# Patient Record
Sex: Female | Born: 1935 | Race: White | Hispanic: No | Smoking: Former smoker
Health system: Southern US, Community
[De-identification: ages and names within clinical notes are randomized; demographics above are authoritative.]

## PROBLEM LIST (undated history)

## (undated) DIAGNOSIS — N183 Chronic kidney disease, stage 3 unspecified: Secondary | ICD-10-CM

## (undated) DIAGNOSIS — M1990 Unspecified osteoarthritis, unspecified site: Secondary | ICD-10-CM

## (undated) DIAGNOSIS — R7303 Prediabetes: Secondary | ICD-10-CM

## (undated) DIAGNOSIS — E785 Hyperlipidemia, unspecified: Secondary | ICD-10-CM

## (undated) DIAGNOSIS — I10 Essential (primary) hypertension: Secondary | ICD-10-CM

## (undated) DIAGNOSIS — I639 Cerebral infarction, unspecified: Secondary | ICD-10-CM

## (undated) DIAGNOSIS — K219 Gastro-esophageal reflux disease without esophagitis: Secondary | ICD-10-CM

## (undated) DIAGNOSIS — I4891 Unspecified atrial fibrillation: Secondary | ICD-10-CM

## (undated) DIAGNOSIS — I5032 Chronic diastolic (congestive) heart failure: Secondary | ICD-10-CM

## (undated) HISTORY — PX: RHINOPLASTY: SUR1284

## (undated) HISTORY — DX: Cerebral infarction, unspecified: I63.9

## (undated) HISTORY — DX: Unspecified atrial fibrillation: I48.91

## (undated) HISTORY — DX: Prediabetes: R73.03

## (undated) HISTORY — DX: Chronic kidney disease, stage 3 (moderate): N18.3

## (undated) HISTORY — DX: Hyperlipidemia, unspecified: E78.5

## (undated) HISTORY — DX: Unspecified osteoarthritis, unspecified site: M19.90

## (undated) HISTORY — DX: Gastro-esophageal reflux disease without esophagitis: K21.9

## (undated) HISTORY — DX: Essential (primary) hypertension: I10

## (undated) HISTORY — PX: CHOLECYSTECTOMY: SHX55

## (undated) HISTORY — DX: Chronic kidney disease, stage 3 unspecified: N18.30

## (undated) HISTORY — PX: OTHER SURGICAL HISTORY: SHX169

---

## 2018-01-25 NOTE — Progress Notes (Signed)
Referring-Self referral Reason for referral-Atrial fibrillation  HPI: 84 yo female for evaluation of atrial fibrillation (self referral).  Echocardiogram January 2019 showed normal LV function, mild mitral regurgitation, moderate left atrial enlargement and mild tricuspid regurgitation.  Patient was off of Eliquis for hip injection in January 2019 and suffered an embolic CVA.  Patient denies dyspnea, chest pain, palpitations, syncope or bleeding.  She has some gait instability from previous CVA.  Current Outpatient Medications  Medication Sig Dispense Refill  . Acetaminophen (TYLENOL 8 HOUR PO) Take 650 mg by mouth 2 (two) times daily.    Marland Kitchen. apixaban (ELIQUIS) 5 MG TABS tablet Take 5 mg by mouth 2 (two) times daily.    Marland Kitchen. escitalopram (LEXAPRO) 20 MG tablet Take 20 mg by mouth daily.    Marland Kitchen. gabapentin (NEURONTIN) 100 MG capsule Take 200 mg by mouth 2 (two) times daily.    Marland Kitchen. losartan (COZAAR) 50 MG tablet Take 50 mg by mouth daily.    . metoprolol tartrate (LOPRESSOR) 50 MG tablet Take 50 mg by mouth 2 (two) times daily.    . Multiple Vitamin (MULTIVITAMIN) tablet Take 1 tablet by mouth daily.    . Probiotic Product (PROBIOTIC DAILY PO) Take 10 mg by mouth daily.    . ranitidine (ZANTAC) 150 MG tablet Take 150 mg by mouth daily.    . rosuvastatin (CRESTOR) 10 MG tablet Take 5 mg by mouth 2 (two) times a week.    . traMADol (ULTRAM) 50 MG tablet Take 50 mg by mouth daily.     No current facility-administered medications for this visit.     Allergies  Allergen Reactions  . Penicillins Anaphylaxis and Swelling    Local swelling, throat swelling Local swelling, throat swelling   . Sulfa Antibiotics Anaphylaxis and Hives    Hives Hives      Past Medical History:  Diagnosis Date  . Atrial fibrillation (HCC)   . CVA (cerebral vascular accident) (HCC)   . Hyperlipidemia   . Hypertension     Past Surgical History:  Procedure Laterality Date  . CHOLECYSTECTOMY      Social  History   Socioeconomic History  . Marital status: Widowed    Spouse name: Not on file  . Number of children: 3  . Years of education: Not on file  . Highest education level: Not on file  Occupational History  . Not on file  Social Needs  . Financial resource strain: Not on file  . Food insecurity:    Worry: Not on file    Inability: Not on file  . Transportation needs:    Medical: Not on file    Non-medical: Not on file  Tobacco Use  . Smoking status: Former Games developermoker  . Smokeless tobacco: Never Used  Substance and Sexual Activity  . Alcohol use: Not Currently  . Drug use: Not on file  . Sexual activity: Not on file  Lifestyle  . Physical activity:    Days per week: Not on file    Minutes per session: Not on file  . Stress: Not on file  Relationships  . Social connections:    Talks on phone: Not on file    Gets together: Not on file    Attends religious service: Not on file    Active member of club or organization: Not on file    Attends meetings of clubs or organizations: Not on file    Relationship status: Not on file  . Intimate partner violence:  Fear of current or ex partner: Not on file    Emotionally abused: Not on file    Physically abused: Not on file    Forced sexual activity: Not on file  Other Topics Concern  . Not on file  Social History Narrative  . Not on file    Family History  Problem Relation Age of Onset  . Stomach cancer Mother   . CAD Father     ROS: Hip pain but no fevers or chills, productive cough, hemoptysis, dysphasia, odynophagia, melena, hematochezia, dysuria, hematuria, rash, seizure activity, orthopnea, PND, pedal edema, claudication. Remaining systems are negative.  Physical Exam:   Blood pressure (!) 158/78, pulse (!) 58, height 4\' 11"  (1.499 m), weight 144 lb (65.3 kg).  General:  Well developed/well nourished in NAD Skin warm/dry Patient not depressed No peripheral clubbing Back-normal HEENT-normal/normal  eyelids Neck supple/normal carotid upstroke bilaterally; no bruits; no JVD; no thyromegaly chest - CTA/ normal expansion CV - irregular/normal S1 and S2; no murmurs, rubs or gallops;  PMI nondisplaced Abdomen -NT/ND, no HSM, no mass, + bowel sounds, no bruit 2+ femoral pulses, no bruits Ext-no edema, chords, 2+ DP Neuro-grossly nonfocal  ECG -atrial fibrillation at a rate of 58.  RV conduction delay.  Nonspecific ST changes.  Personally reviewed  A/P  1 permanent atrial fibrillation-plan to continue metoprolol for rate control.  Her heart rate is 58 and we may need to reduce the dose in the future.  Continue apixaban.  She had recent laboratories performed in November 2019.  Creatinine 1.17.  Hemoglobin 13.8.  Patient had an embolic CVA when off of Eliquis.  She also has hip pain.  She may require injections or hip replacement in the future.  We would need to consider Lovenox bridge if she needs these procedures.  2 hypertension-patient's blood pressure is elevated.  I have asked her to follow this and we will advance medications as needed.  3 hyperlipidemia-continue statin.  4 hip pain-we will leave to internal medicine.  As outlined above she requires procedures in the future she will probably need Lovenox bridge while off of apixaban.  Olga MillersBrian Crenshaw, MD

## 2018-02-04 ENCOUNTER — Ambulatory Visit: Payer: Medicare HMO | Admitting: Cardiology

## 2018-02-04 ENCOUNTER — Encounter: Payer: Self-pay | Admitting: Cardiology

## 2018-02-04 VITALS — BP 158/78 | HR 58 | Ht 59.0 in | Wt 144.0 lb

## 2018-02-04 DIAGNOSIS — I10 Essential (primary) hypertension: Secondary | ICD-10-CM

## 2018-02-04 DIAGNOSIS — I4821 Permanent atrial fibrillation: Secondary | ICD-10-CM | POA: Diagnosis not present

## 2018-02-04 DIAGNOSIS — E7800 Pure hypercholesterolemia, unspecified: Secondary | ICD-10-CM

## 2018-02-04 NOTE — Patient Instructions (Signed)
Medication Instructions:  NO CHANGE If you need a refill on your cardiac medications before your next appointment, please call your pharmacy.   Lab work: If you have labs (blood work) drawn today and your tests are completely normal, you will receive your results only by: Marland Kitchen. MyChart Message (if you have MyChart) OR . A paper copy in the mail If you have any lab test that is abnormal or we need to change your treatment, we will call you to review the results.  Follow-Up: At [facility], you and your health needs are our priority.  As part of our continuing mission to provide you with exceptional heart care, we have created designated Provider Care Teams.  These Care Teams include your primary Cardiologist (physician) and Advanced Practice Providers (APPs -  Physician Assistants and Nurse Practitioners) who all work together to provide you with the care you need, when you need it. You will need a follow up appointment in 6 months.  Please call our office 2 months in advance to schedule this appointment.  You WILL see Olga MillersBRIAN CRENSHAW MD  IN [place]

## 2018-02-06 NOTE — Addendum Note (Signed)
Addended by: Raelyn Number on: 02/06/2018 04:49 PM   Modules accepted: Orders

## 2018-03-19 ENCOUNTER — Ambulatory Visit (INDEPENDENT_AMBULATORY_CARE_PROVIDER_SITE_OTHER): Payer: Medicare HMO

## 2018-03-19 ENCOUNTER — Ambulatory Visit (INDEPENDENT_AMBULATORY_CARE_PROVIDER_SITE_OTHER): Payer: Medicare HMO | Admitting: Orthopaedic Surgery

## 2018-03-19 DIAGNOSIS — M25552 Pain in left hip: Secondary | ICD-10-CM | POA: Diagnosis not present

## 2018-03-19 DIAGNOSIS — M1611 Unilateral primary osteoarthritis, right hip: Secondary | ICD-10-CM | POA: Diagnosis not present

## 2018-03-19 DIAGNOSIS — M1990 Unspecified osteoarthritis, unspecified site: Secondary | ICD-10-CM | POA: Insufficient documentation

## 2018-03-19 DIAGNOSIS — M25551 Pain in right hip: Secondary | ICD-10-CM | POA: Diagnosis not present

## 2018-03-19 DIAGNOSIS — M169 Osteoarthritis of hip, unspecified: Secondary | ICD-10-CM | POA: Insufficient documentation

## 2018-03-19 MED ORDER — TRAMADOL HCL 50 MG PO TABS: 50.0000 mg | ORAL_TABLET | Freq: Four times a day (QID) | ORAL | 0 refills | Status: DC | PRN

## 2018-03-19 NOTE — Progress Notes (Signed)
Office Visit Note   Patient: Lorraine Page           Date of Birth: Oct 01, 1932           MRN: [id number] Visit Date: 03/19/2018              Requested by: Brooke Bonito, MD No address on file PCP: Brooke Bonito, MD   Assessment & Plan: Visit Diagnoses:  1. Pain in left hip   2. Pain in right hip   3. Unilateral primary osteoarthritis, right hip     Plan: She understands fully that she does have severe end-stage arthritis of her right hip and there is not really any good treatment options at this standpoint.  I would not even recommend an intra-articular injection given the significance of her arthritis and not seen intra-articular injections in this setting make a hip significantly worse with femoral head collapse.  Based on how severe her disease is on her right hip I do not feel that this is prudent.  I still feel that she is a candidate from my standpoint for hip replacement surgery.  I absolutely understand the hesitance for her and her family do want her to proceed with this.  They will think about this.  From my standpoint I would have her stop Eliquis 4 days before surgery and have her be bridged with Lovenox.  That way there is the potential for spinal anesthesia as well and anything we can do to minimize the impact of surgery on her.  I do feel this is a quality of life issue for her.  I would certainly also perform her surgery over at [facility] since she has a good relationship with her cardiologist and they would be able to check in on her if needed.  All question concerns were answered and addressed.  I would like to see her back in about a week so I can answer any more questions for her and we can have a better understanding for whether or not they would like [place] to proceed with surgery.  Follow-Up Instructions: Return in about 1 week (around 03/26/2018).   Orders:  Orders Placed This Encounter  Procedures  . XR HIPS BILAT W OR W/O PELVIS 2V   Meds ordered this  encounter  Medications  . traMADol (ULTRAM) 50 MG tablet    Sig: Take 1-2 tablets (50-100 mg total) by mouth every 6 (six) hours as needed.    Dispense:  40 tablet    Refill:  0      Procedures: No procedures performed   Clinical Data: No additional findings.   Subjective: Chief Complaint  Patient presents with  . Right Hip - Pain  Patient is a very pleasant 85 year old female that I am seeing for the first time.  She is been having right hip pain for some time now.  Her hip pain is actually getting significantly worse especially in the groin.  She does ambulate with a cane.  At this point her right hip pain is detrimentally affected her mobility, her quality of life and her activities daily living.  She does take tramadol for her pain.  Things have been slowly getting worse for her.  She does use a quad cane to ambulate but gets around minimally due to this pain.  Apparently she was going to get some type of injection in her hip and for some reason they stopped Eliquis.  She was off Eliquis for what she said was too long  and had a stroke.  Now it is 1 year later and she was to see if there is anything that she can do for her right hip.  Her family is with her today.  She also has a good relationship with her cardiologist and there is a family member who is a Advice worker for Dr. Excell Seltzer 1 of the cardiologist.  She also sees Dr. Olga Millers.  She said she is been told that she is not a surgical candidate due to her issues.  She is still on Eliquis daily.  HPI  Review of Systems She currently denies any headache, chest pain, shortness of breath, fever, chills, nausea, vomiting.  Objective: Vital Signs: There were no vitals taken for this visit.  Physical Exam She is alert and orient x3 and in no acute distress Ortho Exam Examination of her left hip just shows minimal pain but full rotation with no significant issues of the left hip.  The right hip has significant limitations  in rotation.  She has severe pain when put her through internal and external rotation of her right hip. Specialty Comments:  No specialty comments available.  Imaging: Xr Hips Bilat W Or W/o Pelvis 2v  Result Date: 03/19/2018 An AP pelvis and lateral of the right hip show severe end-stage arthritis of the right hip.  There is complete loss of the joint space.  There is significant flattening of the femoral head.  There is cystic changes in the femoral head and acetabulum as well as para-articular osteophytes.    PMFS History: Patient Active Problem List   Diagnosis Date Noted  . Unilateral primary osteoarthritis, right hip 03/19/2018   Past Medical History:  Diagnosis Date  . Atrial fibrillation (HCC)   . Chronic kidney disease, stage 3 (HCC)   . CVA (cerebral vascular accident) (HCC)   . GERD (gastroesophageal reflux disease)   . Hyperlipidemia   . Hypertension     Family History  Problem Relation Age of Onset  . Stomach cancer Mother   . CAD Father     Past Surgical History:  Procedure Laterality Date  . CHOLECYSTECTOMY     Social History   Occupational History  . Not on file  Tobacco Use  . Smoking status: Former Games developer  . Smokeless tobacco: Never Used  Substance and Sexual Activity  . Alcohol use: Not Currently  . Drug use: Not on file  . Sexual activity: Not on file

## 2018-03-25 ENCOUNTER — Telehealth: Payer: Self-pay | Admitting: Pharmacist Clinician (PhC)/ Clinical Pharmacy Specialist

## 2018-03-25 NOTE — Telephone Encounter (Signed)
FW: surgery  Received: Today  Message Contents  Freddi Starr, RN  P Cv Div Nl Anticoag      Previous Messages    ----- Message -----  From: Lewayne Bunting, MD  Sent: 03/25/2018  5:15 AM EST  To: Freddi Starr, RN, Beatrice Lecher, PA-C  Subject: RE: surgery                    Will arrange lovenox bridge. She didn't tell me about CP. Given spinal could probably proceed without nuc. Stanton Kidney can you arrange with pharmacy?  BC  ----- Message -----  From: Kennon Rounds  Sent: 03/23/2018  3:54 PM EST  To: Lewayne Bunting, MD  Subject: surgery                      Valente David  I know you are on vacation but I wasn't sure if you would be looking at your IB.  My mom saw Dr. Allie Bossier for her hip. She has pretty bad arthritis on that side and he did not have anything to offer her other than surgery.   He does an anterior approach for hip replacements and uses spinal anesthesia. He told us the procedure takes about an hour and patients are usually able to get up pretty quickly after b/c he doesn't cut through any muscle.   I talked to [facility] and he has had a lot of patients see Dr. Magnus Ivan that had a lot of comorbid illnesses and he said they did really well with this procedure. Dr. Magnus Ivan also said Jesusita Oka has had a lot of patients sent his way.    She is pretty miserable so I think it is worth it.  Dr. Magnus Ivan said anesthesia always asks for 4 days off anticoagulation for spinals.   # - Do you think we can set her up for Lovenox while she is off Eliquis through our [facility]? I could have her see Tresa Endo or Aundra Millet at [facility] since I am over there.   # - Also, do you think she needs a nuc? I would think that spinal anesthesia should lower her risk and it may not be necessary. She has had word finding issues since her stroke. She tells me sometimes she has chest pain but it sounds pretty atypical and I am not so sure she means that.      Thanks for your help.  Sorry about the long message.  Scott

## 2018-03-25 NOTE — Telephone Encounter (Signed)
Patient currently on Eliquis 5 mg bid (age 85, wt 65.3 kg, SCr 1.17 - dose appropriate)  CrCl 37.6  She can stop Eliquis 4 days prior to surgery and start enoxaparin 60 mg injections 12 hours later. Continue injections every 12 hours with last dose the morning prior to surgery (24 hrs prior to surgery).    Patient should be able to resume Eliquis day after surgery or at discretion of surgeon.  Please be sure patient is put back on full dose Eliquis and not orthopedic prophylactic dose

## 2018-03-26 NOTE — Telephone Encounter (Signed)
Spoke with Nicki Reaper, procedure date not set yet (she has not met with Dr Ninfa Linden yet). He will let us know when procedure date is scheduled and we will plan to see pt 1 week prior to procedure to coordinate Lovenox bridge.

## 2018-04-03 ENCOUNTER — Encounter (INDEPENDENT_AMBULATORY_CARE_PROVIDER_SITE_OTHER): Payer: Self-pay | Admitting: Orthopaedic Surgery

## 2018-04-03 ENCOUNTER — Ambulatory Visit (INDEPENDENT_AMBULATORY_CARE_PROVIDER_SITE_OTHER): Payer: Medicare HMO | Admitting: Orthopaedic Surgery

## 2018-04-03 DIAGNOSIS — M1611 Unilateral primary osteoarthritis, right hip: Secondary | ICD-10-CM

## 2018-04-03 NOTE — Progress Notes (Signed)
The patient is being seen today again but this time with her son-in-law who is a Advice worker with cardiology.  She is someone with severe debilitating end-stage arthritis of her right hip that is well-documented her x-rays.  We went over her x-rays again today showing the complete loss of the joint space of the right hip.  The left hip appears normal.  The right hip has severe cystic changes in the femoral head and the acetabulum with loss of the joint space completely.  Her pain is daily and it is detrimentally affected her mobility, her quality of life and her activities daily living.  It is 10 out of 10.  Even motion of that hip causes severe pain.  She is someone who is on Eliquis.  Her son-in-law is with her today as well.  We have now a plan with clearance through cardiology for the surgery.  When she has been off Eliquis in the past for procedure she was not bridged with Lovenox and unfortunately had a stroke about 2 days after being off Eliquis.  For this surgery we would have her bridged with Lovenox.  At this point they do feel is appropriate we will proceed with this surgery as do I.  We would need to do this on a Tuesday afternoon at [facility] since that is easier access to the cardiologist if needed.  We would have her stop her Eliquis on Friday before the Tuesday surgery and be bridged with Lovenox.  Her son-in-law stated that they can set this up through the pharmacist at the hospital as well.  We went over in detail with the surgery involves again.  We talked about interoperative and postoperative course and the risk and benefits involved.  Please refer to our last clinic note as well as it relates to this patient and her significant hip disease.  All question concerns were answered and addressed.  We will work on getting the surgery scheduled hopefully in the near future.

## 2018-04-11 NOTE — Telephone Encounter (Signed)
Procedure scheduled for 05/20/18. Will call to schedule appt to go over lovenox.

## 2018-04-15 NOTE — Telephone Encounter (Signed)
Spoke with patient's daughter and scheduled appt

## 2018-04-27 ENCOUNTER — Other Ambulatory Visit (INDEPENDENT_AMBULATORY_CARE_PROVIDER_SITE_OTHER): Payer: Self-pay | Admitting: Orthopaedic Surgery

## 2018-04-28 NOTE — Telephone Encounter (Signed)
Please advise 

## 2018-05-12 ENCOUNTER — Ambulatory Visit: Payer: Medicare HMO

## 2018-05-26 ENCOUNTER — Other Ambulatory Visit (INDEPENDENT_AMBULATORY_CARE_PROVIDER_SITE_OTHER): Payer: Self-pay | Admitting: Orthopaedic Surgery

## 2018-05-26 NOTE — Telephone Encounter (Signed)
Please advise 

## 2018-06-03 ENCOUNTER — Encounter: Payer: Self-pay | Admitting: Cardiology

## 2018-06-03 NOTE — Telephone Encounter (Signed)
New message   Patient returning phone call about appointment please call her back

## 2018-06-04 NOTE — Telephone Encounter (Signed)
This encounter was created in error - please disregard.

## 2018-06-04 NOTE — Telephone Encounter (Signed)
Left message for pt to call.

## 2018-06-09 ENCOUNTER — Other Ambulatory Visit (INDEPENDENT_AMBULATORY_CARE_PROVIDER_SITE_OTHER): Payer: Self-pay | Admitting: Orthopaedic Surgery

## 2018-06-09 NOTE — Telephone Encounter (Signed)
Please advise 

## 2018-06-16 ENCOUNTER — Other Ambulatory Visit (INDEPENDENT_AMBULATORY_CARE_PROVIDER_SITE_OTHER): Payer: Self-pay | Admitting: Orthopaedic Surgery

## 2018-06-16 NOTE — Telephone Encounter (Signed)
Please advise 

## 2018-06-30 ENCOUNTER — Other Ambulatory Visit (INDEPENDENT_AMBULATORY_CARE_PROVIDER_SITE_OTHER): Payer: Self-pay | Admitting: Orthopaedic Surgery

## 2018-07-01 NOTE — Telephone Encounter (Signed)
Please advise 

## 2018-07-08 ENCOUNTER — Telehealth: Payer: Self-pay | Admitting: *Deleted

## 2018-07-08 NOTE — Telephone Encounter (Signed)
There is not a higher dose.  We can send in more.  Can not send it in until later this week.  Call back as a reminder.

## 2018-07-08 NOTE — Telephone Encounter (Signed)
Please advise 

## 2018-07-08 NOTE — Telephone Encounter (Signed)
Pt is taking tramadol 100mg  in the morning and 100mg  in the evening. She is running out of medication in 10 days. Pt daughter is wondering if we can either increase the amount of mg or call in more medication. Pharmacy is [facility].

## 2018-07-10 ENCOUNTER — Other Ambulatory Visit: Payer: Self-pay | Admitting: Orthopaedic Surgery

## 2018-07-10 MED ORDER — TRAMADOL HCL 50 MG PO TABS: 50.0000 mg | ORAL_TABLET | Freq: Four times a day (QID) | ORAL | 0 refills | Status: DC | PRN

## 2018-07-10 NOTE — Telephone Encounter (Signed)
Needs refill of Tramadol  The below post was supposed to say "it only last 10 days" Daughter states she is in a lot of pain and is very ready and anxious to get replacement done

## 2018-07-10 NOTE — Telephone Encounter (Signed)
I did send in some more tramadol.  See if you can find out from Consuello Bossier about getting this patient scheduled for hip replacement.  She can look at my last note from February.  I probably already filled a schedule sheet for her as well and its somehow in Sherry's system.

## 2018-07-11 NOTE — Telephone Encounter (Signed)
I called daughter and advised tramadol was sent to the pharmacy.   Cheryl-please see message from Dr. Magnus Ivan below.

## 2018-07-14 NOTE — Telephone Encounter (Signed)
Lorraine Page, you have surgery order for Lorraine Page.

## 2018-07-17 ENCOUNTER — Telehealth: Payer: Self-pay

## 2018-07-17 NOTE — Telephone Encounter (Signed)
Virtual Visit Pre-Appointment Phone Call  "(Name), I am calling you today to discuss your upcoming appointment. We are currently trying to limit exposure to the virus that causes COVID-19 by seeing patients at home rather than in the office."  1. "What is the BEST phone number to call the day of the visit?" - include this in appointment notes  2. "Do you have or have access to (through a family member/friend) a smartphone with video capability that we can use for your visit?" a. If yes - list this number in appt notes as "cell" (if different from BEST phone #) and list the appointment type as a VIDEO visit in appointment notes b. If no - list the appointment type as a PHONE visit in appointment notes  3. Confirm consent - "In the setting of the current Covid19 crisis, you are scheduled for a (phone or video) visit with your provider on (date) at (time).  Just as we do with many in-office visits, in order for you to participate in this visit, we must obtain consent.  If you'd like, I can send this to your mychart (if signed up) or email for you to review.  Otherwise, I can obtain your verbal consent now.  All virtual visits are billed to your insurance company just like a normal visit would be.  By agreeing to a virtual visit, we'd like you to understand that the technology does not allow for your provider to perform an examination, and thus may limit your provider's ability to fully assess your condition. If your provider identifies any concerns that need to be evaluated in person, we will make arrangements to do so.  Finally, though the technology is pretty good, we cannot assure that it will always work on either your or our end, and in the setting of a video visit, we may have to convert it to a phone-only visit.  In either situation, we cannot ensure that we have a secure connection.  Are you willing to proceed?" STAFF: Did the patient verbally acknowledge consent to telehealth visit? Document  YES/NO here: YES  4. Advise patient to be prepared - "Two hours prior to your appointment, go ahead and check your blood pressure, pulse, oxygen saturation, and your weight (if you have the equipment to check those) and write them all down. When your visit starts, your provider will ask you for this information. If you have an Apple Watch or Kardia device, please plan to have heart rate information ready on the day of your appointment. Please have a pen and paper handy nearby the day of the visit as well."  5. Give patient instructions for MyChart download to smartphone OR Doximity/Doxy.me as below if video visit (depending on what platform provider is using)  6. Inform patient they will receive a phone call 15 minutes prior to their appointment time (may be from unknown caller ID) so they should be prepared to answer    TELEPHONE CALL NOTE  Lorraine Page has been deemed a candidate for a follow-up tele-health visit to limit community exposure during the Covid-19 pandemic. I spoke with the patient via phone to ensure availability of phone/video source, confirm preferred email & phone number, and discuss instructions and expectations.  I reminded Lorraine Page to be prepared with any vital sign and/or heart rhythm information that could potentially be obtained via home monitoring, at the time of her visit. I reminded Lorraine Page to expect a phone call prior to her visit.  Lorraine Page, CMA 07/17/2018 10:54 AM   INSTRUCTIONS FOR DOWNLOADING THE MYCHART APP TO SMARTPHONE  - The patient must first make sure to have activated MyChart and know their login information - If Apple, go to [employer] Store and type in MyChart in the search bar and download the app. If Android, ask patient to go to [employer] Play Store and type in [place] in the search bar and download the app. The app is free but as with any other app downloads, their phone may require them to verify saved payment information or  Apple/Android password.  - The patient will need to then log into the app with their MyChart username and password, and select [place] as their healthcare provider to link the account. When it is time for your visit, go to the MyChart app, find appointments, and click Begin Video Visit. Be sure to Select Allow for your device to access the Microphone and Camera for your visit. You will then be connected, and your provider will be with you shortly.  **If they have any issues connecting, or need assistance please contact MyChart service desk [phone])**  **If using a computer, in order to ensure the best quality for their visit they will need to use either of the following Internet Browsers: [employer] Edge, or Google Chrome**  IF USING DOXIMITY or DOXY.ME - The patient will receive a link just prior to their visit by text.     FULL LENGTH CONSENT FOR TELE-HEALTH VISIT   I hereby voluntarily request, consent and authorize [facility] and its employed or contracted physicians, physician assistants, nurse practitioners or other licensed health care professionals (the Practitioner), to provide me with telemedicine health care services (the "Services") as deemed necessary by the treating Practitioner. I acknowledge and consent to receive the Services by the Practitioner via telemedicine. I understand that the telemedicine visit will involve communicating with the Practitioner through live audiovisual communication technology and the disclosure of certain medical information by electronic transmission. I acknowledge that I have been given the opportunity to request an in-person assessment or other available alternative prior to the telemedicine visit and am voluntarily participating in the telemedicine visit.  I understand that I have the right to withhold or withdraw my consent to the use of telemedicine in the course of my care at any time, without affecting my right to future care  or treatment, and that the Practitioner or I may terminate the telemedicine visit at any time. I understand that I have the right to inspect all information obtained and/or recorded in the course of the telemedicine visit and may receive copies of available information for a reasonable fee.  I understand that some of the potential risks of receiving the Services via telemedicine include:  Marland Kitchen. Delay or interruption in medical evaluation due to technological equipment failure or disruption; . Information transmitted may not be sufficient (e.g. poor resolution of images) to allow for appropriate medical decision making by the Practitioner; and/or  . In rare instances, security protocols could fail, causing a breach of personal health information.  Furthermore, I acknowledge that it is my responsibility to provide information about my medical history, conditions and care that is complete and accurate to the best of my ability. I acknowledge that Practitioner's advice, recommendations, and/or decision may be based on factors not within their control, such as incomplete or inaccurate data provided by me or distortions of diagnostic images or specimens that may result from electronic transmissions. I understand that the  practice of medicine is not an Chief Strategy Officer and that Practitioner makes no warranties or guarantees regarding treatment outcomes. I acknowledge that I will receive a copy of this consent concurrently upon execution via email to the email address I last provided but may also request a printed copy by calling the office of [place].    I understand that my insurance will be billed for this visit.   I have read or had this consent read to me. . I understand the contents of this consent, which adequately explains the benefits and risks of the Services being provided via telemedicine.  . I have been provided ample opportunity to ask questions regarding this consent and the Services and have had  my questions answered to my satisfaction. . I give my informed consent for the services to be provided through the use of telemedicine in my medical care  By participating in this telemedicine visit I agree to the above.

## 2018-07-22 NOTE — Progress Notes (Signed)
Virtual Visit via telephone Note   This visit type was conducted due to national recommendations for restrictions regarding the COVID-19 Pandemic (e.g. social distancing) in an effort to limit this patient's exposure and mitigate transmission in our community.  Due to her co-morbid illnesses, this patient is at least at moderate risk for complications without adequate follow up.  This format is felt to be most appropriate for this patient at this time.  All issues noted in this document were discussed and addressed.  A limited physical exam was performed with this format.  Please refer to the patient's chart for her consent to telehealth for [facility].   Date:  07/23/2018   ID:  Lorraine Page, DOB 01/09/1933, MRN [id number]  Patient Location: Home Provider Location: Home  PCP:  Reita Cliche, MD  Cardiologist:  Dr Stanford Breed  Evaluation Performed:  Follow-Up Visit  Chief Complaint:  FU atrial fibrillation  History of Present Illness:    FU atrial fibrillation.  Echocardiogram January 2019 showed normal LV function, mild mitral regurgitation, moderate left atrial enlargement and mild tricuspid regurgitation.  Patient was off of Eliquis for hip injection in January 2019 and suffered an embolic CVA. She has some gait instability from previous CVA. Since last seen, patient denies dyspnea, chest pain, syncope or bleeding.  She has significant mobility problems from hip pain.  The patient does not have symptoms concerning for COVID-19 infection (fever, chills, cough, or new shortness of breath).    Past Medical History:  Diagnosis Date  . Atrial fibrillation ([place])   . Chronic kidney disease, stage 3 ([place])   . CVA (cerebral vascular accident) ([employer])   . GERD (gastroesophageal reflux disease)   . Hyperlipidemia   . Hypertension    Past Surgical History:  Procedure Laterality Date  . CHOLECYSTECTOMY       Current Meds  Medication Sig  . Acetaminophen (TYLENOL 8 HOUR PO) Take  650 mg by mouth 2 (two) times daily.  Marland Kitchen apixaban (ELIQUIS) 5 MG TABS tablet Take 5 mg by mouth 2 (two) times daily.  . Cimetidine (TAGAMET HB PO) Take 200 mg by mouth daily.  Marland Kitchen escitalopram (LEXAPRO) 20 MG tablet Take 20 mg by mouth daily.  Marland Kitchen losartan (COZAAR) 50 MG tablet Take 50 mg by mouth daily.  . metoprolol tartrate (LOPRESSOR) 50 MG tablet Take 50 mg by mouth 2 (two) times daily.  . Multiple Vitamin (MULTIVITAMIN) tablet Take 1 tablet by mouth daily.  . Probiotic Product (PROBIOTIC DAILY PO) Take 10 mg by mouth daily.  . rosuvastatin (CRESTOR) 10 MG tablet Take 5 mg by mouth 2 (two) times a week.  . traMADol (ULTRAM) 50 MG tablet Take 1-2 tablets (50-100 mg total) by mouth every 6 (six) hours as needed. for pain     Allergies:   Penicillins and Sulfa antibiotics   Social History   Tobacco Use  . Smoking status: Former Research scientist (life sciences)  . Smokeless tobacco: Never Used  Substance Use Topics  . Alcohol use: Not Currently  . Drug use: Not on file     Family Hx: The patient's family history includes CAD in her father; Stomach cancer in her mother.  ROS:   Please see the history of present illness.    No fevers, chills or productive cough. All other systems reviewed and are negative.   Wt Readings from Last 3 Encounters:  07/23/18 142 lb (64.4 kg)  02/04/18 144 lb (65.3 kg)     Objective:    Vital Signs:  BP (!) 148/92   Pulse 65   Ht 4\' 11"  (1.499 m)   Wt 142 lb (64.4 kg)   BMI 28.68 kg/m    VITAL SIGNS:  reviewed  No acute distress Answers questions appropriately Normal affect Remainder physical examination not performed (telehealth visit; coronavirus pandemic)  ASSESSMENT & PLAN:    1. Permanent atrial fibrillation-we will continue with present dose of beta-blocker for rate control.  Continue apixaban.  Note she did have a CVA when her apixaban was discontinued previously.  As outlined in previous notes we will plan Lovenox bridge for her upcoming hip surgery and any  procedures in the future while off of Eliquis.  I will obtain most recent CBC and creatinine from primary care. 2. Hypertension-blood pressure is elevated; increase losartan to 100 mg daily.  Follow blood pressure and adjust regimen as needed.  Check potassium and renal function in 1 week. 3. Hyperlipidemia-continue statin. 4. Hip pain-patient is scheduled for hip replacement in the near future.  Will need Lovenox bridge while off of apixaban as outlined above.  COVID-19 Education: The importance of social distancing was discussed today.  Time:   Today, I have spent 17 minutes with the patient with telehealth technology discussing the above problems.     Medication Adjustments/Labs and Tests Ordered: Current medicines are reviewed at length with the patient today.  Concerns regarding medicines are outlined above.   Tests Ordered: No orders of the defined types were placed in this encounter.   Medication Changes: No orders of the defined types were placed in this encounter.   Follow Up:  Virtual Visit or In Person in 6 month(s)  Signed, Olga MillersBrian Crenshaw, MD  07/23/2018 12:13 PM    [facility]

## 2018-07-23 ENCOUNTER — Telehealth: Payer: Self-pay | Admitting: Cardiology

## 2018-07-23 ENCOUNTER — Encounter: Payer: Self-pay | Admitting: Cardiology

## 2018-07-23 ENCOUNTER — Telehealth (INDEPENDENT_AMBULATORY_CARE_PROVIDER_SITE_OTHER): Payer: Medicare HMO | Admitting: Cardiology

## 2018-07-23 ENCOUNTER — Other Ambulatory Visit: Payer: Self-pay

## 2018-07-23 VITALS — BP 148/92 | HR 65 | Ht 59.0 in | Wt 142.0 lb

## 2018-07-23 DIAGNOSIS — E7800 Pure hypercholesterolemia, unspecified: Secondary | ICD-10-CM

## 2018-07-23 DIAGNOSIS — I4821 Permanent atrial fibrillation: Secondary | ICD-10-CM

## 2018-07-23 DIAGNOSIS — I10 Essential (primary) hypertension: Secondary | ICD-10-CM | POA: Diagnosis not present

## 2018-07-23 MED ORDER — LOSARTAN POTASSIUM 100 MG PO TABS: 100.0000 mg | ORAL_TABLET | Freq: Every day | ORAL | 3 refills | Status: DC

## 2018-07-23 NOTE — Patient Instructions (Signed)
Medication Instructions:   INCREASE LOSARTAN 100 MG ONCE DAILY= 2 OF THE 50 MG TABLETS ONCE DAILY  If you need a refill on your cardiac medications before your next appointment, please call your pharmacy.   Lab work: Your physician recommends that you return for lab work in: [place]   If you have labs (blood work) drawn today and your tests are completely normal, you will receive your results only by: Marland Kitchen MyChart Message (if you have MyChart) OR . A paper copy in the mail If you have any lab test that is abnormal or we need to change your treatment, we will call you to review the results.  Follow-Up: At [facility], you and your health needs are our priority.  As part of our continuing mission to provide you with exceptional heart care, we have created designated Provider Care Teams.  These Care Teams include your primary Cardiologist (physician) and Advanced Practice Providers (APPs -  Physician Assistants and Nurse Practitioners) who all work together to provide you with the care you need, when you need it. You will need a follow up appointment in 6 months.  Please call our office 2 months in advance to schedule this appointment.  You may see Kirk Ruths MD or one of the following Advanced Practice Providers on your designated Care Team:   Kerin Ransom, PA-C Roby Lofts, [place] . Sande Rives, PA-C

## 2018-07-23 NOTE — Telephone Encounter (Signed)
° °  [facility] Pre-operative Risk Assessment    Request for surgical clearance:  1. What type of surgery is being performed? Total hip replacement of R hip  2. When is this surgery scheduled? 08/12/18   3. What type of clearance is required (medical clearance vs. Pharmacy clearance to hold med vs. Both)?  pharmacy  4. Are there any medications that need to be held prior to surgery and how long? Patient will need a lovenox bridge  5. Practice name and name of physician performing surgery? Zollie Beckers, [place]  6. What is your office phone number: [phone]   7.   What is your office fax number: [phone]  8.   Anesthesia type (None, local, MAC, general) ?: spinal   Lorraine Page 07/23/2018, 4:26 PM  _________________________________________________________________   (provider comments below)

## 2018-07-23 NOTE — Telephone Encounter (Signed)
Spoke with daughter and rescheduled surgery for 08/12/18.  Called Dr. Jacalyn Lefevre office and left message with surgery information so that Lovenox bridge can be arranged.

## 2018-07-24 ENCOUNTER — Other Ambulatory Visit: Payer: Self-pay

## 2018-07-24 ENCOUNTER — Other Ambulatory Visit: Payer: Self-pay | Admitting: Physician Assistant

## 2018-07-24 NOTE — Telephone Encounter (Signed)
Patient with diagnosis of afib on Eliquis for anticoagulation.    Procedure: Total hip replacement of R hip Date of procedure: 08/12/2018  CHADS2-VASc score of 6  (CHF, HTN, AGE, DM2, stroke/tia x 2, CAD, AGE, female)  Per office protocol, patient can hold Elqiuis for 3 days prior to procedure.   Due to CVA off Eliquis in the past, patient will need lovenox bridge.  Patient will need a BMP and CBC prior to coordinating this.

## 2018-07-24 NOTE — Telephone Encounter (Signed)
   Primary Cardiologist: No primary care provider on file.  Chart reviewed as part of pre-operative protocol coverage. Given past medical history and time since last visit, based on ACC/AHA guidelines, Lorraine Page would be at acceptable risk for the planned procedure without further cardiovascular testing. Pt was seen by Dr. Stanford Breed 07/23/2018 for follow up and surgery was dicussed. It was noted per chart review that the patient did experience a CVA when her apixaban was discontinued previously.  As outlined in previous notes we will plan Lovenox bridge for her upcoming hip surgery and any procedures in the future while off of Eliquis.   Per pharmacy: Patient with diagnosis of afib on Eliquis for anticoagulation.    Procedure: Total hip replacement of R hip Date of procedure: 08/12/2018  CHADS2-VASc score of 6  (CHF, HTN, AGE, DM2, stroke/tia x 2, CAD, AGE, female)  Per office protocol, patient can hold Elqiuis for 3 days prior to procedure. Due to CVA off Eliquis in the past, patient will need lovenox bridge. Patient will need a BMP and CBC prior to coordinating this.  I will route this recommendation to the requesting party via Epic fax function and remove from pre-op pool.  Please call with questions.  Kathyrn Drown, NP 07/24/2018, 4:35 PM

## 2018-07-30 ENCOUNTER — Telehealth: Payer: Self-pay | Admitting: Pharmacist

## 2018-07-30 NOTE — Telephone Encounter (Addendum)
Referred by Richardson Dopp to coordinate Lovenox bridging for his mother prior to upcoming [place] on 08/12/18. She takes Eliquis but previously developed a stroke when she interrupted anticoagulation therapy.  Labs checked at PCP on 12/13/17: SCr 1.17, Hgb 13.8, plt 205  Actual body weight 64.5kg Ideal body weight 45kg Adjusted body weight 53kg - use this weight for CrCl. CrCl is 30.22mL/min - pt is borderline between needing 1mg /kg BID dosing or 1mg /kg once daily dosing for CrCl < 30.  Repeat BMET/CBC drawn on 6/30 for preop labs in order to calculate appropriate Lovenox frequency: CBC stable, SCr 1.04, CrCl using adjusted body weight = 35mL/min. Pt appropriate for 1mg k/kg BID dosing - this Lovenox dosing uses actual body weight - will dose at 60mg /kg BID.  7/3 - Last dose of Eliquis in PM 7/4 - Inject Lovenox 60mg  subcutaneously into abdominal tissue at 8AM and 8PM 7/5 - Inject Lovenox 60mg  at 8AM and 8PM 7/6 - Inject Lovenox 60mg  at 8AM. Do not administer PM dose. 7/7 - Procedure day - no anticoagulation 7/8 - Resume Eliquis at discretion of doctor  Called patient's daughter, Tye [place] [facility]) and provided above Lovenox  Instructions. Rx sent to pharmacy and advised her to call with any questions.

## 2018-07-31 ENCOUNTER — Telehealth: Payer: Self-pay | Admitting: Orthopaedic Surgery

## 2018-07-31 NOTE — Telephone Encounter (Signed)
Melissa from [facility] called wanted to advise needs labs to coordinate bridge perhaps these may be done in pre-op but wants a call [phone] BNP & CBC

## 2018-08-01 NOTE — Telephone Encounter (Signed)
The labs should be done at her pre-op appointment.  Please coordinate with Sherrie as well.

## 2018-08-01 NOTE — Telephone Encounter (Signed)
I called [phone]  and left a voice mail for a return call.  Put in orders for CBC and BMET.

## 2018-08-01 NOTE — Telephone Encounter (Signed)
Please advise 

## 2018-08-01 NOTE — Telephone Encounter (Signed)
See below

## 2018-08-04 ENCOUNTER — Other Ambulatory Visit (INDEPENDENT_AMBULATORY_CARE_PROVIDER_SITE_OTHER): Payer: Self-pay | Admitting: Orthopaedic Surgery

## 2018-08-04 NOTE — Pre-Procedure Instructions (Signed)
[facility] - [place], [place] - [id number] [place] [id number] Phone: [phone] Fax: [phone]      Your procedure is scheduled on 08-12-18 Tuesday  Report to [facility] Main Entrance "A" at 1215 P.M., and check in at the Admitting office.  Call this number if you have problems the morning of surgery:  [phone]  Call [phone] if you have any questions prior to your surgery date Monday-Friday 8am-4pm    Remember:  Do not eat after midnight.  You may drink clear liquids until 1115AM.   Clear liquids allowed are: Water, Non-Citrus Juices (without pulp), Carbonated Beverages, Clear Tea, Black Coffee Only, and Gatorade   Please complete your PRE-SURGERY ENSURE that was provided to you 3 hours prior to you surgery start time.  Please, if able, drink it in one setting. DO NOT SIP.  Take these medicines the morning of surgery with A SIP OF WATER : acetaminophen (TYLENOL 8 HOUR) cimetidine (TAGAMET HB) escitalopram (LEXAPRO) metoprolol tartrate (LOPRESSOR) rosuvastatin (CRESTOR)if scheduled traMADol (ULTRAM)as needed 7 days prior to surgery STOP taking any Aspirin (unless otherwise instructed by your surgeon), Aleve, Naproxen, Ibuprofen, Motrin, Advil, Goody's, BC's, all herbal medications, fish oil, and all vitamins.  Follow your surgeon's instructions on when to stop ELIQUIS.  If no instructions were given by your surgeon then you will need to call the office to get those instructions.     The Morning of Surgery  Do not wear jewelry, make-up or nail polish.  Do not wear lotions, powders, or perfumes/colognes, or deodorant  Do not shave 48 hours prior to surgery.  Men may shave face and neck.  Do not bring valuables to the hospital.  [facility] is not responsible for any belongings or valuables.  If you are a smoker, DO NOT Smoke 24 hours prior to surgery IF you wear a CPAP at night please bring your mask, tubing, and machine the  morning of surgery   Remember that you must have someone to transport you home after your surgery, and remain with you for 24 hours if you are discharged the same day.   Contacts, glasses, hearing aids, dentures or bridgework may not be worn into surgery.    Leave your suitcase in the car.  After surgery it may be brought to your room.  For patients admitted to the hospital, discharge time will be determined by your treatment team.  Patients discharged the day of surgery will not be allowed to drive home.    Special instructions:   [place]- Preparing For Surgery  Before surgery, you can play an important role. Because skin is not sterile, your skin needs to be as free of germs as possible. You can reduce the number of germs on your skin by washing with CHG (chlorahexidine gluconate) Soap before surgery.  CHG is an antiseptic cleaner which kills germs and bonds with the skin to continue killing germs even after washing.    Oral Hygiene is also important to reduce your risk of infection.  Remember - BRUSH YOUR TEETH THE MORNING OF SURGERY WITH YOUR REGULAR TOOTHPASTE  Please do not use if you have an allergy to CHG or antibacterial soaps. If your skin becomes reddened/irritated stop using the CHG.  Do not shave (including legs and underarms) for at least 48 hours prior to first CHG shower. It is OK to shave your face.  Please follow these instructions carefully.   1. Shower the NIGHT BEFORE  SURGERY and the MORNING OF SURGERY with CHG Soap.   2. If you chose to wash your hair, wash your hair first as usual with your normal shampoo.  3. After you shampoo, rinse your hair and body thoroughly to remove the shampoo.  4. Use CHG as you would any other liquid soap. You can apply CHG directly to the skin and wash gently with a scrungie or a clean washcloth.   5. Apply the CHG Soap to your body ONLY FROM THE NECK DOWN.  Do not use on open wounds or open sores. Avoid contact with your  eyes, ears, mouth and genitals (private parts). Wash Face and genitals (private parts)  with your normal soap.   6. Wash thoroughly, paying special attention to the area where your surgery will be performed.  7. Thoroughly rinse your body with warm water from the neck down.  8. DO NOT shower/wash with your normal soap after using and rinsing off the CHG Soap.  9. Pat yourself dry with a CLEAN TOWEL.  10. Wear CLEAN PAJAMAS to bed the night before surgery, wear comfortable clothes the morning of surgery  11. Place CLEAN SHEETS on your bed the night of your first shower and DO NOT SLEEP WITH PETS.    Day of Surgery:  Do not apply any deodorants/lotions.  Please wear clean clothes to the hospital/surgery center.   Remember to brush your teeth WITH YOUR REGULAR TOOTHPASTE.   Please read over the following fact sheets that you were given.

## 2018-08-04 NOTE — Telephone Encounter (Signed)
Please advise 

## 2018-08-05 ENCOUNTER — Encounter (HOSPITAL_COMMUNITY): Payer: Self-pay

## 2018-08-05 ENCOUNTER — Encounter (HOSPITAL_COMMUNITY)
Admission: RE | Admit: 2018-08-05 | Discharge: 2018-08-05 | Disposition: A | Discharge status: Home / Self Care | Payer: Medicare HMO | Source: Ambulatory Visit | Attending: Orthopaedic Surgery | Admitting: Orthopaedic Surgery

## 2018-08-05 ENCOUNTER — Other Ambulatory Visit: Payer: Self-pay

## 2018-08-05 DIAGNOSIS — Z01812 Encounter for preprocedural laboratory examination: Secondary | ICD-10-CM | POA: Insufficient documentation

## 2018-08-05 DIAGNOSIS — Z1159 Encounter for screening for other viral diseases: Secondary | ICD-10-CM | POA: Diagnosis not present

## 2018-08-05 LAB — SURGICAL PCR SCREEN
MRSA, PCR: NEGATIVE
Staphylococcus aureus: NEGATIVE

## 2018-08-05 LAB — BASIC METABOLIC PANEL
Anion gap: 10 (ref 5–15)
BUN: 15 mg/dL (ref 8–23)
CO2: 24 mmol/L (ref 22–32)
Calcium: 9.1 mg/dL (ref 8.9–10.3)
Chloride: 106 mmol/L (ref 98–111)
Creatinine, Ser: 1.04 mg/dL — ABNORMAL HIGH (ref 0.44–1.00)
GFR calc Af Amer: 58 mL/min — ABNORMAL LOW (ref >60)
GFR calc non Af Amer: 50 mL/min — ABNORMAL LOW (ref >60)
Glucose, Bld: 90 mg/dL (ref 70–99)
Potassium: 4.2 mmol/L (ref 3.5–5.1)
Sodium: 140 mmol/L (ref 135–145)

## 2018-08-05 LAB — CBC
HCT: 42.2 % (ref 36.0–46.0)
Hemoglobin: 13.4 g/dL (ref 12.0–15.0)
MCH: 30 pg (ref 26.0–34.0)
MCHC: 31.8 g/dL (ref 30.0–36.0)
MCV: 94.6 fL (ref 80.0–100.0)
Platelets: 205 10*3/uL (ref 150–400)
RBC: 4.46 MIL/uL (ref 3.87–5.11)
RDW: 13.2 % (ref 11.5–15.5)
WBC: 6.6 10*3/uL (ref 4.0–10.5)
nRBC: 0 % (ref 0.0–0.2)

## 2018-08-05 MED ORDER — ENOXAPARIN SODIUM 60 MG/0.6ML ~~LOC~~ SOLN: 60.0000 mg | Freq: Two times a day (BID) | SUBCUTANEOUS | 0 refills | Status: DC

## 2018-08-05 NOTE — Progress Notes (Signed)
PCP - Reita Cliche, MD Cardiologist - Kirk Ruths, MD  Chest x-ray - pt denies EKG - 01/2018 -requested from Dr. Stanford Breed  Stress Test - pt denies ECHO - 02/2017 in EPIC  Cardiac Cath - pt denies  Sleep Study - n/a CPAP - n/a  Fasting Blood Sugar - n/a Checks Blood Sugar _____ times a day-n/a  Blood Thinner Instructions: Eliquis-last dose 08/08/2018 and then start lovenox bridge, Dr. Waiting on pre-op labs to determine bridge dosage per notes Aspirin Instructions: n/a  Anesthesia review: Yes, heart hx  Patient denies shortness of breath, fever, cough and chest pain at PAT appointment  Patient verbalized understanding of instructions that were given to them at the PAT appointment. Patient was also instructed that they will need to review over the PAT instructions again at home before surgery.

## 2018-08-06 NOTE — Progress Notes (Addendum)
Dr. Jacalyn Lefevre office called, fax requested yesterday by PAT nurse for EKG 01/2018, most recent EKG 02/2017. EKG needed DOS.  Update: EKG scan dated 01/2018, no EKG needed DOS. Epic incorrectly dated

## 2018-08-06 NOTE — Anesthesia Preprocedure Evaluation (Addendum)
Anesthesia Evaluation  Patient identified by MRN, date of birth, ID band Patient awake    Reviewed: Allergy & Precautions, H&P , NPO status , Patient's Chart, lab work & pertinent test results, reviewed documented beta blocker date and time   Airway Mallampati: II  TM Distance: >3 FB Neck ROM: Full    Dental no notable dental hx. (+) Teeth Intact, Dental Advisory Given   Pulmonary neg pulmonary ROS, former smoker,    Pulmonary exam normal breath sounds clear to auscultation       Cardiovascular hypertension, Pt. on medications, On Home Beta Blockers and Pt. on home beta blockers negative cardio ROS  + dysrhythmias Atrial Fibrillation  Rhythm:Regular Rate:Normal     Neuro/Psych CVA, Residual Symptoms negative neurological ROS  negative psych ROS   GI/Hepatic Neg liver ROS, GERD  ,  Endo/Other  negative endocrine ROS  Renal/GU Renal InsufficiencyRenal diseasenegative Renal ROS  negative genitourinary   Musculoskeletal  (+) Arthritis , Osteoarthritis,    Abdominal   Peds  Hematology negative hematology ROS (+)   Anesthesia Other Findings   Reproductive/Obstetrics negative OB ROS                           Anesthesia Physical Anesthesia Plan  ASA: III  Anesthesia Plan: Spinal   Post-op Pain Management:    Induction: Intravenous  PONV Risk Score and Plan: 3 and Ondansetron and Propofol infusion  Airway Management Planned: Simple Face Mask  Additional Equipment:   Intra-op Plan:   Post-operative Plan:   Informed Consent: I have reviewed the patients History and Physical, chart, labs and discussed the procedure including the risks, benefits and alternatives for the proposed anesthesia with the patient or authorized representative who has indicated his/her understanding and acceptance.     Dental advisory given  Plan Discussed with: CRNA  Anesthesia Plan Comments: (Follows with  Dr. Stanford Breed for permanent Afib. She had a CVA in 2019 when off apixaban for a hip injection and continues to have some expressive aphasia/word finding difficulty, daughter will accompany her on DOS. Last seen by Dr. Stanford Breed 07/23/18. Surgery was discussed. "Note she did have a CVA when her apixaban was discontinued previously.  As outlined in previous notes we will plan Lovenox bridge for her upcoming hip surgery and any procedures in the future while off of Eliquis." She is on a Lovenox bridge per pharmacy.   Note, EKG in Epic is labeled as being from 02/2017, tracing is actually from 01/2018 and shows rate controlled Afib.   TTE 02/06/17 Summary Normal left ventricular size and systolic function with no appreciable segmental abnormality. Ejection fraction is visually estimated at 55-60% Mild mitral annular calcification with Mild mitral regurgitation. Moderate dilated left atrium Mild tricuspid regurgitation . RVSP 32 mm Hg.)       Anesthesia Quick Evaluation

## 2018-08-08 ENCOUNTER — Other Ambulatory Visit (HOSPITAL_COMMUNITY)
Admission: RE | Admit: 2018-08-08 | Discharge: 2018-08-08 | Disposition: A | Discharge status: Home / Self Care | Payer: Medicare HMO | Source: Ambulatory Visit | Attending: Orthopaedic Surgery | Admitting: Orthopaedic Surgery

## 2018-08-08 DIAGNOSIS — Z01812 Encounter for preprocedural laboratory examination: Secondary | ICD-10-CM | POA: Diagnosis not present

## 2018-08-08 LAB — SARS CORONAVIRUS 2 (TAT 6-24 HRS): SARS Coronavirus 2: NEGATIVE

## 2018-08-12 ENCOUNTER — Ambulatory Visit (HOSPITAL_COMMUNITY): Payer: Medicare HMO | Admitting: Physician Assistant

## 2018-08-12 ENCOUNTER — Ambulatory Visit (HOSPITAL_COMMUNITY): Payer: Medicare HMO

## 2018-08-12 ENCOUNTER — Ambulatory Visit (HOSPITAL_COMMUNITY): Payer: Medicare HMO | Admitting: Anesthesiology

## 2018-08-12 ENCOUNTER — Encounter (HOSPITAL_COMMUNITY): Payer: Self-pay | Admitting: Certified Registered Nurse Anesthetist

## 2018-08-12 ENCOUNTER — Inpatient Hospital Stay (HOSPITAL_COMMUNITY): Payer: Medicare HMO

## 2018-08-12 ENCOUNTER — Inpatient Hospital Stay (HOSPITAL_COMMUNITY)
Admission: AD | Admit: 2018-08-12 | Discharge: 2018-08-15 | DRG: 470 | Disposition: A | Discharge status: Home / Self Care | Payer: Medicare HMO | Attending: Orthopaedic Surgery | Admitting: Orthopaedic Surgery

## 2018-08-12 ENCOUNTER — Encounter (HOSPITAL_COMMUNITY)
Admission: AD | Disposition: A | Discharge status: Home / Self Care | Payer: Self-pay | Source: Home / Self Care | Attending: Orthopaedic Surgery

## 2018-08-12 ENCOUNTER — Other Ambulatory Visit: Payer: Self-pay

## 2018-08-12 DIAGNOSIS — I69320 Aphasia following cerebral infarction: Secondary | ICD-10-CM | POA: Diagnosis not present

## 2018-08-12 DIAGNOSIS — Z800 Family history of malignant neoplasm of digestive organs: Secondary | ICD-10-CM

## 2018-08-12 DIAGNOSIS — Z1159 Encounter for screening for other viral diseases: Secondary | ICD-10-CM | POA: Diagnosis not present

## 2018-08-12 DIAGNOSIS — M1611 Unilateral primary osteoarthritis, right hip: Secondary | ICD-10-CM | POA: Diagnosis present

## 2018-08-12 DIAGNOSIS — Z9049 Acquired absence of other specified parts of digestive tract: Secondary | ICD-10-CM | POA: Diagnosis not present

## 2018-08-12 DIAGNOSIS — N183 Chronic kidney disease, stage 3 (moderate): Secondary | ICD-10-CM | POA: Diagnosis present

## 2018-08-12 DIAGNOSIS — Z87891 Personal history of nicotine dependence: Secondary | ICD-10-CM

## 2018-08-12 DIAGNOSIS — Z96641 Presence of right artificial hip joint: Secondary | ICD-10-CM

## 2018-08-12 DIAGNOSIS — Z8249 Family history of ischemic heart disease and other diseases of the circulatory system: Secondary | ICD-10-CM | POA: Diagnosis not present

## 2018-08-12 DIAGNOSIS — K219 Gastro-esophageal reflux disease without esophagitis: Secondary | ICD-10-CM | POA: Diagnosis present

## 2018-08-12 DIAGNOSIS — I129 Hypertensive chronic kidney disease with stage 1 through stage 4 chronic kidney disease, or unspecified chronic kidney disease: Secondary | ICD-10-CM | POA: Diagnosis present

## 2018-08-12 DIAGNOSIS — E785 Hyperlipidemia, unspecified: Secondary | ICD-10-CM | POA: Diagnosis present

## 2018-08-12 DIAGNOSIS — M169 Osteoarthritis of hip, unspecified: Secondary | ICD-10-CM | POA: Diagnosis present

## 2018-08-12 DIAGNOSIS — Z7901 Long term (current) use of anticoagulants: Secondary | ICD-10-CM | POA: Diagnosis not present

## 2018-08-12 DIAGNOSIS — I4891 Unspecified atrial fibrillation: Secondary | ICD-10-CM | POA: Diagnosis present

## 2018-08-12 DIAGNOSIS — Z79899 Other long term (current) drug therapy: Secondary | ICD-10-CM

## 2018-08-12 DIAGNOSIS — M1990 Unspecified osteoarthritis, unspecified site: Secondary | ICD-10-CM | POA: Diagnosis present

## 2018-08-12 DIAGNOSIS — Z419 Encounter for procedure for purposes other than remedying health state, unspecified: Secondary | ICD-10-CM

## 2018-08-12 HISTORY — PX: TOTAL HIP ARTHROPLASTY: SHX124

## 2018-08-12 HISTORY — DX: Presence of right artificial hip joint: Z96.641

## 2018-08-12 LAB — PROTIME-INR
INR: 1.2 (ref 0.8–1.2)
INR: 1.1 (ref 0.8–1.2)
Prothrombin Time: 14.7 seconds (ref 11.4–15.2)
Prothrombin Time: 14.4 seconds (ref 11.4–15.2)

## 2018-08-12 SURGERY — ARTHROPLASTY, HIP, TOTAL, ANTERIOR APPROACH
Anesthesia: Spinal | Site: Hip | Laterality: Right

## 2018-08-12 MED ORDER — ACETAMINOPHEN 325 MG PO TABS
325.0000 mg | ORAL_TABLET | Freq: Four times a day (QID) | ORAL | Status: DC | PRN
  Administered 2018-08-14: 14:00:00 650 mg via ORAL
  Filled 2018-08-12: qty 2

## 2018-08-12 MED ORDER — PROPOFOL 500 MG/50ML IV EMUL
INTRAVENOUS | Status: DC | PRN
  Administered 2018-08-12: 75 ug/kg/min via INTRAVENOUS

## 2018-08-12 MED ORDER — ACETAMINOPHEN 500 MG PO TABS: 1000.0000 mg | ORAL_TABLET | Freq: Once | ORAL | Status: DC

## 2018-08-12 MED ORDER — SODIUM CHLORIDE 0.9 % IV SOLN
INTRAVENOUS | Status: DC | PRN
  Administered 2018-08-12: 16:00:00 via INTRAVENOUS

## 2018-08-12 MED ORDER — HYDROCODONE-ACETAMINOPHEN 7.5-325 MG PO TABS
1.0000 | ORAL_TABLET | ORAL | Status: DC | PRN
  Administered 2018-08-12 – 2018-08-13 (×3): 1 via ORAL
  Filled 2018-08-12 (×3): qty 1

## 2018-08-12 MED ORDER — 0.9 % SODIUM CHLORIDE (POUR BTL) OPTIME
TOPICAL | Status: DC | PRN
  Administered 2018-08-12: 16:00:00 1000 mL

## 2018-08-12 MED ORDER — ENOXAPARIN SODIUM 60 MG/0.6ML ~~LOC~~ SOLN: 60.0000 mg | SUBCUTANEOUS | Status: DC

## 2018-08-12 MED ORDER — PHENYLEPHRINE HCL (PRESSORS) 10 MG/ML IV SOLN
INTRAVENOUS | Status: DC | PRN
  Administered 2018-08-12: 80 ug via INTRAVENOUS

## 2018-08-12 MED ORDER — CHLORHEXIDINE GLUCONATE 4 % EX LIQD: 60.0000 mL | Freq: Once | CUTANEOUS | Status: DC

## 2018-08-12 MED ORDER — HYDROCODONE-ACETAMINOPHEN 5-325 MG PO TABS: 1.0000 | ORAL_TABLET | ORAL | Status: DC | PRN

## 2018-08-12 MED ORDER — LACTATED RINGERS IV SOLN
INTRAVENOUS | Status: DC
  Administered 2018-08-12: 13:00:00 via INTRAVENOUS

## 2018-08-12 MED ORDER — ROSUVASTATIN CALCIUM 5 MG PO TABS
5.0000 mg | ORAL_TABLET | ORAL | Status: DC
  Administered 2018-08-15: 5 mg via ORAL
  Filled 2018-08-12 (×3): qty 1

## 2018-08-12 MED ORDER — METOCLOPRAMIDE HCL 5 MG PO TABS: 5.0000 mg | ORAL_TABLET | Freq: Three times a day (TID) | ORAL | Status: DC | PRN

## 2018-08-12 MED ORDER — PHENOL 1.4 % MT LIQD: 1.0000 | OROMUCOSAL | Status: DC | PRN

## 2018-08-12 MED ORDER — ACETAMINOPHEN ER 650 MG PO TBCR: 1300.0000 mg | EXTENDED_RELEASE_TABLET | Freq: Two times a day (BID) | ORAL | Status: DC

## 2018-08-12 MED ORDER — ONDANSETRON HCL 4 MG/2ML IJ SOLN: 4.0000 mg | Freq: Four times a day (QID) | INTRAMUSCULAR | Status: DC | PRN

## 2018-08-12 MED ORDER — ONDANSETRON HCL 4 MG PO TABS: 4.0000 mg | ORAL_TABLET | Freq: Four times a day (QID) | ORAL | Status: DC | PRN

## 2018-08-12 MED ORDER — TRANEXAMIC ACID-NACL 1000-0.7 MG/100ML-% IV SOLN: 1000.0000 mg | INTRAVENOUS | Status: DC

## 2018-08-12 MED ORDER — ADULT MULTIVITAMIN W/MINERALS CH
1.0000 | ORAL_TABLET | Freq: Every day | ORAL | Status: DC
  Administered 2018-08-12 – 2018-08-15 (×4): 1 via ORAL
  Filled 2018-08-12 (×4): qty 1

## 2018-08-12 MED ORDER — APIXABAN 5 MG PO TABS
5.0000 mg | ORAL_TABLET | Freq: Two times a day (BID) | ORAL | Status: DC
  Administered 2018-08-13 – 2018-08-15 (×5): 5 mg via ORAL
  Filled 2018-08-12 (×5): qty 1

## 2018-08-12 MED ORDER — FENTANYL CITRATE (PF) 250 MCG/5ML IJ SOLN
INTRAMUSCULAR | Status: AC
  Filled 2018-08-12: qty 5

## 2018-08-12 MED ORDER — PROPOFOL 10 MG/ML IV BOLUS
INTRAVENOUS | Status: DC | PRN
  Administered 2018-08-12: 20 mg via INTRAVENOUS

## 2018-08-12 MED ORDER — METOCLOPRAMIDE HCL 5 MG/ML IJ SOLN: 5.0000 mg | Freq: Three times a day (TID) | INTRAMUSCULAR | Status: DC | PRN

## 2018-08-12 MED ORDER — METOPROLOL TARTRATE 50 MG PO TABS
50.0000 mg | ORAL_TABLET | Freq: Two times a day (BID) | ORAL | Status: DC
  Administered 2018-08-12 – 2018-08-15 (×6): 50 mg via ORAL
  Filled 2018-08-12 (×6): qty 1

## 2018-08-12 MED ORDER — FENTANYL CITRATE (PF) 100 MCG/2ML IJ SOLN: 25.0000 ug | INTRAMUSCULAR | Status: DC | PRN

## 2018-08-12 MED ORDER — TRAMADOL HCL 50 MG PO TABS
100.0000 mg | ORAL_TABLET | Freq: Four times a day (QID) | ORAL | Status: DC | PRN
  Administered 2018-08-13 – 2018-08-15 (×4): 100 mg via ORAL
  Filled 2018-08-12 (×5): qty 2

## 2018-08-12 MED ORDER — DIPHENHYDRAMINE HCL 12.5 MG/5ML PO ELIX: 12.5000 mg | ORAL_SOLUTION | ORAL | Status: DC | PRN

## 2018-08-12 MED ORDER — CLINDAMYCIN PHOSPHATE 900 MG/50ML IV SOLN
900.0000 mg | INTRAVENOUS | Status: AC
  Administered 2018-08-12: 900 mg via INTRAVENOUS
  Filled 2018-08-12: qty 50

## 2018-08-12 MED ORDER — MORPHINE SULFATE (PF) 2 MG/ML IV SOLN: 0.5000 mg | INTRAVENOUS | Status: DC | PRN

## 2018-08-12 MED ORDER — ONDANSETRON HCL 4 MG/2ML IJ SOLN
INTRAMUSCULAR | Status: DC | PRN
  Administered 2018-08-12: 4 mg via INTRAVENOUS

## 2018-08-12 MED ORDER — LOSARTAN POTASSIUM 50 MG PO TABS
100.0000 mg | ORAL_TABLET | Freq: Every day | ORAL | Status: DC
  Administered 2018-08-12 – 2018-08-15 (×4): 100 mg via ORAL
  Filled 2018-08-12 (×4): qty 2

## 2018-08-12 MED ORDER — POVIDONE-IODINE 10 % EX SWAB: 2.0000 | Freq: Once | CUTANEOUS | Status: DC

## 2018-08-12 MED ORDER — METHOCARBAMOL 500 MG PO TABS: 500.0000 mg | ORAL_TABLET | Freq: Four times a day (QID) | ORAL | Status: DC | PRN

## 2018-08-12 MED ORDER — CLINDAMYCIN PHOSPHATE 600 MG/50ML IV SOLN
600.0000 mg | Freq: Four times a day (QID) | INTRAVENOUS | Status: AC
  Administered 2018-08-12: 22:00:00 600 mg via INTRAVENOUS
  Filled 2018-08-12: qty 50

## 2018-08-12 MED ORDER — METHOCARBAMOL 1000 MG/10ML IJ SOLN
500.0000 mg | Freq: Four times a day (QID) | INTRAVENOUS | Status: DC | PRN
  Filled 2018-08-12: qty 5

## 2018-08-12 MED ORDER — SODIUM CHLORIDE 0.9 % IV SOLN
INTRAVENOUS | Status: DC
  Administered 2018-08-12: 19:00:00 via INTRAVENOUS

## 2018-08-12 MED ORDER — BUPIVACAINE IN DEXTROSE 0.75-8.25 % IT SOLN
INTRATHECAL | Status: DC | PRN
  Administered 2018-08-12: 1.6 mL via INTRATHECAL

## 2018-08-12 MED ORDER — MENTHOL 3 MG MT LOZG: 1.0000 | LOZENGE | OROMUCOSAL | Status: DC | PRN

## 2018-08-12 MED ORDER — ALUM & MAG HYDROXIDE-SIMETH 200-200-20 MG/5ML PO SUSP: 30.0000 mL | ORAL | Status: DC | PRN

## 2018-08-12 MED ORDER — DEXAMETHASONE SODIUM PHOSPHATE 10 MG/ML IJ SOLN
INTRAMUSCULAR | Status: DC | PRN
  Administered 2018-08-12: 5 mg via INTRAVENOUS

## 2018-08-12 MED ORDER — SODIUM CHLORIDE 0.9 % IV SOLN
INTRAVENOUS | Status: DC | PRN
  Administered 2018-08-12: 40 ug/min via INTRAVENOUS

## 2018-08-12 MED ORDER — PROPOFOL 10 MG/ML IV BOLUS
INTRAVENOUS | Status: AC
  Filled 2018-08-12: qty 20

## 2018-08-12 MED ORDER — ESCITALOPRAM OXALATE 10 MG PO TABS
20.0000 mg | ORAL_TABLET | Freq: Every day | ORAL | Status: DC
  Administered 2018-08-12 – 2018-08-15 (×4): 20 mg via ORAL
  Filled 2018-08-12 (×5): qty 2

## 2018-08-12 MED ORDER — FAMOTIDINE 20 MG PO TABS
20.0000 mg | ORAL_TABLET | Freq: Every day | ORAL | Status: DC
  Administered 2018-08-12 – 2018-08-15 (×4): 20 mg via ORAL
  Filled 2018-08-12 (×4): qty 1

## 2018-08-12 MED ORDER — DOCUSATE SODIUM 100 MG PO CAPS
100.0000 mg | ORAL_CAPSULE | Freq: Two times a day (BID) | ORAL | Status: DC
  Administered 2018-08-12 – 2018-08-15 (×6): 100 mg via ORAL
  Filled 2018-08-12 (×6): qty 1

## 2018-08-12 MED ORDER — SODIUM CHLORIDE 0.9 % IR SOLN
Status: DC | PRN
  Administered 2018-08-12: 3000 mL

## 2018-08-12 SURGICAL SUPPLY — 54 items
BENZOIN TINCTURE PRP APPL 2/3 (GAUZE/BANDAGES/DRESSINGS) ×2 IMPLANT
BLADE CLIPPER SURG (BLADE) IMPLANT
BLADE SAW SGTL 18X1.27X75 (BLADE) ×2 IMPLANT
COVER SURGICAL LIGHT HANDLE (MISCELLANEOUS) ×2 IMPLANT
COVER WAND RF STERILE (DRAPES) ×2 IMPLANT
CUP SECTOR GRIPTON 50MM (Cup) ×1 IMPLANT
DRAPE C-ARM 42X72 X-RAY (DRAPES) ×2 IMPLANT
DRAPE STERI IOBAN 125X83 (DRAPES) ×2 IMPLANT
DRAPE U-SHAPE 47X51 STRL (DRAPES) ×6 IMPLANT
DRSG AQUACEL AG ADV 3.5X10 (GAUZE/BANDAGES/DRESSINGS) ×2 IMPLANT
DRSG XEROFORM 1X8 (GAUZE/BANDAGES/DRESSINGS) ×1 IMPLANT
DURAPREP 26ML APPLICATOR (WOUND CARE) ×2 IMPLANT
ELECT BLADE 4.0 EZ CLEAN MEGAD (MISCELLANEOUS) ×2
ELECT BLADE 6.5 EXT (BLADE) IMPLANT
ELECT REM PT RETURN 9FT ADLT (ELECTROSURGICAL) ×2
ELECTRODE BLDE 4.0 EZ CLN MEGD (MISCELLANEOUS) ×1 IMPLANT
ELECTRODE REM PT RTRN 9FT ADLT (ELECTROSURGICAL) ×1 IMPLANT
FACESHIELD WRAPAROUND (MASK) ×4 IMPLANT
FACESHIELD WRAPAROUND OR TEAM (MASK) ×2 IMPLANT
GLOVE BIOGEL PI IND STRL 8 (GLOVE) ×2 IMPLANT
GLOVE BIOGEL PI INDICATOR 8 (GLOVE) ×2
GLOVE ECLIPSE 8.0 STRL XLNG CF (GLOVE) ×3 IMPLANT
GLOVE ORTHO TXT STRL SZ7.5 (GLOVE) ×4 IMPLANT
GOWN STRL REUS W/ TWL LRG LVL3 (GOWN DISPOSABLE) ×2 IMPLANT
GOWN STRL REUS W/ TWL XL LVL3 (GOWN DISPOSABLE) ×2 IMPLANT
GOWN STRL REUS W/TWL LRG LVL3 (GOWN DISPOSABLE) ×2
GOWN STRL REUS W/TWL XL LVL3 (GOWN DISPOSABLE) ×2
HANDPIECE INTERPULSE COAX TIP (DISPOSABLE) ×1
HEAD FEM STD 32X+1 STRL (Hips) ×1 IMPLANT
KIT BASIN OR (CUSTOM PROCEDURE TRAY) ×2 IMPLANT
KIT TURNOVER KIT B (KITS) ×2 IMPLANT
LINER ACETABULAR 32X50 (Liner) ×1 IMPLANT
MANIFOLD NEPTUNE II (INSTRUMENTS) ×2 IMPLANT
NS IRRIG 1000ML POUR BTL (IV SOLUTION) ×2 IMPLANT
PACK TOTAL JOINT (CUSTOM PROCEDURE TRAY) ×2 IMPLANT
PAD ARMBOARD 7.5X6 YLW CONV (MISCELLANEOUS) ×2 IMPLANT
SET HNDPC FAN SPRY TIP SCT (DISPOSABLE) ×1 IMPLANT
STAPLER VISISTAT 35W (STAPLE) ×1 IMPLANT
STEM FEM SZ3 STD ACTIS (Stem) ×1 IMPLANT
STRIP CLOSURE SKIN 1/2X4 (GAUZE/BANDAGES/DRESSINGS) ×4 IMPLANT
SUT ETHIBOND NAB CT1 #1 30IN (SUTURE) ×2 IMPLANT
SUT MNCRL AB 4-0 PS2 18 (SUTURE) IMPLANT
SUT VIC AB 0 CT1 27 (SUTURE) ×1
SUT VIC AB 0 CT1 27XBRD ANBCTR (SUTURE) ×1 IMPLANT
SUT VIC AB 1 CT1 27 (SUTURE) ×1
SUT VIC AB 1 CT1 27XBRD ANBCTR (SUTURE) ×1 IMPLANT
SUT VIC AB 2-0 CT1 27 (SUTURE) ×1
SUT VIC AB 2-0 CT1 TAPERPNT 27 (SUTURE) ×1 IMPLANT
TOWEL GREEN STERILE (TOWEL DISPOSABLE) ×2 IMPLANT
TOWEL GREEN STERILE FF (TOWEL DISPOSABLE) ×2 IMPLANT
TRAY CATH 16FR W/PLASTIC CATH (SET/KITS/TRAYS/PACK) IMPLANT
TRAY FOLEY W/BAG SLVR 16FR (SET/KITS/TRAYS/PACK)
TRAY FOLEY W/BAG SLVR 16FR ST (SET/KITS/TRAYS/PACK) IMPLANT
WATER STERILE IRR 1000ML POUR (IV SOLUTION) ×4 IMPLANT

## 2018-08-12 NOTE — Anesthesia Procedure Notes (Signed)
Spinal  Patient location during procedure: OR Start time: 08/12/2018 2:59 PM End time: 08/12/2018 3:03 PM Staffing Anesthesiologist: Roderic Palau, MD Performed: anesthesiologist  Preanesthetic Checklist Completed: patient identified, surgical consent, pre-op evaluation, timeout performed, IV checked, risks and benefits discussed and monitors and equipment checked Spinal Block Patient position: sitting Prep: DuraPrep Patient monitoring: cardiac monitor, continuous pulse ox and blood pressure Approach: midline Location: L3-4 Injection technique: single-shot Needle Needle type: Pencan  Needle gauge: 24 G Needle length: 9 cm Assessment Sensory level: T8 Additional Notes Functioning IV was confirmed and monitors were applied. Sterile prep and drape, including hand hygiene and sterile gloves were used. The patient was positioned and the spine was prepped. The skin was anesthetized with lidocaine.  Free flow of clear CSF was obtained prior to injecting local anesthetic into the CSF.  The spinal needle aspirated freely following injection.  The needle was carefully withdrawn.  The patient tolerated the procedure well.

## 2018-08-12 NOTE — H&P (Signed)
TOTAL HIP ADMISSION H&P  Patient is admitted for right total hip arthroplasty.  Subjective:  Chief Complaint: right hip pain  HPI: Lorraine Page, 85 y.o. female, has a history of pain and functional disability in the right hip(s) due to arthritis and patient has failed non-surgical conservative treatments for greater than 12 weeks to include NSAID's and/or analgesics, corticosteriod injections, flexibility and strengthening excercises, use of assistive devices and activity modification.  Onset of symptoms was gradual starting 5 years ago with gradually worsening course since that time.The patient noted no past surgery on the right hip(s).  Patient currently rates pain in the right hip at 10 out of 10 with activity. Patient has night pain, worsening of pain with activity and weight bearing, trendelenberg gait, pain that interfers with activities of daily living, pain with passive range of motion and crepitus. Patient has evidence of subchondral cysts, subchondral sclerosis, periarticular osteophytes and joint space narrowing by imaging studies. This condition presents safety issues increasing the risk of falls.  There is no current active infection.  Patient Active Problem List   Diagnosis Date Noted  . Unilateral primary osteoarthritis, right hip 03/19/2018   Past Medical History:  Diagnosis Date  . Atrial fibrillation (HCC)   . Chronic kidney disease, stage 3 (HCC)   . CVA (cerebral vascular accident) (HCC)   . GERD (gastroesophageal reflux disease)   . Hyperlipidemia   . Hypertension     Past Surgical History:  Procedure Laterality Date  . carpel tunnel Right   . CHOLECYSTECTOMY      Current Facility-Administered Medications  Medication Dose Route Frequency Provider Last Rate Last Dose  . acetaminophen (TYLENOL) tablet 1,000 mg  1,000 mg Oral Once Gaynelle AduFitzgerald, William, MD      . chlorhexidine (HIBICLENS) 4 % liquid 4 application  60 mL Topical Once Kirtland Bouchardlark, Gilbert W, PA-C      .  [START ON 08/13/2018] clindamycin (CLEOCIN) IVPB 900 mg  900 mg Intravenous On Call to OR Kirtland Bouchardlark, Gilbert W, PA-C      . lactated ringers infusion   Intravenous Continuous Kathryne HitchBlackman, Reya Aurich Y, MD 50 mL/hr at 08/12/18 1259    . povidone-iodine 10 % swab 2 application  2 application Topical Once Kirtland Bouchardlark, Gilbert W, PA-C      . tranexamic acid (CYKLOKAPRON) IVPB 1,000 mg  1,000 mg Intravenous To OR Kirtland Bouchardlark, Gilbert W, PA-C       Allergies  Allergen Reactions  . Penicillins Anaphylaxis and Swelling    Local swelling, throat swelling   . Sulfa Antibiotics Anaphylaxis and Hives         Social History   Tobacco Use  . Smoking status: Former Games developermoker  . Smokeless tobacco: Never Used  Substance Use Topics  . Alcohol use: Not Currently    Family History  Problem Relation Age of Onset  . Stomach cancer Mother   . CAD Father      Review of Systems  Musculoskeletal: Positive for joint pain.  All other systems reviewed and are negative.   Objective:  Physical Exam  Constitutional: She is oriented to person, place, and time. She appears well-developed and well-nourished.  HENT:  Head: Normocephalic and atraumatic.  Eyes: Pupils are equal, round, and reactive to light. EOM are normal.  Neck: Normal range of motion. Neck supple.  Cardiovascular: Normal rate and regular rhythm.  Respiratory: Effort normal and breath sounds normal.  GI: Soft. Bowel sounds are normal.  Neurological: She is alert and oriented to person, place, and  time.  Skin: Skin is warm and dry.  Psychiatric: She has a normal mood and affect.    Vital signs in last 24 hours: Temp:  [98.3 F (36.8 C)] 98.3 F (36.8 C) (07/07 1227) Pulse Rate:  [77] 77 (07/07 1227) Resp:  [18] 18 (07/07 1227) BP: (198)/(86) 198/86 (07/07 1227) SpO2:  [98 %] 98 % (07/07 1227)  Labs:   Estimated body mass index is 29.35 kg/m as calculated from the following:   Height as of 08/05/18: 4\' 11"  (1.499 m).   Weight as of 08/05/18: 65.9  kg.   Imaging Review Plain radiographs demonstrate severe degenerative joint disease of the right hip(s). The bone quality appears to be good for age and reported activity level.      Assessment/Plan:  End stage arthritis, right hip(s)  The patient history, physical examination, clinical judgement of the provider and imaging studies are consistent with end stage degenerative joint disease of the right hip(s) and total hip arthroplasty is deemed medically necessary. The treatment options including medical management, injection therapy, arthroscopy and arthroplasty were discussed at length. The risks and benefits of total hip arthroplasty were presented and reviewed. The risks due to aseptic loosening, infection, stiffness, dislocation/subluxation,  thromboembolic complications and other imponderables were discussed.  The patient acknowledged the explanation, agreed to proceed with the plan and consent was signed. Patient is being admitted for inpatient treatment for surgery, pain control, PT, OT, prophylactic antibiotics, VTE prophylaxis, progressive ambulation and ADL's and discharge planning.The patient is planning to be discharged to be determined pending her post-op progress   Anticipated LOS equal to or greater than 2 midnights due to - Age 57 and older with one or more of the following:  - Obesity  - Expected need for hospital services (PT, OT, Nursing) required for safe  discharge  - Anticipated need for postoperative skilled nursing care or inpatient rehab  - Active co-morbidities: None OR   - Unanticipated findings during/Post Surgery: None  - Patient is a high risk of re-admission due to: None

## 2018-08-12 NOTE — Anesthesia Postprocedure Evaluation (Signed)
Anesthesia Post Note  Patient: Lorraine Page  Procedure(s) Performed: RIGHT TOTAL HIP ARTHROPLASTY ANTERIOR APPROACH (Right Hip)     Patient location during evaluation: PACU Anesthesia Type: Spinal Level of consciousness: awake and alert Pain management: pain level controlled Vital Signs Assessment: post-procedure vital signs reviewed and stable Respiratory status: spontaneous breathing and respiratory function stable Cardiovascular status: blood pressure returned to baseline and stable Postop Assessment: spinal receding Anesthetic complications: no    Last Vitals:  Vitals:   08/12/18 1730 08/12/18 1740  BP:  139/75  Pulse: 68 68  Resp: 14 15  Temp:    SpO2: 99% 97%    Last Pain:  Vitals:   08/12/18 1730  TempSrc:   PainSc: 0-No pain    LLE Motor Response: Purposeful movement (08/12/18 1730)   RLE Motor Response: Purposeful movement (08/12/18 1730)   L Sensory Level: L3-Anterior knee, lower leg (08/12/18 1730) R Sensory Level: L4-Anterior knee, lower leg (08/12/18 1730)  Jahleel Stroschein DANIEL

## 2018-08-12 NOTE — Brief Op Note (Signed)
08/12/2018  4:16 PM  PATIENT:  Lorraine Page  85 y.o. female  PRE-OPERATIVE DIAGNOSIS:  osteoarthritis right hip  POST-OPERATIVE DIAGNOSIS:  osteoarthritis right hip  PROCEDURE:  Procedure(s): RIGHT TOTAL HIP ARTHROPLASTY ANTERIOR APPROACH (Right)  SURGEON:  Surgeon(s) and Role:    Mcarthur Rossetti, MD - Primary  PHYSICIAN ASSISTANT: Benita Stabile, PA-C  ANESTHESIA:   spinal  EBL:  150 mL   COUNTS:  YES  DICTATION: .Other Dictation: Dictation Number [id number]  PLAN OF CARE: Admit to inpatient   PATIENT DISPOSITION:  PACU - hemodynamically stable.   Delay start of Pharmacological VTE agent (>24hrs) due to surgical blood loss or risk of bleeding: no

## 2018-08-12 NOTE — Progress Notes (Signed)
Orthopedic Tech Progress Note Patient Details:  Lorraine Page 1932/11/12 [id number]  Patient ID: [name], female   DOB: 1932/06/29, 85 y.o.   MRN: [id number] I received a call from the RN with a concern that it wouldn't be safe to apply the OHF to the pts bed due to the pt being confused. I told her if she felt it wasn't safe for it to be put on we wouldn't.  Karolee Stamps 08/12/2018, 9:17 PM

## 2018-08-12 NOTE — Transfer of Care (Signed)
Immediate Anesthesia Transfer of Care Note  Patient: Lorraine Page  Procedure(s) Performed: RIGHT TOTAL HIP ARTHROPLASTY ANTERIOR APPROACH (Right Hip)  Patient Location: PACU  Anesthesia Type:MAC and Spinal  Level of Consciousness: drowsy  Airway & Oxygen Therapy: Patient Spontanous Breathing and Patient connected to face mask oxygen  Post-op Assessment: Report given to RN and Post -op Vital signs reviewed and stable  Post vital signs: Reviewed and stable  Last Vitals:  Vitals Value Taken Time  BP 121/53 08/12/18 1638  Temp    Pulse 65 08/12/18 1645  Resp 16 08/12/18 1645  SpO2 100 % 08/12/18 1645  Vitals shown include unvalidated device data.  Last Pain:  Vitals:   08/12/18 1640  TempSrc:   PainSc: (P) Asleep         Complications: No apparent anesthesia complications

## 2018-08-12 NOTE — Op Note (Signed)
NAME: Lorraine Page, Lorraine B. MEDICAL RECORD [id number] ACCOUNT [id number] DATE OF BIRTH:[birth year] FACILITY: [facility] LOCATION: [facility] PHYSICIAN:Zoriyah Scheidegger Aretha ParrotY. Lorelai Huyser, MD  OPERATIVE REPORT  DATE OF PROCEDURE:  08/12/2018  PREOPERATIVE DIAGNOSIS:  Severe primary osteoarthritis and degenerative joint disease, right hip.  POSTOPERATIVE DIAGNOSIS:  Severe primary osteoarthritis and degenerative joint disease, right hip.  PROCEDURE:  Right total hip arthroplasty, direct anterior approach.  IMPLANTS:  DePuy Sector Gription acetabular component size 50, size 32+0 neutral polyethylene liner, size 3 ACTIS femoral component with standard offset, size 32+1 metal hip ball.  SURGEON:  Vanita PandaChristopher Y. Magnus IvanBlackman, MD  ASSISTANT:  Richardean CanalGilbert Clark, PA-C  ANESTHESIA:  Spinal.  ANTIBIOTICS:  900 mg IV clindamycin.  ESTIMATED BLOOD LOSS:  100-150 mL.  COMPLICATIONS:  None.  INDICATIONS:  The patient is a very pleasant 85 year old female well known to me.  She has debilitating end-stage arthritis of her right hip, and she is at the point where it has completely detrimentally affected her activities of daily living, quality  of life, and her mobility.  Her pain is daily, and it is 10/10.  She said her quality of life is miserable.  She has complex medical issues and a cardiac history.  She is on Eliquis as well.  We had a long and thorough discussion with her and her family.   Her son is a Advice workerphysician assistant with cardiology as well.  The family did get together as well as the patient and she is of sound mind and felt that a total hip arthroplasty is warranted given her hip pain and how miserable she is.  Her x-rays also  show severe end-stage arthritis of the right hip, and this has certainly been a problem for her mobility wise.  I do feel comfortable proceeding with this surgery.  We had a long and thorough discussion about the risks of acute blood loss anemia, nerve  or vessel injury, fracture,  infection, dislocation, DVT and implant failure.  We talked about the goals of being decreased pain, improved mobility and overall improved quality of life.  DESCRIPTION OF PROCEDURE:  After informed consent was obtained and appropriate right hip was marked, she was brought to the operating room and sat up on a stretcher where spinal anesthesia was then obtained.  She was then laid in supine position on the  stretcher.  A Foley catheter was placed, and I was really unable to get a good assessment of her leg length.  Due to severe hip disease, she is definitely shorter on her right operative side than the left.  We then put traction boots on both her feet and  placed her supine on the Hana fracture table, the perineal post in place, and both legs in in-line skeletal traction device and no traction applied.  Her right operative hip was prepped and draped with DuraPrep and sterile drapes.  A time-out was  called, and she was identified as correct patient, correct right hip.  I then made an incision just inferior and posterior to the anterior superior iliac spine and carried this obliquely down the leg.  We dissected down tensor fascia lata muscle.  The  tensor fascia was then divided longitudinally to proceed with a direct anterior approach to the hip.  We identified and cauterized the circumflex vessels.  I then identified the hip capsule.  I entered the hip capsule in an L-type format, finding a large  joint effusion and significant hip disease all around her femoral head and neck.  We placed curved  retractors around the medial and lateral femoral neck and then made our femoral neck cut with an oscillating saw and completed this with an osteotome.   This was proximal to the lesser trochanter.  We placed a corkscrew guide in the femoral head and removed the femoral head in its entirety and found it to be flattened and completely devoid of cartilage.  There were actually loose bodies in the acetabular  side as  well in the hip socket.  We then placed a bent Hohmann over the medial acetabular rim and removed remnants of the acetabular labrum and other debris.  We then began reaming under direct visualization from a size 44 reamer in stepwise increments  up to a size 49 with all reamers under direct visualization.  The last reamer was also placed under direct fluoroscopy so we could obtain our depth of reaming, our inclination and anteversion.  I then placed the real DePuy Sector Gription acetabular  component size 50 and a 32+0 neutral polyethylene liner for that size acetabular component.  Attention was then turned to the femur.  With the leg externally rotated to 120 degrees, extended and adducted, we were able to place a Mueller retractor  medially and Hohmann retractor behind the greater trochanter, released the lateral joint capsule and used a box-cutting osteotome to enter the femoral canal and a rongeur to lateralize.  We then used the broaching system from ACTIS with DePuy, broaching  from a size zero broach up to a size 3.  With the size 3 in place, we trialed a standard offset femoral neck and a 32+1 trial hip ball.  We brought the leg back over and up, and with traction and external rotation, reduced the pelvis.  We were pleased  with stability and range of motion as well as leg length and offset assessed clinically and radiographically.  We then dislocated the hip and removed the trial components.  We placed the real ACTIS femoral component with standard offset size 3 and the  real 32+1 metal hip ball and again reduced this in the acetabulum, and we were pleased with stability.  We then irrigated the soft tissue with normal saline solution using pulsatile lavage.  We were able to repair the joint capsule with interrupted #1  Ethibond suture, followed by running #1 Vicryl to close the tensor fascia, 0 Vicryl was used to close deep tissue, 2-0 Vicryl was used to close the subcutaneous tissue, and  interrupted staples were placed on the skin.  Xeroform and an Aquacel dressing  were applied.  She was taken off the Hana table and taken to recovery room in stable condition.  All final counts were correct.  There were no complications noted.  Note Benita Stabile, PA-C, assisted during the entire case.  His assistance was crucial for  facilitating all aspects of this case.  LN/NUANCE  D:08/12/2018 T:08/12/2018 JOB:[id number]

## 2018-08-13 ENCOUNTER — Encounter (HOSPITAL_COMMUNITY): Payer: Self-pay | Admitting: Orthopaedic Surgery

## 2018-08-13 LAB — CBC
HCT: 36.9 % (ref 36.0–46.0)
Hemoglobin: 12.1 g/dL (ref 12.0–15.0)
MCH: 30.5 pg (ref 26.0–34.0)
MCHC: 32.8 g/dL (ref 30.0–36.0)
MCV: 92.9 fL (ref 80.0–100.0)
Platelets: 177 10*3/uL (ref 150–400)
RBC: 3.97 MIL/uL (ref 3.87–5.11)
RDW: 13.1 % (ref 11.5–15.5)
WBC: 7.4 10*3/uL (ref 4.0–10.5)
nRBC: 0 % (ref 0.0–0.2)

## 2018-08-13 LAB — BASIC METABOLIC PANEL
Anion gap: 11 (ref 5–15)
BUN: 15 mg/dL (ref 8–23)
CO2: 21 mmol/L — ABNORMAL LOW (ref 22–32)
Calcium: 8.8 mg/dL — ABNORMAL LOW (ref 8.9–10.3)
Chloride: 106 mmol/L (ref 98–111)
Creatinine, Ser: 1 mg/dL (ref 0.44–1.00)
GFR calc Af Amer: >60 mL/min (ref >60)
GFR calc non Af Amer: 52 mL/min — ABNORMAL LOW (ref >60)
Glucose, Bld: 150 mg/dL — ABNORMAL HIGH (ref 70–99)
Potassium: 4.3 mmol/L (ref 3.5–5.1)
Sodium: 138 mmol/L (ref 135–145)

## 2018-08-13 MED ORDER — ENSURE ENLIVE PO LIQD
237.0000 mL | Freq: Two times a day (BID) | ORAL | Status: DC
  Administered 2018-08-13 – 2018-08-15 (×3): 237 mL via ORAL

## 2018-08-13 NOTE — Plan of Care (Signed)

## 2018-08-13 NOTE — Progress Notes (Signed)
Subjective: 1 Day Post-Op Procedure(s) (LRB): RIGHT TOTAL HIP ARTHROPLASTY ANTERIOR APPROACH (Right) Patient reports pain as moderate.   Objective: Vital signs in last 24 hours: Temp:  [97.9 F (36.6 C)-98.4 F (36.9 C)] 98.4 F (36.9 C) (07/08 0335) Pulse Rate:  [59-77] 75 (07/08 0335) Resp:  [12-18] 14 (07/08 0335) BP: (121-198)/(53-93) 155/62 (07/08 0500) SpO2:  [96 %-100 %] 100 % (07/08 0335)  Intake/Output from previous day: 07/07 0701 - 07/08 0700 In: 1000 [I.V.:1000] Out: 2400 [Urine:2250; Blood:150] Intake/Output this shift: No intake/output data recorded.  Recent Labs    08/13/18 0305  HGB 12.1   Recent Labs    08/13/18 0305  WBC 7.4  RBC 3.97  HCT 36.9  PLT 177   Recent Labs    08/13/18 0305  NA 138  K 4.3  CL 106  CO2 21*  BUN 15  CREATININE 1.00  GLUCOSE 150*  CALCIUM 8.8*   Recent Labs    08/12/18 1323 08/12/18 1813  INR 1.2 1.1    Sensation intact distally Intact pulses distally Dorsiflexion/Plantar flexion intact Incision: scant drainage   Assessment/Plan: 1 Day Post-Op Procedure(s) (LRB): RIGHT TOTAL HIP ARTHROPLASTY ANTERIOR APPROACH (Right) Up with therapy Is a fall risk having been significantly deconditioned over the last several months awaiting surgery.  She has significant heart issues in the past and is a fall risk.  Needs the full extent of therapy     Mcarthur Rossetti 08/13/2018, 7:17 AM

## 2018-08-13 NOTE — Evaluation (Signed)
Physical Therapy Evaluation Patient Details Name: Lorraine Page MRN: [id number] DOB: 1932/11/14 Today's Date: 08/13/2018   History of Present Illness  Pt is an 85 y.o. female s/p elective R THA (direct anterior approach) on 08/12/18. 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    Clinical Impression  Pt presents with an overall decrease in functional mobility secondary to above. PTA, pt lives alone with daily assist from daughter who lives nearby, ambulates with rollator. Educ on precautions and importance of mobility. Today, pt able to initiate transfer and gait training with RW and minA; despite pain, pt moving very well. Spoke with daughter [name](Catherine) on phone who verifies that she will provide initial 24/7 support. Pt would benefit from continued acute PT services to maximize functional mobility and independence prior to d/c with HHPT services.     Follow Up Recommendations Follow surgeon's recommendation for DC plan and follow-up therapies;Home health PT;Supervision/Assistance - 24 hour    Equipment Recommendations  None recommended by PT    Recommendations for Other Services       Precautions / Restrictions Precautions Precautions: Fall Restrictions Weight Bearing Restrictions: Yes RLE Weight Bearing: Weight bearing as tolerated      Mobility  Bed Mobility Overal bed mobility: Needs Assistance Bed Mobility: Supine to Sit     Supine to sit: Min assist     General bed mobility comments: MinA for RLE management and trunk elevation  Transfers Overall transfer level: Needs assistance Equipment used: Rolling walker (2 wheeled) Transfers: Sit to/from Stand Sit to Stand: Min guard;Min assist         General transfer comment: Initial minA to steady standing from EOB; [facility] with min guard, cues for hand placement  Ambulation/Gait Ambulation/Gait assistance: Min guard Gait Distance (Feet): 40 Feet Assistive device: Rolling walker (2  wheeled) Gait Pattern/deviations: Step-through pattern;Decreased stride length Gait velocity: Decreased Gait velocity interpretation: <1.31 ft/sec, indicative of household ambulator General Gait Details: Slow, antalgic gait with RW and close min guard for balance; pt with good RW navigation and WBAT through RLE. Continues to realize "this is hard because my leg hurts" but responds well to encouragement and reorientation to sx yesterday  Stairs            Wheelchair Mobility    Modified Rankin (Stroke Patients Only)       Balance Overall balance assessment: Needs assistance   Sitting balance-Leahy Scale: Fair Sitting balance - Comments: Required assist to don socks and adjust ted hose sitting EOB     Standing balance-Leahy Scale: Poor Standing balance comment: Reliant on UE support                             Pertinent Vitals/Pain Pain Assessment: Faces Faces Pain Scale: Hurts even more Pain Location: RLE Pain Descriptors / Indicators: Sore;Grimacing;Guarding Pain Intervention(s): Monitored during session;Repositioned    Home Living Family/patient expects to be discharged to:: Private residence Living Arrangements: Alone Available Help at Discharge: Family;Available 24 hours/day Type of Home: House Home Access: Stairs to enter Entrance Stairs-Rails: Right Entrance Stairs-Number of Steps: 4 Home Layout: One level Home Equipment: Walker - 2 wheels;Walker - 4 wheels;Grab bars - toilet;Bedside commode;Shower seat Additional Comments: Daughter lives 5-min away and visits patient daily    Prior Function Level of Independence: Needs assistance   Gait / Transfers Assistance Needed: Ambulatory with rollator. Does not drive. Reports she never does steps into house without  someone present. Daughter checks on patient daily  ADL's / Homemaking Assistance Needed: Daughter assists with IADLs, household tasks/cooking. Daughter present when pt bathes to supervise,  assists with dressing and ADL tasks as needed        Hand Dominance        Extremity/Trunk Assessment   Upper Extremity Assessment Upper Extremity Assessment: Generalized weakness    Lower Extremity Assessment Lower Extremity Assessment: Generalized weakness;RLE deficits/detail RLE Deficits / Details: S/p R THA; good activation of hip flexors, <3/5 limited by pain; knee flex/ext at least 3/5 RLE: Unable to fully assess due to pain       Communication   Communication: Expressive difficulties(Daughter reports residual aphasia since CVA in 02/2017)  Cognition Arousal/Alertness: Awake/alert Behavior During Therapy: WFL for tasks assessed/performed Overall Cognitive Status: History of cognitive impairments - at baseline Area of Impairment: Orientation;Attention;Memory;Following commands;Safety/judgement;Awareness;Problem solving                 Orientation Level: Disoriented to;Place;Time;Situation Current Attention Level: Selective Memory: Decreased short-term memory Following Commands: Follows multi-step commands inconsistently Safety/Judgement: Decreased awareness of deficits Awareness: Intellectual Problem Solving: Requires verbal cues General Comments: Daughter reports baseline disorientation, short-term memory deficits and residual aphasia from stroke last year. Pt stating November 1983, then answering "29" for a few more questions; responds well to reorientation. Some confusion when realizing hospital room wasn't room from home      General Comments      Exercises     Assessment/Plan    PT Assessment Patient needs continued PT services  PT Problem List Decreased strength;Decreased range of motion;Decreased activity tolerance;Decreased balance;Decreased mobility;Decreased cognition;Decreased knowledge of use of DME;Decreased knowledge of precautions;Pain       PT Treatment Interventions DME instruction;Gait training;Stair training;Functional mobility  training;Therapeutic activities;Therapeutic exercise;Balance training;Patient/family education    PT Goals (Current goals can be found in the Care Plan section)  Acute Rehab PT Goals Patient Stated Goal: Home with initial 24/7 assist from daughter PT Goal Formulation: With patient/family Time For Goal Achievement: 08/27/18 Potential to Achieve Goals: Good    Frequency 7X/week   Barriers to discharge        Co-evaluation               AM-PAC PT "6 Clicks" Mobility  Outcome Measure Help needed turning from your back to your side while in a flat bed without using bedrails?: A Little Help needed moving from lying on your back to sitting on the side of a flat bed without using bedrails?: A Little Help needed moving to and from a bed to a chair (including a wheelchair)?: A Little Help needed standing up from a chair using your arms (e.g., wheelchair or bedside chair)?: A Little Help needed to walk in hospital room?: A Little Help needed climbing 3-5 steps with a railing? : A Little 6 Click Score: 18    End of Session Equipment Utilized During Treatment: Gait belt Activity Tolerance: Patient tolerated treatment well Patient left: in chair;with call bell/phone within reach;with chair alarm set Nurse Communication: Mobility status PT Visit Diagnosis: Other abnormalities of gait and mobility (R26.89);Muscle weakness (generalized) (M62.81);Pain Pain - Right/Left: Right Pain - part of body: Leg;Hip    Time: 0823-0852 PT Time Calculation (min) (ACUTE ONLY): 29 min   Charges:   PT Evaluation $PT Eval Moderate Complexity: 1 Mod PT Treatments $Gait Training: 8-22 mins   Mabeline Caras, PT, DPT Acute Rehabilitation Services  Pager [phone] Office [place] 08/13/2018, 9:59 AM

## 2018-08-13 NOTE — Progress Notes (Signed)
Patient ID: Lorraine Page, female   DOB: 10-01-1932, 85 y.o.   MRN: [id number] We will continue her IV hydration today and recommend nutrition consult to assess her diet and oral intake in order to maximize her strength and endurance for recovering status post a hip replacement.

## 2018-08-13 NOTE — Progress Notes (Signed)
Physical Therapy Treatment Patient Details Name: Lorraine Page MRN: [id number] DOB: 03-14-1932 Today's Date: 08/13/2018    History of Present Illness Pt is an 85 y.o. female s/p elective R THA (direct anterior approach) on 08/12/18. 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   PT Comments    Pt progressing well with mobility, although remains limited by significant RLE pain limiting functional strength. Daughter present for caregiver training with session. Pt requiring up to minA for transfers and ambulation with RW. Will continue to follow acutely.   Follow Up Recommendations  Follow surgeon's recommendation for DC plan and follow-up therapies;Home health PT;Supervision/Assistance - 24 hour     Equipment Recommendations  None recommended by PT    Recommendations for Other Services       Precautions / Restrictions Precautions Precautions: Fall Restrictions Weight Bearing Restrictions: Yes RLE Weight Bearing: Weight bearing as tolerated    Mobility  Bed Mobility Overal bed mobility: Modified Independent Bed Mobility: Sit to Supine       Sit to supine: Modified independent (Device/Increase time)   General bed mobility comments: Increased time and effort, but no physical assist required  Transfers Overall transfer level: Needs assistance Equipment used: Rolling walker (2 wheeled) Transfers: Sit to/from Stand Sit to Stand: Min guard         General transfer comment: Able to stand from bed and BSC (over toilet) with RW and min guard; cues for hand placement, no physical assist required  Ambulation/Gait Ambulation/Gait assistance: Min guard Gait Distance (Feet): 60 Feet Assistive device: Rolling walker (2 wheeled) Gait Pattern/deviations: Step-through pattern;Decreased stride length Gait velocity: Decreased Gait velocity interpretation: <1.8 ft/sec, indicate of risk for recurrent falls General Gait Details: Slow, antalgic gait with RW and intermittetn min  guard for balance; pt with good RW navigation and WBAT through RLE.   Stairs             Wheelchair Mobility    Modified Rankin (Stroke Patients Only)       Balance Overall balance assessment: Needs assistance   Sitting balance-Leahy Scale: Fair       Standing balance-Leahy Scale: Poor Standing balance comment: Reliant on UE support                            Cognition Arousal/Alertness: Awake/alert Behavior During Therapy: WFL for tasks assessed/performed;Flat affect Overall Cognitive Status: History of cognitive impairments - at baseline Area of Impairment: Orientation;Attention;Memory;Following commands;Safety/judgement;Awareness;Problem solving                 Orientation Level: Disoriented to;Place;Time;Situation Current Attention Level: Selective Memory: Decreased short-term memory Following Commands: Follows multi-step commands inconsistently Safety/Judgement: Decreased awareness of deficits Awareness: Intellectual Problem Solving: Requires verbal cues General Comments: Daughter reports baseline disorientation, short-term memory deficits and residual aphasia from stroke last year.      Exercises      General Comments General comments (skin integrity, edema, etc.): Daughter present during session      Pertinent Vitals/Pain Pain Assessment: Faces Faces Pain Scale: Hurts whole lot Pain Location: RLE Pain Descriptors / Indicators: Sore;Grimacing;Guarding Pain Intervention(s): RN gave pain meds during session;Limited activity within patient's tolerance    Home Living                      Prior Function            PT Goals (current goals can now be found in the care plan section)  Acute Rehab PT Goals Patient Stated Goal: Home with initial 24/7 assist from daughter PT Goal Formulation: With patient/family Time For Goal Achievement: 08/27/18 Potential to Achieve Goals: Good Progress towards PT goals: Progressing toward  goals    Frequency    7X/week      PT Plan Current plan remains appropriate    Co-evaluation              AM-PAC PT "6 Clicks" Mobility   Outcome Measure  Help needed turning from your back to your side while in a flat bed without using bedrails?: None Help needed moving from lying on your back to sitting on the side of a flat bed without using bedrails?: None Help needed moving to and from a bed to a chair (including a wheelchair)?: A Little Help needed standing up from a chair using your arms (e.g., wheelchair or bedside chair)?: A Little Help needed to walk in hospital room?: A Little Help needed climbing 3-5 steps with a railing? : A Little 6 Click Score: 20    End of Session   Activity Tolerance: Patient tolerated treatment well Patient left: in bed;with call bell/phone within reach;with bed alarm set;with family/visitor present Nurse Communication: Mobility status PT Visit Diagnosis: Other abnormalities of gait and mobility (R26.89);Muscle weakness (generalized) (M62.81);Pain Pain - Right/Left: Right Pain - part of body: Leg;Hip     Time: 1217-1238 PT Time Calculation (min) (ACUTE ONLY): 21 min  Charges:  $Gait Training: 8-22 mins                    Lorraine Page, PT, DPT Acute Rehabilitation Services  Pager [phone] Office [phone]  Lorraine Page 08/13/2018, 5:15 PM

## 2018-08-13 NOTE — Progress Notes (Signed)
Initial Nutrition Assessment  DOCUMENTATION CODES:   Not applicable  INTERVENTION:    Ensure Enlive po BID, each supplement provides 350 kcal and 20 grams of protein  MVI daily  Obtain admission weight  NUTRITION DIAGNOSIS:   Increased nutrient needs related to post-op healing as evidenced by estimated needs.  GOAL:   Patient will meet greater than or equal to 90% of their needs  MONITOR:   PO intake, Supplement acceptance, Weight trends, Labs, Skin  REASON FOR ASSESSMENT:   Consult Diet education  ASSESSMENT:   Patient with PMH significant for CVA, GERD, HLD, and HTN. Presents this admission with end stage R hip arthritis.   7/7- R hip arthroplasty   Spoke with pt at bedside. Reports having a decreased appetite over the last two weeks due to unknown reasons. Reports during this time she consumed eggs and coffee for breakfast, skipped lunch, and had meat with vegetable for dinner. She did not use supplementation at home. Discussed the importance of protein intake to promote post-op healing and mobilization. Willing to try Ensure this stay.   Pt unsure of her UBW. Records indicate pt weighed 145 lb on 6/30. She does not have a recent admission weight. Would recommend obtaining new weight to assess for weight loss.   I/O: -1,400 ml since admit UOP: 2,250 ml x 24 hrs   Medications: colace, MVI with minerals Labs: CBG 90-150  NUTRITION - FOCUSED PHYSICAL EXAM:    Most Recent Value  Orbital Region  No depletion  Upper Arm Region  No depletion  Thoracic and Lumbar Region  Unable to assess  Buccal Region  No depletion  Temple Region  No depletion  Clavicle Bone Region  No depletion  Clavicle and Acromion Bone Region  No depletion  Scapular Bone Region  Unable to assess  Dorsal Hand  No depletion  Patellar Region  No depletion  Anterior Thigh Region  No depletion  Posterior Calf Region  No depletion  Edema (RD Assessment)  None  Hair  Reviewed  Eyes  Reviewed   Mouth  Reviewed  Skin  Reviewed  Nails  Reviewed     Diet Order:   Diet Order            Diet regular Room service appropriate? Yes; Fluid consistency: Thin  Diet effective now              EDUCATION NEEDS:   Education needs have been addressed  Skin:  Skin Assessment: Skin Integrity Issues: Skin Integrity Issues:: Incisions Incisions: R hip  Last BM:  7/7  Height:   Ht Readings from Last 1 Encounters:  08/05/18 4\' 11"  (1.499 m)    Weight:   Wt Readings from Last 1 Encounters:  08/05/18 65.9 kg    Ideal Body Weight:  44.1 kg  BMI:  There is no height or weight on file to calculate BMI.  Estimated Nutritional Needs:   Kcal:  1650-1850 kcal  Protein:  80-95 grams  Fluid:  >/= 1.6 L/day   Mariana Single RD, LDN Clinical Nutrition Pager # - [phone]

## 2018-08-13 NOTE — Evaluation (Addendum)
Occupational Therapy Evaluation Patient Details Name: Lorraine Page MRN: [id number] DOB: [birth year] Today's Date: 08/13/2018    History of Present Illness Pt is an 85 y.o. female s/p elective R THA (direct anterior approach) on 08/12/18. 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   Clinical Impression   Pt admitted with the above diagnoses and presents with below problem list. Pt will benefit from continued acute OT to address the below listed deficits and maximize independence with basic ADLs prior to d/c home with daughter providing initial 24/7 assist. PTA pt was mod I to supervision with basic ADLs, daughter visits pt daily to assist with IADLs (home management, meal prep, etc.) Pt limited by pain this session. Currently min A with LB ADLs and functional transfers. Daughter present during session and included in education. Daughter able to provide initial 24/7 assist at d/c.     Follow Up Recommendations  Home health OT;Supervision/Assistance - 24 hour    Equipment Recommendations  None recommended by OT    Recommendations for Other Services       Precautions / Restrictions Precautions Precautions: Fall Restrictions Weight Bearing Restrictions: Yes RLE Weight Bearing: Weight bearing as tolerated      Mobility Bed Mobility Overal bed mobility: Needs Assistance Bed Mobility: Supine to Sit     Supine to sit: Min assist     General bed mobility comments: up in recliner  Transfers Overall transfer level: Needs assistance Equipment used: Rolling walker (2 wheeled) Transfers: Sit to/from Stand Sit to Stand: Min guard;Min assist         General transfer comment: min steadying assist then min guard. cues for hand placement and technique.     Balance Overall balance assessment: Needs assistance   Sitting balance-Leahy Scale: Fair Sitting balance - Comments: Required assist to don socks and adjust ted hose sitting EOB     Standing balance-Leahy Scale:  Poor Standing balance comment: Reliant on UE support                           ADL either performed or assessed with clinical judgement   ADL Overall ADL's : Needs assistance/impaired Eating/Feeding: Set up;Sitting   Grooming: Set up;Sitting   Upper Body Bathing: Set up;Sitting   Lower Body Bathing: Minimal assistance;Sit to/from stand   Upper Body Dressing : Set up;Sitting   Lower Body Dressing: Minimal assistance;Sit to/from stand   Toilet Transfer: Minimal assistance;RW;BSC   Toileting- Clothing Manipulation and Hygiene: Minimal assistance;Sit to/from stand       Functional mobility during ADLs: Minimal assistance;Rolling walker(pivotal steps 2/2 pain) General ADL Comments: Pt completed pivotal steps to complete functional transfer. Daughter present and included in ADL education.     Vision         Perception     Praxis      Pertinent Vitals/Pain Pain Assessment: Faces Faces Pain Scale: Hurts whole lot Pain Location: RLE Pain Descriptors / Indicators: Sore;Grimacing;Guarding Pain Intervention(s): Limited activity within patient's tolerance;Monitored during session;Repositioned;Premedicated before session     Hand Dominance     Extremity/Trunk Assessment Upper Extremity Assessment Upper Extremity Assessment: Generalized weakness   Lower Extremity Assessment Lower Extremity Assessment: Defer to PT evaluation RLE Deficits / Details: S/p R THA; good activation of hip flexors, <3/5 limited by pain; knee flex/ext at least 3/5 RLE: Unable to fully assess due to pain       Communication Communication Communication: Expressive difficulties(residual aphashia since CVA 02/2017)   Cognition  Arousal/Alertness: Awake/alert Behavior During Therapy: WFL for tasks assessed/performed;Flat affect Overall Cognitive Status: History of cognitive impairments - at baseline Area of Impairment: Orientation;Attention;Memory;Following  commands;Safety/judgement;Awareness;Problem solving                  Daughter reports baseline disorientation, short-term memory deficits and residual aphasia from stroke last year.   General Comments       Exercises     Shoulder Instructions      Home Living Family/patient expects to be discharged to:: Private residence Living Arrangements: Alone Available Help at Discharge: Family;Available 24 hours/day Type of Home: House Home Access: Stairs to enter Entergy CorporationEntrance Stairs-Number of Steps: 4 Entrance Stairs-Rails: Right Home Layout: One level     Bathroom Shower/Tub: Chief Strategy OfficerTub/shower unit   Bathroom Toilet: Handicapped height(BSC over toilet)     Home Equipment: Environmental consultantWalker - 2 wheels;Walker - 4 wheels;Grab bars - toilet;Bedside commode;Shower seat   Additional Comments: Daughter lives 5-min away and visits patient daily      Prior Functioning/Environment Level of Independence: Needs assistance  Gait / Transfers Assistance Needed: Ambulatory with rollator. Does not drive. Reports she never does steps into house without someone present. Daughter checks on patient daily ADL's / Homemaking Assistance Needed: Daughter assists with IADLs, household tasks/cooking. Daughter present when pt bathes to supervise, assists with dressing and ADL tasks as needed            OT Problem List: Decreased strength;Decreased activity tolerance;Impaired balance (sitting and/or standing);Decreased knowledge of use of DME or AE;Decreased knowledge of precautions;Pain      OT Treatment/Interventions: Self-care/ADL training;DME and/or AE instruction;Therapeutic activities;Patient/family education;Balance training    OT Goals(Current goals can be found in the care plan section) Acute Rehab OT Goals Patient Stated Goal: Home with initial 24/7 assist from daughter OT Goal Formulation: With patient/family Time For Goal Achievement: 08/20/18 Potential to Achieve Goals: Good ADL Goals Pt Will Perform  Lower Body Bathing: sit to/from stand;with adaptive equipment;with min guard assist Pt Will Perform Lower Body Dressing: with min guard assist;with adaptive equipment;sit to/from stand Pt Will Transfer to Toilet: with min guard assist;ambulating Pt Will Perform Toileting - Clothing Manipulation and hygiene: with min guard assist;sit to/from stand Additional ADL Goal #1: Pt will complete bed mobility at supervision level to prepare for OOB ADLs.  OT Frequency: Min 2X/week   Barriers to D/C:            Co-evaluation              AM-PAC OT "6 Clicks" Daily Activity     Outcome Measure Help from another person eating meals?: None Help from another person taking care of personal grooming?: None Help from another person toileting, which includes using toliet, bedpan, or urinal?: A Little Help from another person bathing (including washing, rinsing, drying)?: A Little Help from another person to put on and taking off regular upper body clothing?: None Help from another person to put on and taking off regular lower body clothing?: A Little 6 Click Score: 21   End of Session Equipment Utilized During Treatment: Rolling walker Nurse Communication: Other (comment)(RN spoke with daughter re: pain meds)  Activity Tolerance: Patient limited by pain Patient left: with call bell/phone within reach;with chair alarm set;with family/visitor present;in chair  OT Visit Diagnosis: Unsteadiness on feet (R26.81);Pain                Time: [id number] OT Time Calculation (min): 22 min Charges:  OT General Charges $OT Visit: 1 Visit OT  Evaluation $OT Eval Low Complexity: 1 Low  Raynald KempKathryn Alexa Golebiewski, OT Acute Rehabilitation Services Pager: [phone] Office: [phone]   Pilar GrammesMathews, Ishanvi Mcquitty H 08/13/2018, 11:16 AM

## 2018-08-14 MED ORDER — TRAMADOL HCL 50 MG PO TABS: ORAL_TABLET | ORAL | 0 refills | Status: DC

## 2018-08-14 NOTE — Progress Notes (Signed)
Physical Therapy Treatment Patient Details Name: Lorraine Page MRN: [id number] DOB: [birth year] Today's Date: 08/14/2018    History of Present Illness Pt is an 85 y.o. female s/p elective R THA (direct anterior approach) on 08/12/18. 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   PT Comments    Pt progressing well with mobility. Able to ascend/descend steps with rail support and min guard for safety; daughter present for caregiver training. Pt continues to mobilize well; continues to c/o RLE pain. Will continue to follow acutely.   Follow Up Recommendations  Follow surgeon's recommendation for DC plan and follow-up therapies;Home health PT;Supervision/Assistance - 24 hour     Equipment Recommendations  (youth-sized rolling walker)    Recommendations for Other Services       Precautions / Restrictions Precautions Precautions: Fall Restrictions Weight Bearing Restrictions: Yes RLE Weight Bearing: Weight bearing as tolerated    Mobility  Bed Mobility Overal bed mobility: Needs Assistance Bed Mobility: Sit to Supine       Sit to supine: Min assist   General bed mobility comments: MinA to assist RLE with return to supine with bed flat; pt able to reposition in bed independently  Transfers Overall transfer level: Needs assistance Equipment used: Rolling walker (2 wheeled) Transfers: Sit to/from Stand Sit to Stand: Min guard;Supervision         General transfer comment: Pt inconsistent with correct hand placement, at times doing so without cues, other times requiring cues; no physical assist required  Ambulation/Gait Ambulation/Gait assistance: Supervision Gait Distance (Feet): 150 Feet Assistive device: Rolling walker (2 wheeled) Gait Pattern/deviations: Step-through pattern;Decreased stride length;Antalgic Gait velocity: Decreased   General Gait Details: Slow, antalgic gait with RW and supervision due to fall risk. Pt with good RW navigation and WBAT through  RLE. C/o R knee pain   Stairs Stairs: Yes Stairs assistance: Min guard Stair Management: One rail Right;Step to pattern;Sideways Number of Stairs: 4 General stair comments: Ascend/descended steps with BUE support on R-side rail; educ on technique. Daughter present to observe and educ on guarding for safety; daughter declined practicing guarding pt   Wheelchair Mobility    Modified Rankin (Stroke Patients Only)       Balance Overall balance assessment: Needs assistance   Sitting balance-Leahy Scale: Fair       Standing balance-Leahy Scale: Poor Standing balance comment: Able to static stand without UE support and min guard; stability improved with UE support                            Cognition Arousal/Alertness: Awake/alert Behavior During Therapy: WFL for tasks assessed/performed;Flat affect Overall Cognitive Status: History of cognitive impairments - at baseline Area of Impairment: Orientation;Attention;Memory;Following commands;Safety/judgement;Awareness;Problem solving                 Orientation Level: Disoriented to;Place;Time;Situation Current Attention Level: Selective Memory: Decreased short-term memory Following Commands: Follows multi-step commands inconsistently Safety/Judgement: Decreased awareness of deficits Awareness: Intellectual Problem Solving: Requires verbal cues General Comments: Daughter reports baseline disorientation, short-term memory deficits and residual aphasia from stroke last year.      Exercises      General Comments        Pertinent Vitals/Pain Pain Assessment: Faces Faces Pain Scale: Hurts a little bit Pain Location: R hip and knee Pain Descriptors / Indicators: Sore;Guarding Pain Intervention(s): Monitored during session    Home Living  Prior Function            PT Goals (current goals can now be found in the care plan section) Acute Rehab PT Goals Patient Stated Goal:  Home with initial 24/7 assist from daughter PT Goal Formulation: With patient/family Time For Goal Achievement: 08/27/18 Potential to Achieve Goals: Good Progress towards PT goals: Progressing toward goals    Frequency    7X/week      PT Plan Current plan remains appropriate    Co-evaluation              AM-PAC PT "6 Clicks" Mobility   Outcome Measure  Help needed turning from your back to your side while in a flat bed without using bedrails?: None Help needed moving from lying on your back to sitting on the side of a flat bed without using bedrails?: None Help needed moving to and from a bed to a chair (including a wheelchair)?: A Little Help needed standing up from a chair using your arms (e.g., wheelchair or bedside chair)?: A Little Help needed to walk in hospital room?: A Little Help needed climbing 3-5 steps with a railing? : A Little 6 Click Score: 20    End of Session Equipment Utilized During Treatment: Gait belt Activity Tolerance: Patient tolerated treatment well Patient left: in bed;with call bell/phone within reach;with bed alarm set;with family/visitor present Nurse Communication: Mobility status PT Visit Diagnosis: Other abnormalities of gait and mobility (R26.89);Muscle weakness (generalized) (M62.81);Pain Pain - Right/Left: Right Pain - part of body: Leg;Hip     Time: 5681-2751 PT Time Calculation (min) (ACUTE ONLY): 28 min  Charges:  $Gait Training: 23-37 mins                     Mabeline Caras, PT, DPT Acute Rehabilitation Services  Pager [phone] Office [place] 08/14/2018, 4:16 PM

## 2018-08-14 NOTE — Progress Notes (Signed)
1200 Pt's daughter is present in the room, aware of the plan of care.

## 2018-08-14 NOTE — Discharge Instructions (Signed)
Information on my medicine - ELIQUIS (apixaban)  Why was Eliquis prescribed for you? Eliquis was prescribed for you to reduce the risk of forming blood clots that can cause a stroke if you have a medical condition called atrial fibrillation (a type of irregular heartbeat) OR to reduce the risk of a blood clots forming after orthopedic surgery.  What do You need to know about Eliquis ? Take your Eliquis TWICE DAILY - one tablet in the morning and one tablet in the evening with or without food.  It would be best to take the doses about the same time each day.  If you have difficulty swallowing the tablet whole please discuss with your pharmacist how to take the medication safely.  Take Eliquis exactly as prescribed by your doctor and DO NOT stop taking Eliquis without talking to the doctor who prescribed the medication.  Stopping may increase your risk of developing a new clot or stroke.  Refill your prescription before you run out.  After discharge, you should have regular check-up appointments with your healthcare provider that is prescribing your Eliquis.  In the future your dose may need to be changed if your kidney function or weight changes by a significant amount or as you get older.  What do you do if you miss a dose? If you miss a dose, take it as soon as you remember on the same day and resume taking twice daily.  Do not take more than one dose of ELIQUIS at the same time.  Important Safety Information A possible side effect of Eliquis is bleeding. You should call your healthcare provider right away if you experience any of the following: ? Bleeding from an injury or your nose that does not stop. ? Unusual colored urine (red or dark brown) or unusual colored stools (red or black). ? Unusual bruising for unknown reasons. ? A serious fall or if you hit your head (even if there is no bleeding).  Some medicines may interact with Eliquis and might increase your risk of bleeding  or clotting while on Eliquis. To help avoid this, consult your healthcare provider or pharmacist prior to using any new prescription or non-prescription medications, including herbals, vitamins, non-steroidal anti-inflammatory drugs (NSAIDs) and supplements.  This website has more information on Eliquis (apixaban): [email or web address].   INSTRUCTIONS AFTER JOINT REPLACEMENT   o Remove items at home which could result in a fall. This includes throw rugs or furniture in walking pathways o ICE to the affected joint every three hours while awake for 30 minutes at a time, for at least the first 3-5 days, and then as needed for pain and swelling.  Continue to use ice for pain and swelling. You may notice swelling that will progress down to the foot and ankle.  This is normal after surgery.  Elevate your leg when you are not up walking on it.   o Continue to use the breathing machine you got in the hospital (incentive spirometer) which will help keep your temperature down.  It is common for your temperature to cycle up and down following surgery, especially at night when you are not up moving around and exerting yourself.  The breathing machine keeps your lungs expanded and your temperature down.   DIET:  As you were doing prior to hospitalization, we recommend a well-balanced diet.  DRESSING / WOUND CARE / SHOWERING  Keep the surgical dressing until follow up.  The dressing is water proof, so you can shower without any extra  covering.  IF THE DRESSING FALLS OFF or the wound gets wet inside, change the dressing with sterile gauze.  Please use good hand washing techniques before changing the dressing.  Do not use any lotions or creams on the incision until instructed by your surgeon.    ACTIVITY  o Increase activity slowly as tolerated, but follow the weight bearing instructions below.   o No driving for 6 weeks or until further direction given by your physician.  You cannot drive while taking narcotics.    o No lifting or carrying greater than 10 lbs. until further directed by your surgeon. o Avoid periods of inactivity such as sitting longer than an hour when not asleep. This helps prevent blood clots.  o You may return to work once you are authorized by your doctor.     WEIGHT BEARING   Weight bearing as tolerated with assist device (walker, cane, etc) as directed, use it as long as suggested by your surgeon or therapist, typically at least 4-6 weeks.   EXERCISES  Results after joint replacement surgery are often greatly improved when you follow the exercise, range of motion and muscle strengthening exercises prescribed by your doctor. Safety measures are also important to protect the joint from further injury. Any time any of these exercises cause you to have increased pain or swelling, decrease what you are doing until you are comfortable again and then slowly increase them. If you have problems or questions, call your caregiver or physical therapist for advice.   Rehabilitation is important following a joint replacement. After just a few days of immobilization, the muscles of the leg can become weakened and shrink (atrophy).  These exercises are designed to build up the tone and strength of the thigh and leg muscles and to improve motion. Often times heat used for twenty to thirty minutes before working out will loosen up your tissues and help with improving the range of motion but do not use heat for the first two weeks following surgery (sometimes heat can increase post-operative swelling).   These exercises can be done on a training (exercise) mat, on the floor, on a table or on a bed. Use whatever works the best and is most comfortable for you.    Use music or television while you are exercising so that the exercises are a pleasant break in your day. This will make your life better with the exercises acting as a break in your routine that you can look forward to.   Perform all exercises  about fifteen times, three times per day or as directed.  You should exercise both the operative leg and the other leg as well.  Exercises include:    Quad Sets - Tighten up the muscle on the front of the thigh (Quad) and hold for 5-10 seconds.    Straight Leg Raises - With your knee straight (if you were given a brace, keep it on), lift the leg to 60 degrees, hold for 3 seconds, and slowly lower the leg.  Perform this exercise against resistance later as your leg gets stronger.   Leg Slides: Lying on your back, slowly slide your foot toward your buttocks, bending your knee up off the floor (only go as far as is comfortable). Then slowly slide your foot back down until your leg is flat on the floor again.   Angel Wings: Lying on your back spread your legs to the side as far apart as you can without causing discomfort.  Hamstring Strength:  Lying on your back, push your heel against the floor with your leg straight by tightening up the muscles of your buttocks.  Repeat, but this time bend your knee to a comfortable angle, and push your heel against the floor.  You may put a pillow under the heel to make it more comfortable if necessary.   A rehabilitation program following joint replacement surgery can speed recovery and prevent re-injury in the future due to weakened muscles. Contact your doctor or a physical therapist for more information on knee rehabilitation.    CONSTIPATION  Constipation is defined medically as fewer than three stools per week and severe constipation as less than one stool per week.  Even if you have a regular bowel pattern at home, your normal regimen is likely to be disrupted due to multiple reasons following surgery.  Combination of anesthesia, postoperative narcotics, change in appetite and fluid intake all can affect your bowels.   YOU MUST use at least one of the following options; they are listed in order of increasing strength to get the job done.  They are all  available over the counter, and you may need to use some, POSSIBLY even all of these options:    Drink plenty of fluids (prune juice may be helpful) and high fiber foods Colace 100 mg by mouth twice a day  Senokot for constipation as directed and as needed Dulcolax (bisacodyl), take with full glass of water  Miralax (polyethylene glycol) once or twice a day as needed.  If you have tried all these things and are unable to have a bowel movement in the first 3-4 days after surgery call either your surgeon or your primary doctor.    If you experience loose stools or diarrhea, hold the medications until you stool forms back up.  If your symptoms do not get better within 1 week or if they get worse, check with your doctor.  If you experience "the worst abdominal pain ever" or develop nausea or vomiting, please contact the office immediately for further recommendations for treatment.   ITCHING:  If you experience itching with your medications, try taking only a single pain pill, or even half a pain pill at a time.  You can also use Benadryl over the counter for itching or also to help with sleep.   TED HOSE STOCKINGS:  Use stockings on both legs until for at least 2 weeks or as directed by physician office. They may be removed at night for sleeping.  MEDICATIONS:  See your medication summary on the After Visit Summary that nursing will review with you.  You may have some home medications which will be placed on hold until you complete the course of blood thinner medication.  It is important for you to complete the blood thinner medication as prescribed.  PRECAUTIONS:  If you experience chest pain or shortness of breath - call 911 immediately for transfer to the hospital emergency department.   If you develop a fever greater that 101 F, purulent drainage from wound, increased redness or drainage from wound, foul odor from the wound/dressing, or calf pain - CONTACT YOUR SURGEON.                                                    FOLLOW-UP APPOINTMENTS:  If you do not already  have a post-op appointment, please call the office for an appointment to be seen by your surgeon.  Guidelines for how soon to be seen are listed in your After Visit Summary, but are typically between 1-4 weeks after surgery.  OTHER INSTRUCTIONS:   Knee Replacement:  Do not place pillow under knee, focus on keeping the knee straight while resting. CPM instructions: 0-90 degrees, 2 hours in the morning, 2 hours in the afternoon, and 2 hours in the evening. Place foam block, curve side up under heel at all times except when in CPM or when walking.  DO NOT modify, tear, cut, or change the foam block in any way.  MAKE SURE YOU:   Understand these instructions.   Get help right away if you are not doing well or get worse.    Thank you for letting us be a part of your medical care team.  It is a privilege we respect greatly.  We hope these instructions will help you stay on track for a fast and full recovery!

## 2018-08-14 NOTE — Progress Notes (Signed)
Subjective: 2 Days Post-Op Procedure(s) (LRB): RIGHT TOTAL HIP ARTHROPLASTY ANTERIOR APPROACH (Right) Patient reports pain as moderate.  Made some progress with therapy yesterday.  Appreciate nutritionist consult.  Objective: Vital signs in last 24 hours: Temp:  [98.1 F (36.7 C)-99.6 F (37.6 C)] 98.1 F (36.7 C) (07/09 0412) Pulse Rate:  [71-84] 81 (07/09 0412) Resp:  [14-18] 14 (07/09 0412) BP: (138-169)/(69-92) 150/90 (07/09 0412) SpO2:  [95 %-100 %] 96 % (07/09 0412)  Intake/Output from previous day: 07/08 0701 - 07/09 0700 In: 1062.6 [I.V.:1062.6] Out: 200 [Urine:200] Intake/Output this shift: Total I/O In: 1062.6 [I.V.:1062.6] Out: 200 [Urine:200]  Recent Labs    08/13/18 0305  HGB 12.1   Recent Labs    08/13/18 0305  WBC 7.4  RBC 3.97  HCT 36.9  PLT 177   Recent Labs    08/13/18 0305  NA 138  K 4.3  CL 106  CO2 21*  BUN 15  CREATININE 1.00  GLUCOSE 150*  CALCIUM 8.8*   Recent Labs    08/12/18 1323 08/12/18 1813  INR 1.2 1.1    Sensation intact distally Intact pulses distally Dorsiflexion/Plantar flexion intact Incision: scant drainage   Assessment/Plan: 2 Days Post-Op Procedure(s) (LRB): RIGHT TOTAL HIP ARTHROPLASTY ANTERIOR APPROACH (Right) Up with therapy Plan for discharge tomorrow Discharge home with home health      Mcarthur Rossetti 08/14/2018, 7:00 AM

## 2018-08-14 NOTE — Plan of Care (Signed)
  Problem: Safety: Goal: Ability to remain free from injury will improve Outcome: Not Progressing Due to memory impairment. Has to have constant safety reminders.

## 2018-08-14 NOTE — Progress Notes (Signed)
Occupational Therapy Treatment Patient Details Name: Lorraine Page MRN: [id number] DOB: [birth year] Today's Date: 08/14/2018    History of present illness Pt is an 85 y.o. female s/p elective R THA (direct anterior approach) on 08/12/18. 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   OT comments  Pt progressing with acute OT goals. Pt tolerating functional mobility better this session compared to yesterday. Stood to complete grooming task with external support of sink. Daughter present. Reviewed ADL education. D/c plan remains appropriate.    Follow Up Recommendations  Home health OT;Supervision/Assistance - 24 hour    Equipment Recommendations  None recommended by OT    Recommendations for Other Services      Precautions / Restrictions Precautions Precautions: Fall Restrictions Weight Bearing Restrictions: Yes RLE Weight Bearing: Weight bearing as tolerated       Mobility Bed Mobility               General bed mobility comments: Received sitting in recliner  Transfers Overall transfer level: Needs assistance Equipment used: Rolling walker (2 wheeled) Transfers: Sit to/from Stand Sit to Stand: Min guard         General transfer comment: cues for hand placement. to/from recliner and 3n1 (over toilet)    Balance Overall balance assessment: Needs assistance   Sitting balance-Leahy Scale: Fair Sitting balance - Comments: Required assist to don socks and adjust ted hose sitting EOB     Standing balance-Leahy Scale: Poor Standing balance comment: Reliant on external support                           ADL either performed or assessed with clinical judgement   ADL Overall ADL's : Needs assistance/impaired     Grooming: Wash/dry hands;Min guard;Standing Grooming Details (indicate cue type and reason): stood at sink to wash hands. Cues for proximity to sink for external support                 Toilet Transfer: Min  guard;Ambulation;RW(3n1 over toilet) Toilet Transfer Details (indicate cue type and reason): cues for hand placement           General ADL Comments: Pt walked to/from bathroom, toilet transfer, stood at sink for grooming task. Daughter present, ADL education reviewed.     Vision       Perception     Praxis      Cognition Arousal/Alertness: Awake/alert Behavior During Therapy: WFL for tasks assessed/performed;Flat affect Overall Cognitive Status: History of cognitive impairments - at baseline Area of Impairment: Orientation;Attention;Memory;Following commands;Safety/judgement;Awareness;Problem solving                 Orientation Level: Disoriented to;Place;Time;Situation Current Attention Level: Selective Memory: Decreased short-term memory Following Commands: Follows multi-step commands inconsistently Safety/Judgement: Decreased awareness of deficits Awareness: Intellectual Problem Solving: Requires verbal cues General Comments: Daughter reports baseline disorientation, short-term memory deficits and residual aphasia from stroke last year.        Exercises Total Joint Exercises Long Arc Quad: AROM;Both;Seated Knee Flexion: AAROM;Right;Seated   Shoulder Instructions       General Comments Daughter present during session; gait belt provided    Pertinent Vitals/ Pain       Pain Assessment: Faces Faces Pain Scale: Hurts little more Pain Location: R hip Pain Descriptors / Indicators: Sore;Grimacing;Guarding Pain Intervention(s): Monitored during session;Repositioned;Limited activity within patient's tolerance  Home Living  Prior Functioning/Environment              Frequency  Min 2X/week        Progress Toward Goals  OT Goals(current goals can now be found in the care plan section)     Acute Rehab OT Goals Patient Stated Goal: Home with initial 24/7 assist from daughter OT Goal  Formulation: With patient/family Time For Goal Achievement: 08/20/18 Potential to Achieve Goals: Good ADL Goals Pt Will Perform Lower Body Bathing: sit to/from stand;with adaptive equipment;with min guard assist Pt Will Perform Lower Body Dressing: with min guard assist;with adaptive equipment;sit to/from stand Pt Will Transfer to Toilet: with min guard assist;ambulating Pt Will Perform Toileting - Clothing Manipulation and hygiene: with min guard assist;sit to/from stand Additional ADL Goal #1: Pt will complete bed mobility at supervision level to prepare for OOB ADLs.  Plan Discharge plan remains appropriate    Co-evaluation                 AM-PAC OT "6 Clicks" Daily Activity     Outcome Measure   Help from another person eating meals?: None Help from another person taking care of personal grooming?: None Help from another person toileting, which includes using toliet, bedpan, or urinal?: A Little Help from another person bathing (including washing, rinsing, drying)?: A Little Help from another person to put on and taking off regular upper body clothing?: None Help from another person to put on and taking off regular lower body clothing?: A Little 6 Click Score: 21    End of Session Equipment Utilized During Treatment: Rolling walker  OT Visit Diagnosis: Unsteadiness on feet (R26.81);Pain   Activity Tolerance Patient tolerated treatment well   Patient Left in chair;with call bell/phone within reach;with family/visitor present   Nurse Communication          Time: 7654-6503 OT Time Calculation (min): 19 min  Charges: OT General Charges $OT Visit: 1 Visit OT Treatments $Self Care/Home Management : 8-22 mins  Tyrone Schimke, OT Acute Rehabilitation Services Pager: [phone] Office: [phone]    Lorraine Page 08/14/2018, 12:08 PM

## 2018-08-14 NOTE — Progress Notes (Signed)
Physical Therapy Treatment Patient Details Name: Lorraine Page MRN: [id number] DOB: 08-Apr-1932 Today's Date: 08/14/2018    History of Present Illness Pt is an 85 y.o. female s/p elective R THA (direct anterior approach) on 08/12/18. 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    PT Comments    Pt progressing well with mobility. Today's session focused on continued transfer and gait training with RW; pt functioning with intermittent min guard for balance. Daughter present throughout session for education; gait belt provided for home use due to pt's risk for falls. Will plan for stair training next session.   Follow Up Recommendations  Follow surgeon's recommendation for DC plan and follow-up therapies;Home health PT;Supervision/Assistance - 24 hour     Equipment Recommendations  (youth-sized rolling walker)    Recommendations for Other Services       Precautions / Restrictions Precautions Precautions: Fall Restrictions Weight Bearing Restrictions: Yes RLE Weight Bearing: Weight bearing as tolerated    Mobility  Bed Mobility               General bed mobility comments: Received sitting in recliner  Transfers Overall transfer level: Needs assistance Equipment used: Rolling walker (2 wheeled) Transfers: Sit to/from Stand Sit to Stand: Min guard         General transfer comment: Able to stand from bed and BSC (over toilet) with RW and min guard; intermittent cues for hand placement  Ambulation/Gait Ambulation/Gait assistance: Min guard;Supervision Gait Distance (Feet): 160 Feet Assistive device: Rolling walker (2 wheeled)(youth-sized) Gait Pattern/deviations: Step-through pattern;Decreased stride length;Antalgic Gait velocity: Decreased Gait velocity interpretation: <1.31 ft/sec, indicative of household ambulator General Gait Details: Slow, antalgic gait with RW and intermittent min guard for balance; pt with good RW navigation and WBAT through RLE. C/o R  knee pain   Stairs             Wheelchair Mobility    Modified Rankin (Stroke Patients Only)       Balance Overall balance assessment: Needs assistance   Sitting balance-Leahy Scale: Fair       Standing balance-Leahy Scale: Poor Standing balance comment: Reliant on UE support                            Cognition Arousal/Alertness: Awake/alert Behavior During Therapy: WFL for tasks assessed/performed;Flat affect Overall Cognitive Status: History of cognitive impairments - at baseline Area of Impairment: Orientation;Attention;Memory;Following commands;Safety/judgement;Awareness;Problem solving                 Orientation Level: Disoriented to;Place;Time;Situation Current Attention Level: Selective Memory: Decreased short-term memory Following Commands: Follows multi-step commands inconsistently Safety/Judgement: Decreased awareness of deficits Awareness: Intellectual Problem Solving: Requires verbal cues General Comments: Daughter reports baseline disorientation, short-term memory deficits and residual aphasia from stroke last year.      Exercises Total Joint Exercises Long Arc Quad: AROM;Both;Seated Knee Flexion: AAROM;Right;Seated    General Comments General comments (skin integrity, edema, etc.): Daughter present during session; gait belt provided      Pertinent Vitals/Pain Pain Assessment: Faces Faces Pain Scale: Hurts little more Pain Location: R knee Pain Descriptors / Indicators: Sore;Grimacing;Guarding Pain Intervention(s): Monitored during session    Home Living                      Prior Function            PT Goals (current goals can now be found in the care plan section) Acute  Rehab PT Goals Patient Stated Goal: Home with initial 24/7 assist from daughter PT Goal Formulation: With patient/family Time For Goal Achievement: 08/27/18 Potential to Achieve Goals: Good Progress towards PT goals: Progressing toward  goals    Frequency    7X/week      PT Plan Current plan remains appropriate    Co-evaluation              AM-PAC PT "6 Clicks" Mobility   Outcome Measure  Help needed turning from your back to your side while in a flat bed without using bedrails?: None Help needed moving from lying on your back to sitting on the side of a flat bed without using bedrails?: None Help needed moving to and from a bed to a chair (including a wheelchair)?: A Little Help needed standing up from a chair using your arms (e.g., wheelchair or bedside chair)?: A Little Help needed to walk in hospital room?: A Little Help needed climbing 3-5 steps with a railing? : A Little 6 Click Score: 20    End of Session Equipment Utilized During Treatment: Gait belt Activity Tolerance: Patient tolerated treatment well Patient left: in chair;with call bell/phone within reach;with family/visitor present Nurse Communication: Mobility status PT Visit Diagnosis: Other abnormalities of gait and mobility (R26.89);Muscle weakness (generalized) (M62.81);Pain Pain - Right/Left: Right Pain - part of body: Leg;Hip     Time: [id number] PT Time Calculation (min) (ACUTE ONLY): 32 min  Charges:  $Gait Training: 8-22 mins $Therapeutic Exercise: 8-22 mins                    Ina HomesJaclyn Calvin Jablonowski, PT, DPT Acute Rehabilitation Services  Pager [phone] Office [phone]  Malachy ChamberJaclyn L Shah Insley 08/14/2018, 11:46 AM

## 2018-08-15 NOTE — Care Management Important Message (Signed)
Important Message  Patient Details  Name: Lorraine Page MRN: [id number] Date of Birth: 05-20-1932   Medicare Important Message Given:  Yes     Orbie Pyo 08/15/2018, 3:14 PM

## 2018-08-15 NOTE — Progress Notes (Signed)
Physical Therapy Treatment Patient Details Name: Lorraine Page MRN: [id number] DOB: 1932/12/20 Today's Date: 08/15/2018    History of Present Illness Pt is an 85 y.o. female s/p elective R THA (direct anterior approach) on 08/12/18. 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   PT Comments    Pt preparing for discharge home this afternoon. Daughter present for afternoon session and caregiver training. Reviewed education re: precautions, positioning, mobility, therex/ROM, incentive spirometer use, fall risk reduction. Pt moving well with RW requiring supervision to min guard for safety.    Follow Up Recommendations  Follow surgeon's recommendation for DC plan and follow-up therapies;Home health PT;Supervision/Assistance - 24 hour     Equipment Recommendations  (youth-sized RW)    Recommendations for Other Services       Precautions / Restrictions Precautions Precautions: Fall Restrictions Weight Bearing Restrictions: Yes RLE Weight Bearing: Weight bearing as tolerated    Mobility  Bed Mobility Overal bed mobility: Needs Assistance Bed Mobility: Supine to Sit     Supine to sit: Min assist     General bed mobility comments: Received sitting in recliner. Daughter reports pt's bed much taller; discussed recommendation for sturdy step stool, backing up to this with RW and daughter's assist for safety  Transfers Overall transfer level: Needs assistance Equipment used: Rolling walker (2 wheeled) Transfers: Sit to/from Stand Sit to Stand: Supervision;Min guard         General transfer comment: Pt consistently using correct hand placement this session standing from recliner and BSC (over toilet)  Ambulation/Gait Ambulation/Gait assistance: Supervision Gait Distance (Feet): 40 Feet Assistive device: Rolling walker (2 wheeled) Gait Pattern/deviations: Step-through pattern;Decreased stride length;Antalgic Gait velocity: Decreased Gait velocity interpretation: <1.8  ft/sec, indicate of risk for recurrent falls General Gait Details: Slow, antalgic gait with RW and supervision due to fall risk. Pt with good RW navigation and WBAT through RLE. Further distance limited by pain and IV site bleeding   Stairs             Wheelchair Mobility    Modified Rankin (Stroke Patients Only)       Balance Overall balance assessment: Needs assistance   Sitting balance-Leahy Scale: Fair       Standing balance-Leahy Scale: Fair Standing balance comment: Can static stand to raise pants standing at toilet with min guard for safety; stability improved with UE support                            Cognition Arousal/Alertness: Awake/alert Behavior During Therapy: WFL for tasks assessed/performed;Flat affect Overall Cognitive Status: History of cognitive impairments - at baseline                                 General Comments: Daughter reports baseline disorientation, short-term memory deficits and residual aphasia from stroke last year.      Exercises      General Comments General comments (skin integrity, edema, etc.): Daughter present during session; reviewed questions, concerns, precautions, recommendations. Also provided incentive spirometry and pt able to demonstrate correct technique      Pertinent Vitals/Pain Pain Assessment: Faces Faces Pain Scale: Hurts even more Pain Location: RLE Pain Descriptors / Indicators: Sore;Guarding;Moaning Pain Intervention(s): Monitored during session;Limited activity within patient's tolerance    Home Living  Prior Function            PT Goals (current goals can now be found in the care plan section) Acute Rehab PT Goals Patient Stated Goal: Home with initial 24/7 assist from daughter PT Goal Formulation: With patient/family Time For Goal Achievement: 08/27/18 Potential to Achieve Goals: Good Progress towards PT goals: Progressing toward goals     Frequency    7X/week      PT Plan Current plan remains appropriate    Co-evaluation              AM-PAC PT "6 Clicks" Mobility   Outcome Measure  Help needed turning from your back to your side while in a flat bed without using bedrails?: None Help needed moving from lying on your back to sitting on the side of a flat bed without using bedrails?: None Help needed moving to and from a bed to a chair (including a wheelchair)?: A Little Help needed standing up from a chair using your arms (e.g., wheelchair or bedside chair)?: A Little Help needed to walk in hospital room?: A Little Help needed climbing 3-5 steps with a railing? : A Little 6 Click Score: 20    End of Session Equipment Utilized During Treatment: Gait belt Activity Tolerance: Patient tolerated treatment well;Patient limited by pain Patient left: in chair;with call bell/phone within reach;with family/visitor present Nurse Communication: Mobility status PT Visit Diagnosis: Other abnormalities of gait and mobility (R26.89);Muscle weakness (generalized) (M62.81);Pain Pain - Right/Left: Right Pain - part of body: Leg;Hip     Time: 1346-1410 PT Time Calculation (min) (ACUTE ONLY): 24 min  Charges:  $Gait Training: 8-22 mins $Self Care/Home Management: 8-22                    Ina HomesJaclyn Kniyah Khun, PT, DPT Acute Rehabilitation Services  Pager [phone] Office [phone]  Malachy ChamberJaclyn L Tiauna Whisnant 08/15/2018, 3:02 PM

## 2018-08-15 NOTE — Discharge Summary (Signed)
Patient ID: Lorraine Page MRN: [id number] DOB/AGE: Mar 24, 1932 85 y.o.  Admit date: 08/12/2018 Discharge date: 08/15/2018  Admission Diagnoses:  Principal Problem:   Unilateral primary osteoarthritis, right hip Active Problems:   Status post total replacement of right hip   Discharge Diagnoses:  Same  Past Medical History:  Diagnosis Date  . Atrial fibrillation ([place])   . Chronic kidney disease, stage 3 ([place])   . CVA (cerebral vascular accident) ([place])   . GERD (gastroesophageal reflux disease)   . Hyperlipidemia   . Hypertension     Surgeries: Procedure(s): RIGHT TOTAL HIP ARTHROPLASTY ANTERIOR APPROACH on 08/12/2018   Consultants:   Discharged Condition: Improved  Hospital Course: Lorraine Page is an 85 y.o. female who was admitted 08/12/2018 for operative treatment ofUnilateral primary osteoarthritis, right hip. Patient has severe unremitting pain that affects sleep, daily activities, and work/hobbies. After pre-op clearance the patient was taken to the operating room on 08/12/2018 and underwent  Procedure(s): RIGHT TOTAL HIP ARTHROPLASTY ANTERIOR APPROACH.    Patient was given perioperative antibiotics:  Anti-infectives (From admission, onward)   Start     Dose/Rate Route Frequency Ordered Stop   08/13/18 0600  clindamycin (CLEOCIN) IVPB 900 mg     900 mg 100 mL/hr over 30 Minutes Intravenous On call to O.R. 08/12/18 1221 08/12/18 1504   08/12/18 1845  clindamycin (CLEOCIN) IVPB 600 mg     600 mg 100 mL/hr over 30 Minutes Intravenous Every 6 hours 08/12/18 1835 08/13/18 0644       Patient was given sequential compression devices, early ambulation, and chemoprophylaxis to prevent DVT.  Patient benefited maximally from hospital stay and there were no complications.    Recent vital signs:  Patient Vitals for the past 24 hrs:  BP Temp Temp src Pulse Resp SpO2  08/15/18 0326 115/73 98.8 F (37.1 C) Oral 63 18 98 %  08/14/18 2327 (!) 123/55 97.9 F (36.6 C) Oral  (!) 58 16 99 %  08/14/18 2128 (!) 132/52 98.3 F (36.8 C) Tympanic 64 16 98 %  08/14/18 1915 134/70 98.2 F (36.8 C) Oral 75 16 98 %  08/14/18 1407 (!) 143/63 100.3 F (37.9 C) Oral 64 16 98 %  08/14/18 0922 (!) 151/70 99.3 F (37.4 C) Oral 82 16 98 %     Recent laboratory studies:  Recent Labs    08/12/18 1323 08/12/18 1813 08/13/18 0305  WBC  --   --  7.4  HGB  --   --  12.1  HCT  --   --  36.9  PLT  --   --  177  NA  --   --  138  K  --   --  4.3  CL  --   --  106  CO2  --   --  21*  BUN  --   --  15  CREATININE  --   --  1.00  GLUCOSE  --   --  150*  INR 1.2 1.1  --   CALCIUM  --   --  8.8*     Discharge Medications:   Allergies as of 08/15/2018      Reactions   Penicillins Anaphylaxis, Swelling   Local swelling, throat swelling   Sulfa Antibiotics Anaphylaxis, Hives         Medication List    STOP taking these medications   enoxaparin 60 MG/0.6ML injection Commonly known as: Lovenox     TAKE these medications   Eliquis 5  MG Tabs tablet Generic drug: apixaban Take 5 mg by mouth 2 (two) times daily.   escitalopram 20 MG tablet Commonly known as: LEXAPRO Take 20 mg by mouth daily.   losartan 50 MG tablet Commonly known as: COZAAR Take 100 mg by mouth daily.   losartan 100 MG tablet Commonly known as: COZAAR Take 1 tablet (100 mg total) by mouth daily.   metoprolol tartrate 50 MG tablet Commonly known as: LOPRESSOR Take 50 mg by mouth 2 (two) times daily.   multivitamin tablet Take 1 tablet by mouth daily.   PROBIOTIC DAILY PO Take 1 capsule by mouth daily.   rosuvastatin 10 MG tablet Commonly known as: CRESTOR Take 5 mg by mouth 2 (two) times a week.   Tagamet HB 200 MG tablet Generic drug: cimetidine Take 200 mg by mouth daily.   traMADol 50 MG tablet Commonly known as: ULTRAM TAKE 1 OR 2 TABLETS BY MOUTH EVERY 6 HOURS AS NEEDED FOR PAIN What changed: additional instructions   trolamine salicylate 10 % cream Commonly known  as: ASPERCREME Apply 1 application topically as needed for muscle pain.   Tylenol 8 Hour 650 MG CR tablet Generic drug: acetaminophen Take 1,300 mg by mouth 2 (two) times daily.            Durable Medical Equipment  (From admission, onward)         Start     Ordered   08/12/18 1836  DME Walker rolling  Once    Question:  Patient needs a walker to treat with the following condition  Answer:  Status post total replacement of right hip   08/12/18 1835   08/12/18 1836  DME 3 n 1  Once     08/12/18 1835          Diagnostic Studies: Dg Pelvis Portable  Result Date: 08/12/2018 CLINICAL DATA:  Status post total hip replacement EXAM: PORTABLE PELVIS 1-2 VIEWS COMPARISON:  11/01/2016 FINDINGS: The patient has undergone total hip arthroplasty on the right. Alignment appears near anatomic. There are overlying skin staples and subcutaneous gas. There are degenerative changes of the left hip. There is no periprosthetic fracture. No dislocation. IMPRESSION: Expected postsurgical changes related to total hip arthroplasty on the right. Electronically Signed   By: Katherine Mantlehristopher  Green M.D.   On: 08/12/2018 18:48   Dg C-arm 1-60 Min  Result Date: 08/12/2018 CLINICAL DATA:  Right hip arthroplasty. EXAM: DG C-ARM 61-120 MIN; OPERATIVE RIGHT HIP WITH PELVIS COMPARISON:  None. FINDINGS: Intraoperative fluoroscopic images from anterior right hip arthroplasty demonstrate placement of 3 component hardware in expected alignment. No fractures seen. Fluoroscopy time is recorded as 18 seconds. IMPRESSION: Intraoperative fluoroscopic images from anterior right hip arthroplasty. Electronically Signed   By: Ted Mcalpineobrinka  Dimitrova M.D.   On: 08/12/2018 16:20   Dg Hip Operative Unilat W Or W/o Pelvis Right  Result Date: 08/12/2018 CLINICAL DATA:  Right hip arthroplasty. EXAM: DG C-ARM 61-120 MIN; OPERATIVE RIGHT HIP WITH PELVIS COMPARISON:  None. FINDINGS: Intraoperative fluoroscopic images from anterior right hip  arthroplasty demonstrate placement of 3 component hardware in expected alignment. No fractures seen. Fluoroscopy time is recorded as 18 seconds. IMPRESSION: Intraoperative fluoroscopic images from anterior right hip arthroplasty. Electronically Signed   By: Ted Mcalpineobrinka  Dimitrova M.D.   On: 08/12/2018 16:20    Disposition: Discharge disposition: 01-Home or Self Care         Follow-up Information    Kathryne HitchBlackman, Brevin Mcfadden Y, MD. Schedule an appointment as soon as possible for  a visit in 2 week(s).   Specialty: Orthopedic Surgery Contact information: [address] [place] [address] [phone]            Signed: Kathryne HitchChristopher Y Suvan Stcyr 08/15/2018, 6:36 AM

## 2018-08-15 NOTE — Progress Notes (Signed)
Pt is ambulating to the hall with walker with PT. Wants to go home. Pt's daughter is present in the room. Discharge instructions given to her daughter. Discharged to home.

## 2018-08-15 NOTE — TOC Transition Note (Signed)
Transition of Care [facility]) - CM/SW Discharge Note   Patient Details  Name: Lorraine Page MRN: [id number] Date of Birth: [birth year]  Transition of Care [facility]) CM/SW Contact:  Ninfa Meeker, RN Phone Number: [phone] (working remotely) 08/15/2018, 11:36 AM   Clinical Narrative:   85 yr old female s/p right total hip replacement. Case manager spoke with patient's daughter, via telephone, in patient's room. Tye [place] [phone], she will be staying with her mom for a few days at discharge. Patient was preoperatively setup with [facility], no changes. Tye Maryland said they have a rolling walker and 3in1 at home. No further needs identified.     Final next level of care: [place] Barriers to Discharge: No Barriers Identified   Patient Goals and CMS Choice Patient states their goals for this hospitalization and ongoing recovery are:: get better CMS Medicare.gov Compare Post Acute Care list provided to:: Other (Comment Required)(preoperatively setup with [facility]) Choice offered to / list presented to : Patient(at MD office)  Discharge Placement                       Discharge Plan and Services   Discharge Planning Services: CM Consult Post Acute Care Choice: Home Health          DME Arranged: N/A         HH Arranged: PT   Date [place]: 08/15/18(preoperatively arranged at MD office) Time [place]: 1134 Representative spoke with at [place]: confirmed with Tiffany Watson(confirmed with Joen Laura)  Social Determinants of Health (SDOH) Interventions     Readmission Risk Interventions No flowsheet data found.

## 2018-08-15 NOTE — Progress Notes (Signed)
Physical Therapy Treatment Patient Details Name: Lorraine Page MRN: [id number] DOB: 18-Dec-1932 Today's Date: 08/15/2018    History of Present Illness Pt is an 85 y.o. female s/p elective R THA (direct anterior approach) on 08/12/18. 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   PT Comments    Pt progressing well with mobility. Will plan for additional session this afternoon for caregiver training with daughter present.  Follow Up Recommendations  Follow surgeon's recommendation for DC plan and follow-up therapies;Home health PT;Supervision/Assistance - 24 hour     Equipment Recommendations  (youth-sized RW)    Recommendations for Other Services       Precautions / Restrictions Precautions Precautions: Fall Restrictions Weight Bearing Restrictions: Yes RLE Weight Bearing: Weight bearing as tolerated    Mobility  Bed Mobility Overal bed mobility: Needs Assistance Bed Mobility: Supine to Sit     Supine to sit: Min assist     General bed mobility comments: MinA to assist RLE  Transfers Overall transfer level: Needs assistance Equipment used: Rolling walker (2 wheeled)   Sit to Stand: Min guard;Supervision         General transfer comment: Pt inconsistent with correct hand placement, at times doing so without cues, other times requiring cues; no physical assist required  Ambulation/Gait Ambulation/Gait assistance: Supervision Gait Distance (Feet): 250 Feet Assistive device: Rolling walker (2 wheeled) Gait Pattern/deviations: Step-through pattern;Decreased stride length;Antalgic Gait velocity: Decreased Gait velocity interpretation: <1.8 ft/sec, indicate of risk for recurrent falls General Gait Details: Slow, antalgic gait with RW and supervision due to fall risk. Pt with good RW navigation and WBAT through RLE   Stairs             Wheelchair Mobility    Modified Rankin (Stroke Patients Only)       Balance Overall balance assessment: Needs  assistance   Sitting balance-Leahy Scale: Fair       Standing balance-Leahy Scale: Poor Standing balance comment: Able to static stand without UE support and min guard; stability improved with UE support                            Cognition Arousal/Alertness: Awake/alert Behavior During Therapy: WFL for tasks assessed/performed;Flat affect Overall Cognitive Status: History of cognitive impairments - at baseline                                 General Comments: Daughter reports baseline disorientation, short-term memory deficits and residual aphasia from stroke last year.      Exercises      General Comments        Pertinent Vitals/Pain Pain Assessment: Faces Faces Pain Scale: Hurts little more Pain Location: R hip and knee Pain Descriptors / Indicators: Sore;Guarding Pain Intervention(s): Monitored during session    Home Living                      Prior Function            PT Goals (current goals can now be found in the care plan section) Acute Rehab PT Goals Patient Stated Goal: Home with initial 24/7 assist from daughter PT Goal Formulation: With patient/family Time For Goal Achievement: 08/27/18 Potential to Achieve Goals: Good Progress towards PT goals: Progressing toward goals    Frequency    7X/week      PT Plan Current plan remains appropriate  Co-evaluation              AM-PAC PT "6 Clicks" Mobility   Outcome Measure  Help needed turning from your back to your side while in a flat bed without using bedrails?: None Help needed moving from lying on your back to sitting on the side of a flat bed without using bedrails?: None Help needed moving to and from a bed to a chair (including a wheelchair)?: A Little Help needed standing up from a chair using your arms (e.g., wheelchair or bedside chair)?: A Little Help needed to walk in hospital room?: A Little Help needed climbing 3-5 steps with a railing? : A  Little 6 Click Score: 20    End of Session Equipment Utilized During Treatment: Gait belt Activity Tolerance: Patient tolerated treatment well Patient left: in chair;with call bell/phone within reach;with chair alarm set Nurse Communication: Mobility status PT Visit Diagnosis: Other abnormalities of gait and mobility (R26.89);Muscle weakness (generalized) (M62.81);Pain Pain - Right/Left: Right Pain - part of body: Leg;Hip     Time: 9833-8250 PT Time Calculation (min) (ACUTE ONLY): 28 min  Charges:  $Gait Training: 23-37 mins                    Mabeline Caras, PT, DPT Acute Rehabilitation Services  Pager [phone] Office [place] 08/15/2018, 12:54 PM

## 2018-08-15 NOTE — Progress Notes (Signed)
Patient ID: Lorraine Page, female   DOB: Sep 14, 1932, 85 y.o.   MRN: [id number] Looks good overall.  Can be discharged to home this afternoon.

## 2018-08-18 ENCOUNTER — Telehealth: Payer: Self-pay | Admitting: Orthopaedic Surgery

## 2018-08-18 NOTE — Telephone Encounter (Signed)
Verbal order given to Lorraine Page  

## 2018-08-18 NOTE — Telephone Encounter (Signed)
Lorraine Page with [facility] called in requesting verbal orders for home health pt for 1 time a week for 1 week, and 3 times a week for 1 week and 2 times a week for 1 week.   [phone]

## 2018-08-25 ENCOUNTER — Encounter: Payer: Self-pay | Admitting: Orthopaedic Surgery

## 2018-08-25 ENCOUNTER — Inpatient Hospital Stay: Payer: Medicare HMO | Admitting: Orthopaedic Surgery

## 2018-08-25 ENCOUNTER — Ambulatory Visit (INDEPENDENT_AMBULATORY_CARE_PROVIDER_SITE_OTHER): Payer: Medicare HMO | Admitting: Orthopaedic Surgery

## 2018-08-25 VITALS — Ht 59.0 in | Wt 144.0 lb

## 2018-08-25 DIAGNOSIS — Z96641 Presence of right artificial hip joint: Secondary | ICD-10-CM

## 2018-08-25 NOTE — Progress Notes (Signed)
The patient is a very pleasant 85 year old female who is 2 weeks tomorrow status post a right total hip arthroplasty through a direct anterior edge.  She is working with home therapy and they are ready to release her after Wednesday.  She is ambulate with a cane.  She has been having some type of tooth, jaw or ear pain.  We checked all 3 areas out and did not see any fluid on ears or any issues with her teeth.  This was going on actually before her hip replacement surgery.  From a hip standpoint she seems to be doing well.  She is currently on Eliquis which she has been on before surgery.  Her daughter is with her.  There is no other issues that they are pointing out.  On examination her right hip incision looks good to remove the staples in place Steri-Strips.  There is significant bruising but no significant seroma.  Her leg lengths are near equal.  At this point she will continue increase her activities as comfort allows.  We will see her back in 4 weeks for repeat exam but no x-rays are needed.  All question concerns were answered and addressed.

## 2018-08-30 ENCOUNTER — Other Ambulatory Visit (INDEPENDENT_AMBULATORY_CARE_PROVIDER_SITE_OTHER): Payer: Self-pay | Admitting: Orthopaedic Surgery

## 2018-09-01 NOTE — Telephone Encounter (Signed)
Please advise 

## 2018-09-22 ENCOUNTER — Ambulatory Visit (INDEPENDENT_AMBULATORY_CARE_PROVIDER_SITE_OTHER): Payer: Medicare HMO

## 2018-09-22 ENCOUNTER — Ambulatory Visit (INDEPENDENT_AMBULATORY_CARE_PROVIDER_SITE_OTHER): Payer: Medicare HMO | Admitting: Orthopaedic Surgery

## 2018-09-22 ENCOUNTER — Encounter: Payer: Self-pay | Admitting: Orthopaedic Surgery

## 2018-09-22 DIAGNOSIS — M25511 Pain in right shoulder: Secondary | ICD-10-CM

## 2018-09-22 DIAGNOSIS — G8929 Other chronic pain: Secondary | ICD-10-CM | POA: Diagnosis not present

## 2018-09-22 DIAGNOSIS — Z96641 Presence of right artificial hip joint: Secondary | ICD-10-CM

## 2018-09-22 MED ORDER — METHYLPREDNISOLONE ACETATE 40 MG/ML IJ SUSP
40.0000 mg | INTRAMUSCULAR | Status: AC | PRN
  Administered 2018-09-22: 40 mg via INTRA_ARTICULAR

## 2018-09-22 MED ORDER — LIDOCAINE HCL 1 % IJ SOLN
3.0000 mL | INTRAMUSCULAR | Status: AC | PRN
  Administered 2018-09-22: 17:00:00 3 mL

## 2018-09-22 MED ORDER — TRAMADOL HCL 50 MG PO TABS: 50.0000 mg | ORAL_TABLET | Freq: Four times a day (QID) | ORAL | 0 refills | Status: DC | PRN

## 2018-09-22 NOTE — Progress Notes (Signed)
Office Visit Note   Patient: Lorraine Page           Date of Birth: [birth year]           MRN: [id number] Visit Date: 09/22/2018              Requested by: Reita Cliche, MD No address on file PCP: Reita Cliche, MD   Assessment & Plan: Visit Diagnoses:  1. Chronic right shoulder pain   2. Status post total replacement of right hip     Plan: I did provide a steroid injection in the right shoulder subacromial space and will see how this does for her from a pain standpoint.  We will send in some tramadol for pain as well since she cannot take anti-inflammatories with being on Eliquis.  We do not need to really see her back for 3 months.  At that visit I would like a standing AP pelvis and lateral of her right operative hip.  However if her shoulder symptoms persist I would have her family call the office because I do want her to bring her back and to Dr. Junius Roads for an ultrasound-guided enter articular injection into the right shoulder.  She would not need to be off all blood thinning medication for this.  All question concerns were otherwise answered and addressed.  Follow-Up Instructions: Return in about 3 months (around 12/23/2018).   Orders:  Orders Placed This Encounter  Procedures  . Large Joint Inj  . XR Shoulder Right   Meds ordered this encounter  Medications  . traMADol (ULTRAM) 50 MG tablet    Sig: Take 1-2 tablets (50-100 mg total) by mouth every 6 (six) hours as needed.    Dispense:  40 tablet    Refill:  0      Procedures: Large Joint Inj: R subacromial bursa on 09/22/2018 5:02 PM Indications: pain and diagnostic evaluation Details: 22 G 1.5 in needle  Arthrogram: No  Medications: 3 mL lidocaine 1 %; 40 mg methylPREDNISolone acetate 40 MG/ML Outcome: tolerated well, no immediate complications Procedure, treatment alternatives, risks and benefits explained, specific risks discussed. Consent was given by the patient. Immediately prior to procedure a  time out was called to verify the correct patient, procedure, equipment, support staff and site/side marked as required. Patient was prepped and draped in the usual sterile fashion.       Clinical Data: No additional findings.   Subjective: Chief Complaint  Patient presents with  . Right Hip - Follow-up  The patient is 41 days status post a right total hip arthroplasty.  She is been dealing with significant right shoulder pain and this is the side where she is most affected by stroke.  She has been hurting with overhead activities and it really flared up quite a bit recently.  She says her right hip is doing well.  She is on Eliquis as a blood thinner.  She reports feeling significantly better than she did prior to her hip replacement on the right side.  HPI  Review of Systems She currently denies any headache or chest pain.  She denies any fever, chills, nausea, vomiting Objective: Vital Signs: There were no vitals taken for this visit.  Physical Exam She is alert and orient x3 and in no acute distress Ortho Exam Examination of her right operative hip shows that it moves smoothly and fluidly.  She is very pleased with this hip.  Examination of her right shoulder does show significant pain with limitations  with abduction and external rotation.  Her internal rotation with adduction is also limited.  She has a steady amount of pain when I manipulate the shoulder but it is clinically well located. Specialty Comments:  No specialty comments available.  Imaging: Xr Shoulder Right  Result Date: 09/22/2018 3 views of the right shoulder obtained today.  There are no acute findings.  The shoulder is well located.  There is moderate AC joint arthritic changes.  There is only slight    PMFS History: Patient Active Problem List   Diagnosis Date Noted  . Status post total replacement of right hip 08/12/2018  . Unilateral primary osteoarthritis, right hip 03/19/2018   Past Medical  History:  Diagnosis Date  . Atrial fibrillation ([facility])   . Chronic kidney disease, stage 3 ([facility])   . CVA (cerebral vascular accident) ([facility])   . GERD (gastroesophageal reflux disease)   . Hyperlipidemia   . Hypertension     Family History  Problem Relation Age of Onset  . Stomach cancer Mother   . CAD Father     Past Surgical History:  Procedure Laterality Date  . carpel tunnel Right   . CHOLECYSTECTOMY    . TOTAL HIP ARTHROPLASTY Right 08/12/2018   Procedure: RIGHT TOTAL HIP ARTHROPLASTY ANTERIOR APPROACH;  Surgeon: Kathryne HitchBlackman, Chalice Philbert Y, MD;  Location: [facility] OR;  Service: Orthopedics;  Laterality: Right;   Social History   Occupational History  . Not on file  Tobacco Use  . Smoking status: Former Games developermoker  . Smokeless tobacco: Never Used  Substance and Sexual Activity  . Alcohol use: Not Currently  . Drug use: Never  . Sexual activity: Not on file

## 2018-09-25 ENCOUNTER — Other Ambulatory Visit: Payer: Self-pay | Admitting: *Deleted

## 2018-09-25 MED ORDER — APIXABAN 5 MG PO TABS: 5.0000 mg | ORAL_TABLET | Freq: Two times a day (BID) | ORAL | 12 refills | Status: DC

## 2018-12-10 ENCOUNTER — Telehealth: Payer: Self-pay | Admitting: *Deleted

## 2018-12-10 DIAGNOSIS — R0602 Shortness of breath: Secondary | ICD-10-CM

## 2018-12-10 MED ORDER — FUROSEMIDE 20 MG PO TABS: 20.0000 mg | ORAL_TABLET | Freq: Every day | ORAL | 3 refills | Status: DC

## 2018-12-10 NOTE — Telephone Encounter (Signed)
Spoke with pt, for the last 2 weeks she has noticed an increase in SOB when eating or walking long distance or to fast. She denies SOB with activity within the home. She has swelling in her feet by the end of the day and reports a weight gain of 15 lbs over the last 3 months. She has SOB when she first lays down but after 5 min will be fine. She denies chest pain or other symptoms. She was offered an appointment this afternoon but is unable to make it. She was scheduled to see the APP Tuesday next week. Discussed with dr Stanford Breed, will give patient furosemide 20 mg once daily and get bmp and bnp at follow up appointment. Patient made aware. Lab orders mailed to the pt

## 2018-12-15 NOTE — Progress Notes (Signed)
error 

## 2018-12-16 ENCOUNTER — Other Ambulatory Visit: Payer: Self-pay

## 2018-12-16 ENCOUNTER — Ambulatory Visit: Payer: Medicare HMO | Admitting: Cardiology

## 2018-12-16 ENCOUNTER — Encounter: Payer: Self-pay | Admitting: Cardiology

## 2018-12-16 VITALS — BP 118/82 | HR 42 | Temp 97.3°F | Ht 60.0 in | Wt 152.0 lb

## 2018-12-16 DIAGNOSIS — I4821 Permanent atrial fibrillation: Secondary | ICD-10-CM | POA: Insufficient documentation

## 2018-12-16 DIAGNOSIS — N1832 Chronic kidney disease, stage 3b: Secondary | ICD-10-CM | POA: Insufficient documentation

## 2018-12-16 DIAGNOSIS — M1611 Unilateral primary osteoarthritis, right hip: Secondary | ICD-10-CM

## 2018-12-16 DIAGNOSIS — R0602 Shortness of breath: Secondary | ICD-10-CM

## 2018-12-16 DIAGNOSIS — R001 Bradycardia, unspecified: Secondary | ICD-10-CM

## 2018-12-16 DIAGNOSIS — K219 Gastro-esophageal reflux disease without esophagitis: Secondary | ICD-10-CM | POA: Insufficient documentation

## 2018-12-16 DIAGNOSIS — Z7901 Long term (current) use of anticoagulants: Secondary | ICD-10-CM | POA: Diagnosis not present

## 2018-12-16 DIAGNOSIS — I480 Paroxysmal atrial fibrillation: Secondary | ICD-10-CM | POA: Insufficient documentation

## 2018-12-16 DIAGNOSIS — I10 Essential (primary) hypertension: Secondary | ICD-10-CM | POA: Insufficient documentation

## 2018-12-16 DIAGNOSIS — Z8673 Personal history of transient ischemic attack (TIA), and cerebral infarction without residual deficits: Secondary | ICD-10-CM | POA: Diagnosis not present

## 2018-12-16 DIAGNOSIS — I4891 Unspecified atrial fibrillation: Secondary | ICD-10-CM | POA: Insufficient documentation

## 2018-12-16 DIAGNOSIS — N183 Chronic kidney disease, stage 3 unspecified: Secondary | ICD-10-CM | POA: Insufficient documentation

## 2018-12-16 MED ORDER — METOPROLOL TARTRATE 25 MG PO TABS: 25.0000 mg | ORAL_TABLET | Freq: Two times a day (BID) | ORAL | 3 refills | Status: DC

## 2018-12-16 NOTE — Assessment & Plan Note (Signed)
Pt had an embolic CVA Jan 3491 when off Eliquis for hip injection. In the future she will need Lovenox crossover.

## 2018-12-16 NOTE — Assessment & Plan Note (Signed)
Jan 2019- residual expressive aphasia

## 2018-12-16 NOTE — Assessment & Plan Note (Signed)
Echo in Jan 2019 ( ? at [facility])  Showed preserved LVF mild MR, mod LAE

## 2018-12-16 NOTE — Progress Notes (Signed)
Cardiology Office Note:    Date:  12/16/2018   ID:  Lorraine Page, DOB 01-06-1933, MRN [id number]  PCP:  Brooke BonitoGallemore, Warren, MD  Cardiologist:  No primary care provider on file.  Electrophysiologist:  None   Referring MD: Brooke BonitoGallemore, Warren, MD   CC: SOB, weakness  History of Present Illness:    Lorraine JasmineDorothy B Page is a 85 y.o. 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  She has a history of "chronic atrial fibrillation".  [facility].  She saw Dr. Jens Somrenshaw in June 2020 for the first time.  The patient had been on Eliquis and in January 2019 she had an embolic stroke while she was off Eliquis for hip injection.  She has some residual expressive aphasia.  In the future Lovenox bridging is recommended.  Patient did ultimately have her hip replaced by Dr. Magnus IvanBlackman August 12, 2018.  Other medical issues include chronic renal insufficiency stage III with a GFR of 52, hypertension, her losartan was increased in June, dyslipidemia and GERD.  The patient recently had complained of dyspnea on exertion and some shortness of breath at night.  There was edema in her legs.  Dr. Jens Somrenshaw added furosemide over the phone.  She is seen today in follow-up.  The furosemide did seem to help, her edema is trace in the office.  The patient does say she is short of breath with exertion.  I do not get a clear history of orthopnea.  Interestingly her EKG today shows normal sinus rhythm with a heart rate of 42.  Past Medical History:  Diagnosis Date  . Atrial fibrillation (HCC)   . Chronic kidney disease, stage 3   . CVA (cerebral vascular accident) (HCC)   . GERD (gastroesophageal reflux disease)   . Hyperlipidemia   . Hypertension     Past Surgical History:  Procedure Laterality Date  . carpel tunnel Right   . CHOLECYSTECTOMY    . TOTAL HIP ARTHROPLASTY Right 08/12/2018   Procedure: RIGHT TOTAL HIP ARTHROPLASTY ANTERIOR APPROACH;  Surgeon: Kathryne HitchBlackman, Christopher Y, MD;  [facility];  Service: Orthopedics;  Laterality: Right;    Current Medications: Current Meds  Medication Sig  . acetaminophen (TYLENOL 8 HOUR) 650 MG CR tablet Take 1,300 mg by mouth 2 (two) times daily.   Marland Kitchen. apixaban (ELIQUIS) 5 MG TABS tablet Take 1 tablet (5 mg total) by mouth 2 (two) times daily.  . cimetidine (TAGAMET HB) 200 MG tablet Take 200 mg by mouth daily.   Marland Kitchen. escitalopram (LEXAPRO) 20 MG tablet Take 20 mg by mouth daily.  . furosemide (LASIX) 20 MG tablet Take 1 tablet (20 mg total) by mouth daily.  Marland Kitchen. losartan (COZAAR) 100 MG tablet Take 1 tablet (100 mg total) by mouth daily.  Marland Kitchen. losartan (COZAAR) 50 MG tablet Take 100 mg by mouth daily.  . metoprolol tartrate (LOPRESSOR) 25 MG tablet Take 1 tablet (25 mg total) by mouth 2 (two) times daily.  . Multiple Vitamin (MULTIVITAMIN) tablet Take 1 tablet by mouth daily.  . Probiotic Product (PROBIOTIC DAILY PO) Take 1 capsule by mouth daily.   . rosuvastatin (CRESTOR) 10 MG tablet Take 5 mg by mouth 2 (two) times a week.  . traMADol (ULTRAM) 50 MG tablet Take 1-2 tablets (50-100 mg total) by mouth every 6 (six) hours as needed.  . trolamine salicylate (ASPERCREME) 10 % cream Apply 1 application topically as needed for muscle pain.  . [DISCONTINUED] metoprolol tartrate (LOPRESSOR) 50  MG tablet Take 50 mg by mouth 2 (two) times daily.     Allergies:   Penicillins and Sulfa antibiotics   Social History   Socioeconomic History  . Marital status: Widowed    Spouse name: Not on file  . Number of children: 3  . Years of education: Not on file  . Highest education level: Not on file  Occupational History  . Not on file  Social Needs  . Financial resource strain: Not on file  . Food insecurity    Worry: Not on file    Inability: Not on file  . Transportation needs    Medical: Not on file    Non-medical: Not on file  Tobacco Use  . Smoking status: Former Games developer  . Smokeless tobacco: Never Used  Substance and Sexual Activity  .  Alcohol use: Not Currently  . Drug use: Never  . Sexual activity: Not on file  Lifestyle  . Physical activity    Days per week: Not on file    Minutes per session: Not on file  . Stress: Not on file  Relationships  . Social Musician on phone: Not on file    Gets together: Not on file    Attends religious service: Not on file    Active member of club or organization: Not on file    Attends meetings of clubs or organizations: Not on file    Relationship status: Not on file  Other Topics Concern  . Not on file  Social History Narrative  . Not on file     Family History: The patient's family history includes CAD in her father; Stomach cancer in her mother.  ROS:   Please see the history of present illness. The patient has noted some "hoarsness"- no trouble swallowing.  Overall she has a poor appetite.   She tells me she does not sleep well and her daughter asked about the possibility of sleep apnea.  There has been no history of syncope or near syncope.   All other systems reviewed and are negative.  EKGs/Labs/Other Studies Reviewed:    The following studies were reviewed today: There was no echo available in EPIC  EKG:  EKG is ordered today.  The ekg ordered today demonstrates NSR-HR 42  Recent Labs: 08/13/2018: BUN 15; Creatinine, Ser 1.00; Hemoglobin 12.1; Platelets 177; Potassium 4.3; Sodium 138  Recent Lipid Panel No results found for: CHOL, TRIG, HDL, CHOLHDL, VLDL, LDLCALC, LDLDIRECT  Physical Exam:    VS:  BP 118/82   Pulse (!) 42   Temp (!) 97.3 F (36.3 C)   Ht 5' (1.524 m)   Wt 152 lb (68.9 kg)   SpO2 100%   BMI 29.69 kg/m     Wt Readings from Last 3 Encounters:  12/16/18 152 lb (68.9 kg)  08/25/18 144 lb (65.3 kg)  08/05/18 145 lb 5 oz (65.9 kg)     GEN: Overweight elderly Caucasian female, in no acute distress HEENT: Normal NECK: No JVD; No carotid bruits LYMPHATICS: No lymphadenopathy CARDIAC: RRR, slow rate, no murmurs, rubs,  gallops RESPIRATORY:  Clear to auscultation without rales, wheezing or rhonchi  ABDOMEN: Obese, Soft, non-tender, non-distended, umbilical hernia noted.  MUSCULOSKELETAL:  Trace LE edema; No deformity  SKIN: Warm and dry NEUROLOGIC:  Alert and oriented x 3 PSYCHIATRIC:  Normal affect   ASSESSMENT:    Shortness of breath I suspect this may be from bradycardia, it's also possible she had a component of  CHF secondary to her slow HR.   Permanent atrial fibrillation (HCC) H/O CAF but she is in NSR today in the office- HR 42  H/O: CVA (cerebrovascular accident) Jan 2019- residual expressive aphasia  Chronic anticoagulation Pt had an embolic CVA Jan 2019 when off Eliquis for hip injection. In the future she will need Lovenox crossover.   Essential hypertension Echo in Jan 2019 ( ? at [facility])  Showed preserved LVF mild MR, mod LAE  CRI (chronic renal insufficiency), stage 3 (moderate) GFR 52 July 2020  Degenerative joint disease (DJD) of hip S/P RTH July 2020  PLAN:    Check 7 day ZIO, echo, and labs-CBC, BMP. BNP, TSH, Mg++.  Continue Lasix 20 mg and Losartan 100 mg.  Decrease Lopressor to 25 mg BID.  F/U 3-4 weeks.  Once stable consider sleep study.    Medication Adjustments/Labs and Tests Ordered: Current medicines are reviewed at length with the patient today.  Concerns regarding medicines are outlined above.  Orders Placed This Encounter  Procedures  . Basic metabolic panel  . CBC  . Magnesium  . TSH  . Brain natriuretic peptide  . LONG TERM MONITOR (3-14 DAYS)  . EKG 12-Lead  . ECHOCARDIOGRAM COMPLETE   Meds ordered this encounter  Medications  . metoprolol tartrate (LOPRESSOR) 25 MG tablet    Sig: Take 1 tablet (25 mg total) by mouth 2 (two) times daily.    Dispense:  60 tablet    Refill:  3    Patient Instructions  Medication Instructions:  DECREASE Metoprolol to 25 mg twice daily.  *If you need a refill on your cardiac medications before your next  appointment, please call your pharmacy*  Lab Work: Your physician recommends that you return for lab work today: BNP, BMET, CBC, TSH, Magnesium If you have labs (blood work) drawn today and your tests are completely normal, you will receive your results only by: Marland Kitchen MyChart Message (if you have MyChart) OR . A paper copy in the mail If you have any lab test that is abnormal or we need to change your treatment, we will call you to review the results.  Testing/Procedures: Your physician has requested that you have an echocardiogram. Echocardiography is a painless test that uses sound waves to create images of your heart. It provides your doctor with information about the size and shape of your heart and how well your heart's chambers and valves are working. This procedure takes approximately one hour. There are no restrictions for this procedure. SCHEDULE FOR LATER THIS WEEK OR NEXT WEEK  TEST WILL BE COMPLETED AT [address]  Your physician has recommended that you wear a 7 DAY ZIO-PATCH monitor. The Zio patch cardiac monitor continuously records heart rhythm data for up to 14 days, this is for patients being evaluated for multiple types heart rhythms. For the first 24 hours post application, please avoid getting the Zio monitor wet in the shower or by excessive sweating during exercise. After that, feel free to carry on with regular activities. Keep soaps and lotions away from the ZIO XT Patch.  This will be mailed to you, please expect 7-10 days to receive.        Follow-Up: At [facility], you and your health needs are our priority.  As part of our continuing mission to provide you with exceptional heart care, we have created designated Provider Care Teams.  These Care Teams include your primary Cardiologist (physician) and Advanced Practice Providers (  APPs -  Physician Assistants and Nurse Practitioners) who all work together to provide you with the care you need, when you  need it.  Your next appointment:   3-4 WEEKS  The format for your next appointment:   In Person  Provider:   Kerin Ransom, PA-C       Signed, Kerin Ransom, PA-C  12/16/2018 2:22 PM    [place]

## 2018-12-16 NOTE — Assessment & Plan Note (Signed)
H/O CAF but she is in NSR today in the office- HR 42

## 2018-12-16 NOTE — Assessment & Plan Note (Signed)
I suspect this may be from bradycardia, it's also possible she had a component of CHF secondary to her slow HR.

## 2018-12-16 NOTE — Assessment & Plan Note (Signed)
S/P RTH July 2020

## 2018-12-16 NOTE — Patient Instructions (Addendum)
Medication Instructions:  DECREASE Metoprolol to 25 mg twice daily.  *If you need a refill on your cardiac medications before your next appointment, please call your pharmacy*  Lab Work: Your physician recommends that you return for lab work today: BNP, BMET, CBC, TSH, Magnesium If you have labs (blood work) drawn today and your tests are completely normal, you will receive your results only by: Marland Kitchen MyChart Message (if you have MyChart) OR . A paper copy in the mail If you have any lab test that is abnormal or we need to change your treatment, we will call you to review the results.  Testing/Procedures: Your physician has requested that you have an echocardiogram. Echocardiography is a painless test that uses sound waves to create images of your heart. It provides your doctor with information about the size and shape of your heart and how well your heart's chambers and valves are working. This procedure takes approximately one hour. There are no restrictions for this procedure. SCHEDULE FOR LATER THIS WEEK OR NEXT WEEK  TEST WILL BE COMPLETED AT [place] [address]  Your physician has recommended that you wear a 7 DAY ZIO-PATCH monitor. The Zio patch cardiac monitor continuously records heart rhythm data for up to 14 days, this is for patients being evaluated for multiple types heart rhythms. For the first 24 hours post application, please avoid getting the Zio monitor wet in the shower or by excessive sweating during exercise. After that, feel free to carry on with regular activities. Keep soaps and lotions away from the ZIO XT Patch.  This will be mailed to you, please expect 7-10 days to receive.        Follow-Up: At [facility], you and your health needs are our priority.  As part of our continuing mission to provide you with exceptional heart care, we have created designated Provider Care Teams.  These Care Teams include your primary Cardiologist (physician) and Advanced  Practice Providers (APPs -  Physician Assistants and Nurse Practitioners) who all work together to provide you with the care you need, when you need it.  Your next appointment:   3-4 WEEKS  The format for your next appointment:   In Person  Provider:   Kerin Ransom, PA-C

## 2018-12-16 NOTE — Assessment & Plan Note (Signed)
GFR 52 July 2020

## 2018-12-17 LAB — BASIC METABOLIC PANEL
BUN/Creatinine Ratio: 12 (ref 12–28)
BUN: 14 mg/dL (ref 8–27)
CO2: 23 mmol/L (ref 20–29)
Calcium: 9.9 mg/dL (ref 8.7–10.3)
Chloride: 104 mmol/L (ref 96–106)
Creatinine, Ser: 1.16 mg/dL — ABNORMAL HIGH (ref 0.57–1.00)
GFR calc Af Amer: 50 mL/min/{1.73_m2} — ABNORMAL LOW (ref >59)
GFR calc non Af Amer: 44 mL/min/{1.73_m2} — ABNORMAL LOW (ref >59)
Glucose: 92 mg/dL (ref 65–99)
Potassium: 4.9 mmol/L (ref 3.5–5.2)
Sodium: 144 mmol/L (ref 134–144)

## 2018-12-17 LAB — CBC
Hematocrit: 46.9 % — ABNORMAL HIGH (ref 34.0–46.6)
Hemoglobin: 15.1 g/dL (ref 11.1–15.9)
MCH: 29.8 pg (ref 26.6–33.0)
MCHC: 32.2 g/dL (ref 31.5–35.7)
MCV: 93 fL (ref 79–97)
Platelets: 220 10*3/uL (ref 150–450)
RBC: 5.07 x10E6/uL (ref 3.77–5.28)
RDW: 14.4 % (ref 11.7–15.4)
WBC: 7.4 10*3/uL (ref 3.4–10.8)

## 2018-12-17 LAB — TSH: TSH: 5.9 u[IU]/mL — ABNORMAL HIGH (ref 0.450–4.500)

## 2018-12-17 LAB — BRAIN NATRIURETIC PEPTIDE: BNP: 2794.4 pg/mL — ABNORMAL HIGH (ref 0.0–100.0)

## 2018-12-17 LAB — MAGNESIUM: Magnesium: 2.2 mg/dL (ref 1.6–2.3)

## 2018-12-19 ENCOUNTER — Other Ambulatory Visit (INDEPENDENT_AMBULATORY_CARE_PROVIDER_SITE_OTHER): Payer: Medicare HMO

## 2018-12-19 DIAGNOSIS — I10 Essential (primary) hypertension: Secondary | ICD-10-CM | POA: Diagnosis not present

## 2018-12-19 DIAGNOSIS — Z8673 Personal history of transient ischemic attack (TIA), and cerebral infarction without residual deficits: Secondary | ICD-10-CM | POA: Diagnosis not present

## 2018-12-19 DIAGNOSIS — I4821 Permanent atrial fibrillation: Secondary | ICD-10-CM

## 2018-12-19 DIAGNOSIS — Z7901 Long term (current) use of anticoagulants: Secondary | ICD-10-CM

## 2018-12-22 ENCOUNTER — Ambulatory Visit: Payer: Medicare HMO | Admitting: Orthopaedic Surgery

## 2018-12-23 ENCOUNTER — Other Ambulatory Visit: Payer: Self-pay

## 2018-12-23 ENCOUNTER — Encounter (HOSPITAL_COMMUNITY): Payer: Self-pay

## 2018-12-23 ENCOUNTER — Ambulatory Visit (HOSPITAL_COMMUNITY): Payer: Medicare HMO | Attending: Cardiology

## 2018-12-23 DIAGNOSIS — I10 Essential (primary) hypertension: Secondary | ICD-10-CM

## 2018-12-23 DIAGNOSIS — R0602 Shortness of breath: Secondary | ICD-10-CM | POA: Diagnosis not present

## 2018-12-23 DIAGNOSIS — I4821 Permanent atrial fibrillation: Secondary | ICD-10-CM

## 2018-12-23 DIAGNOSIS — Z8673 Personal history of transient ischemic attack (TIA), and cerebral infarction without residual deficits: Secondary | ICD-10-CM | POA: Insufficient documentation

## 2018-12-23 DIAGNOSIS — R001 Bradycardia, unspecified: Secondary | ICD-10-CM | POA: Insufficient documentation

## 2018-12-23 DIAGNOSIS — Z7901 Long term (current) use of anticoagulants: Secondary | ICD-10-CM | POA: Diagnosis present

## 2018-12-23 DIAGNOSIS — N183 Chronic kidney disease, stage 3 unspecified: Secondary | ICD-10-CM | POA: Diagnosis present

## 2018-12-23 DIAGNOSIS — M1611 Unilateral primary osteoarthritis, right hip: Secondary | ICD-10-CM | POA: Diagnosis present

## 2018-12-23 NOTE — Progress Notes (Signed)
Lorraine Page presented for an echocardiogram this afternoon referred by Kerin Ransom, PA. Echo showed a severely elevated PA pressure. DOD (Dr. Johnsie Cancel) was notified and he advised to have Kerin Ransom to follow up with additional exams. Patient is stable to be discharge.  Wyatt Mage, RDCS

## 2018-12-24 ENCOUNTER — Other Ambulatory Visit: Payer: Self-pay | Admitting: *Deleted

## 2018-12-24 ENCOUNTER — Ambulatory Visit (INDEPENDENT_AMBULATORY_CARE_PROVIDER_SITE_OTHER): Payer: Medicare HMO

## 2018-12-24 ENCOUNTER — Ambulatory Visit (INDEPENDENT_AMBULATORY_CARE_PROVIDER_SITE_OTHER): Payer: Medicare HMO | Admitting: Orthopaedic Surgery

## 2018-12-24 ENCOUNTER — Telehealth: Payer: Self-pay | Admitting: *Deleted

## 2018-12-24 ENCOUNTER — Encounter: Payer: Self-pay | Admitting: Orthopaedic Surgery

## 2018-12-24 ENCOUNTER — Other Ambulatory Visit: Payer: Self-pay

## 2018-12-24 VITALS — Ht 59.0 in | Wt 145.0 lb

## 2018-12-24 DIAGNOSIS — Z96641 Presence of right artificial hip joint: Secondary | ICD-10-CM | POA: Diagnosis not present

## 2018-12-24 DIAGNOSIS — R0602 Shortness of breath: Secondary | ICD-10-CM

## 2018-12-24 DIAGNOSIS — I272 Pulmonary hypertension, unspecified: Secondary | ICD-10-CM

## 2018-12-24 MED ORDER — FUROSEMIDE 20 MG PO TABS: 40.0000 mg | ORAL_TABLET | Freq: Every day | ORAL | 3 refills | Status: DC

## 2018-12-24 NOTE — Progress Notes (Signed)
I looked at her echocardiogram.  She has restrictive filling and her pulmonary pressures are high compared to January 2019 at [facility]. BNP recently high. I wonder if this is pulmonary venous hypertension.  I agree with continuing Lasix 40 mg daily with follow-up be met.  Would schedule pulmonary function tests, chest x-ray and VQ scan. Then I will have Debra schedule her to see me back. Brian Crenshaw  

## 2018-12-24 NOTE — Progress Notes (Signed)
Dr Stanford Breed can you look at these echo results- pt has severe pulmonary HTN.  It's [employer] mom.  She did have some improvement on increased Lasix last office visit.  I'll see her back in two weeks.  Any further recommendations ? Should we consider a pulmonary consult?   Lurena Joiner

## 2018-12-24 NOTE — Progress Notes (Signed)
The patient is a very pleasant 85 year old female who is now 4 months status post a right total hip arthroplasty.  She says she is doing great overall.  The hip has no pain.  We did inject her right shoulder about 3 months ago.  She says that she got good relief from the injection and solids were every once in a while.  On examination her right shoulder does move better.  Her right hip moves excellent.  Her leg lengths are equal.  X-rays of her pelvis and right hip show well-seated implant with no complicating features.  At this point follow-up can be as needed since she is doing so well.  She totally understands that she should call and come see [place] if she has any issues at all as it relates to her shoulder or her hip.  All question concerns were answered and addressed.

## 2018-12-24 NOTE — Telephone Encounter (Addendum)
vQ scan and cxr scheduled at [facility] 01-08-2019 @ 9:30 am, patient will need to arrive at 9:15 am. PFT scheduled 01/07/2019 @ 3:30 pm at [facility] covid testing will be 01-05-2019. Spoke with pt daughter, aware of above. New script for furosemide sent to the pharmacy and Lab orders mailed to the pt      I looked at her echocardiogram.  She has restrictive filling and her pulmonary pressures are high compared to January 2019 at [facility]. BNP recently high. I wonder if this is pulmonary venous hypertension.  I agree with continuing Lasix 40 mg daily with follow-up be met.  Would schedule pulmonary function tests, chest x-ray and VQ scan. Then I will have Hilda Blades schedule her to see me back. Kirk Ruths

## 2018-12-26 ENCOUNTER — Telehealth: Payer: Self-pay

## 2018-12-26 NOTE — Telephone Encounter (Signed)
7 day ZIO ordered and mailed to pt.  

## 2018-12-30 ENCOUNTER — Encounter (HOSPITAL_COMMUNITY): Payer: Medicare HMO

## 2018-12-31 ENCOUNTER — Ambulatory Visit (HOSPITAL_COMMUNITY): Payer: Medicare HMO

## 2019-01-03 ENCOUNTER — Ambulatory Visit (INDEPENDENT_AMBULATORY_CARE_PROVIDER_SITE_OTHER): Payer: Medicare HMO

## 2019-01-03 DIAGNOSIS — R0602 Shortness of breath: Secondary | ICD-10-CM

## 2019-01-03 DIAGNOSIS — I4821 Permanent atrial fibrillation: Secondary | ICD-10-CM

## 2019-01-03 DIAGNOSIS — N183 Chronic kidney disease, stage 3 unspecified: Secondary | ICD-10-CM

## 2019-01-03 DIAGNOSIS — Z7901 Long term (current) use of anticoagulants: Secondary | ICD-10-CM

## 2019-01-03 DIAGNOSIS — R001 Bradycardia, unspecified: Secondary | ICD-10-CM

## 2019-01-03 DIAGNOSIS — I10 Essential (primary) hypertension: Secondary | ICD-10-CM

## 2019-01-03 DIAGNOSIS — M1611 Unilateral primary osteoarthritis, right hip: Secondary | ICD-10-CM

## 2019-01-03 DIAGNOSIS — Z8673 Personal history of transient ischemic attack (TIA), and cerebral infarction without residual deficits: Secondary | ICD-10-CM

## 2019-01-05 ENCOUNTER — Other Ambulatory Visit (HOSPITAL_COMMUNITY)
Admission: RE | Admit: 2019-01-05 | Discharge: 2019-01-05 | Disposition: A | Discharge status: Home / Self Care | Payer: Medicare HMO | Source: Ambulatory Visit | Attending: Cardiology | Admitting: Cardiology

## 2019-01-05 ENCOUNTER — Other Ambulatory Visit (HOSPITAL_COMMUNITY): Payer: Medicare HMO

## 2019-01-05 DIAGNOSIS — Z01812 Encounter for preprocedural laboratory examination: Secondary | ICD-10-CM | POA: Insufficient documentation

## 2019-01-05 DIAGNOSIS — Z20828 Contact with and (suspected) exposure to other viral communicable diseases: Secondary | ICD-10-CM | POA: Diagnosis not present

## 2019-01-05 LAB — SARS CORONAVIRUS 2 (TAT 6-24 HRS): SARS Coronavirus 2: NEGATIVE

## 2019-01-07 ENCOUNTER — Other Ambulatory Visit: Payer: Self-pay

## 2019-01-07 ENCOUNTER — Ambulatory Visit: Payer: Medicare HMO | Admitting: Cardiology

## 2019-01-07 ENCOUNTER — Ambulatory Visit (HOSPITAL_COMMUNITY)
Admission: RE | Admit: 2019-01-07 | Discharge: 2019-01-07 | Disposition: A | Discharge status: Home / Self Care | Payer: Medicare HMO | Source: Ambulatory Visit | Attending: Cardiology | Admitting: Cardiology

## 2019-01-07 DIAGNOSIS — I272 Pulmonary hypertension, unspecified: Secondary | ICD-10-CM | POA: Insufficient documentation

## 2019-01-07 LAB — PULMONARY FUNCTION TEST
DL/VA % pred: 127 %
DL/VA: 5.34 ml/min/mmHg/L
DLCO unc % pred: 86 %
DLCO unc: 13.46 ml/min/mmHg
FEF 25-75 Post: 1.05 L/sec
FEF 25-75 Pre: 0.7 L/sec
FEF2575-%Change-Post: 50 %
FEF2575-%Pred-Post: 113 %
FEF2575-%Pred-Pre: 75 %
FEV1-%Change-Post: 11 %
FEV1-%Pred-Post: 87 %
FEV1-%Pred-Pre: 78 %
FEV1-Post: 1.18 L
FEV1-Pre: 1.05 L
FEV1FVC-%Change-Post: 7 %
FEV1FVC-%Pred-Pre: 98 %
FEV6-%Change-Post: 3 %
FEV6-%Pred-Post: 88 %
FEV6-%Pred-Pre: 85 %
FEV6-Post: 1.53 L
FEV6-Pre: 1.47 L
FEV6FVC-%Pred-Post: 107 %
FEV6FVC-%Pred-Pre: 107 %
FVC-%Change-Post: 3 %
FVC-%Pred-Post: 82 %
FVC-%Pred-Pre: 79 %
FVC-Post: 1.53 L
FVC-Pre: 1.47 L
Post FEV1/FVC ratio: 77 %
Post FEV6/FVC ratio: 100 %
Pre FEV1/FVC ratio: 72 %
Pre FEV6/FVC Ratio: 100 %
RV % pred: 76 %
RV: 1.7 L
TLC % pred: 74 %
TLC: 3.21 L

## 2019-01-07 MED ORDER — ALBUTEROL SULFATE (2.5 MG/3ML) 0.083% IN NEBU
2.5000 mg | INHALATION_SOLUTION | Freq: Once | RESPIRATORY_TRACT | Status: AC
  Administered 2019-01-07: 2.5 mg via RESPIRATORY_TRACT

## 2019-01-07 NOTE — Progress Notes (Signed)
HPI: FU atrial fibrillation.Echocardiogram January 2019 showed normal LV function, mild mitral regurgitation, moderate left atrial enlargement and mild tricuspid regurgitation. Patient was off of Eliquis for hip injection in January 2019 and suffered an embolic CVA. She has some gait instability from previous CVA.  Last echocardiogram November 2020 showed normal LV function, restrictive filling, mild biatrial enlargement, mild right ventricular enlargement with mildly reduced LV function, mild to moderate mitral regurgitation, moderate tricuspid regurgitation and severely elevated pulmonary pressures.  Patient seen with increased dyspnea and edema November 2020.  She was noted to be bradycardic and Lopressor was decreased.  BNP 2794 and mildly elevated TSH.  Monitor ordered.  VQ scan was negative.  Pulmonary function test showed minimal obstructive lung disease.  Since last seen,  her dyspnea has resolved.  No pedal edema.  She denies chest pain or syncope.  Current Outpatient Medications  Medication Sig Dispense Refill  . acetaminophen (TYLENOL 8 HOUR) 650 MG CR tablet Take 1,300 mg by mouth 2 (two) times daily.     Marland Kitchen apixaban (ELIQUIS) 5 MG TABS tablet Take 1 tablet (5 mg total) by mouth 2 (two) times daily. 60 tablet 12  . cimetidine (TAGAMET HB) 200 MG tablet Take 200 mg by mouth daily.     Marland Kitchen escitalopram (LEXAPRO) 20 MG tablet Take 20 mg by mouth daily.    . furosemide (LASIX) 20 MG tablet Take 2 tablets (40 mg total) by mouth daily. 180 tablet 3  . losartan (COZAAR) 100 MG tablet Take 1 tablet (100 mg total) by mouth daily. 90 tablet 3  . metoprolol tartrate (LOPRESSOR) 25 MG tablet Take 1 tablet (25 mg total) by mouth 2 (two) times daily. 60 tablet 3  . Multiple Vitamin (MULTIVITAMIN) tablet Take 1 tablet by mouth daily.    . Probiotic Product (PROBIOTIC DAILY PO) Take 1 capsule by mouth daily.     . rosuvastatin (CRESTOR) 10 MG tablet Take 5 mg by mouth 2 (two) times a week.    .  traMADol (ULTRAM) 50 MG tablet Take 1-2 tablets (50-100 mg total) by mouth every 6 (six) hours as needed. 40 tablet 0  . trolamine salicylate (ASPERCREME) 10 % cream Apply 1 application topically as needed for muscle pain.     No current facility-administered medications for this visit.      Past Medical History:  Diagnosis Date  . Atrial fibrillation ([place])   . Chronic kidney disease, stage 3   . CVA (cerebral vascular accident) ([place])   . GERD (gastroesophageal reflux disease)   . Hyperlipidemia   . Hypertension     Past Surgical History:  Procedure Laterality Date  . carpel tunnel Right   . CHOLECYSTECTOMY    . TOTAL HIP ARTHROPLASTY Right 08/12/2018   Procedure: RIGHT TOTAL HIP ARTHROPLASTY ANTERIOR APPROACH;  Surgeon: Mcarthur Rossetti, MD;  Location: [place];  Service: Orthopedics;  Laterality: Right;    Social History   Socioeconomic History  . Marital status: Widowed    Spouse name: Not on file  . Number of children: 3  . Years of education: Not on file  . Highest education level: Not on file  Occupational History  . Not on file  Social Needs  . Financial resource strain: Not on file  . Food insecurity    Worry: Not on file    Inability: Not on file  . Transportation needs    Medical: Not on file    Non-medical: Not on file  Tobacco Use  . Smoking status: Former Games developer  . Smokeless tobacco: Never Used  Substance and Sexual Activity  . Alcohol use: Not Currently  . Drug use: Never  . Sexual activity: Not on file  Lifestyle  . Physical activity    Days per week: Not on file    Minutes per session: Not on file  . Stress: Not on file  Relationships  . Social Musician on phone: Not on file    Gets together: Not on file    Attends religious service: Not on file    Active member of club or organization: Not on file    Attends meetings of clubs or organizations: Not on file    Relationship status: Not on file  . Intimate partner violence     Fear of current or ex partner: Not on file    Emotionally abused: Not on file    Physically abused: Not on file    Forced sexual activity: Not on file  Other Topics Concern  . Not on file  Social History Narrative  . Not on file    Family History  Problem Relation Age of Onset  . Stomach cancer Mother   . CAD Father     ROS: no fevers or chills, productive cough, hemoptysis, dysphasia, odynophagia, melena, hematochezia, dysuria, hematuria, rash, seizure activity, orthopnea, PND, pedal edema, claudication. Remaining systems are negative.  Physical Exam: Well-developed well-nourished in no acute distress.  Skin is warm and dry.  HEENT is normal.  Neck is supple.  Chest is clear to auscultation with normal expansion.  Cardiovascular exam is irregular Abdominal exam nontender or distended. No masses palpated. Extremities show no edema. neuro grossly intact  A/P  1 permanent atrial fibrillation-continue beta-blocker for rate control.  She did have some bradycardia previously.  Monitor is pending at this time.  We may need to discontinue metoprolol completely pending results.  Continue apixaban.  Note she had a previous CVA when her apixaban was held for procedure.  She will need Lovenox bridge when off of apixaban in the future.  2 pulmonary hypertension-likely from pulmonary venous hypertension.  Continue present dose of Lasix.  Previous VQ scan and pulmonary function tests unrevealing.  Would like to be conservative given patient's age.  3 hypertension-patient's blood pressure is mildly elevated.  We will follow and advance regimen as needed.  4 hyperlipidemia-continue statin.  5 chronic diastolic congestive heart failure-symptoms are much improved.  Continue present dose of Lasix.  We discussed fluid restriction and low-sodium diet.  Check potassium and renal function.  6 elevated TSH-check free T4.  Follow-up primary care.  Olga Millers, MD

## 2019-01-08 ENCOUNTER — Encounter (HOSPITAL_COMMUNITY)
Admission: RE | Admit: 2019-01-08 | Discharge: 2019-01-08 | Disposition: A | Discharge status: Home / Self Care | Payer: Medicare HMO | Source: Ambulatory Visit | Attending: Cardiology | Admitting: Cardiology

## 2019-01-08 ENCOUNTER — Other Ambulatory Visit: Payer: Self-pay | Admitting: Cardiology

## 2019-01-08 ENCOUNTER — Ambulatory Visit (HOSPITAL_COMMUNITY)
Admission: RE | Admit: 2019-01-08 | Discharge: 2019-01-08 | Disposition: A | Discharge status: Home / Self Care | Payer: Medicare HMO | Source: Ambulatory Visit | Attending: Cardiology | Admitting: Cardiology

## 2019-01-08 ENCOUNTER — Other Ambulatory Visit: Payer: Self-pay

## 2019-01-08 DIAGNOSIS — I272 Pulmonary hypertension, unspecified: Secondary | ICD-10-CM | POA: Insufficient documentation

## 2019-01-08 MED ORDER — TECHNETIUM TO 99M ALBUMIN AGGREGATED
1.4500 | Freq: Once | INTRAVENOUS | Status: AC | PRN
  Administered 2019-01-08: 1.45 via INTRAVENOUS

## 2019-01-14 ENCOUNTER — Other Ambulatory Visit: Payer: Self-pay

## 2019-01-14 ENCOUNTER — Ambulatory Visit (INDEPENDENT_AMBULATORY_CARE_PROVIDER_SITE_OTHER): Payer: Medicare HMO | Admitting: Cardiology

## 2019-01-14 ENCOUNTER — Encounter: Payer: Self-pay | Admitting: Cardiology

## 2019-01-14 VITALS — BP 147/63 | HR 50 | Temp 97.3°F | Ht 59.0 in | Wt 144.0 lb

## 2019-01-14 DIAGNOSIS — I4821 Permanent atrial fibrillation: Secondary | ICD-10-CM

## 2019-01-14 DIAGNOSIS — I272 Pulmonary hypertension, unspecified: Secondary | ICD-10-CM

## 2019-01-14 DIAGNOSIS — I10 Essential (primary) hypertension: Secondary | ICD-10-CM

## 2019-01-14 NOTE — Patient Instructions (Signed)
Medication Instructions:  NO CHANGES *If you need a refill on your cardiac medications before your next appointment, please call your pharmacy*  Lab Work: Your physician recommends that you HAVE LABS DRAWN TODAY.   If you have labs (blood work) drawn today and your tests are completely normal, you will receive your results only by: Marland Kitchen MyChart Message (if you have MyChart) OR . A paper copy in the mail If you have any lab test that is abnormal or we need to change your treatment, we will call you to review the results.   Follow-Up: At [facility], you and your health needs are our priority.  As part of our continuing mission to provide you with exceptional heart care, we have created designated Provider Care Teams.  These Care Teams include your primary Cardiologist (physician) and Advanced Practice Providers (APPs -  Physician Assistants and Nurse Practitioners) who all work together to provide you with the care you need, when you need it.  Your next appointment:   3 month(s)  The format for your next appointment:   Either In Person or Virtual  Provider:   You may see DR. CRENSHAW  or one of the following Advanced Practice Providers on your designated Care Team:    Kerin Ransom, PA-C  [place], [place]  Coletta Memos, [place]

## 2019-01-15 LAB — T4, FREE: Free T4: 1.14 ng/dL (ref 0.82–1.77)

## 2019-01-15 LAB — BASIC METABOLIC PANEL
BUN/Creatinine Ratio: 16 (ref 12–28)
BUN: 20 mg/dL (ref 8–27)
CO2: 24 mmol/L (ref 20–29)
Calcium: 9.8 mg/dL (ref 8.7–10.3)
Chloride: 103 mmol/L (ref 96–106)
Creatinine, Ser: 1.26 mg/dL — ABNORMAL HIGH (ref 0.57–1.00)
GFR calc Af Amer: 45 mL/min/{1.73_m2} — ABNORMAL LOW (ref >59)
GFR calc non Af Amer: 39 mL/min/{1.73_m2} — ABNORMAL LOW (ref >59)
Glucose: 95 mg/dL (ref 65–99)
Potassium: 5 mmol/L (ref 3.5–5.2)
Sodium: 144 mmol/L (ref 134–144)

## 2019-01-19 ENCOUNTER — Ambulatory Visit: Payer: Medicare HMO | Admitting: Family

## 2019-01-20 ENCOUNTER — Telehealth: Payer: Self-pay | Admitting: *Deleted

## 2019-01-20 NOTE — Telephone Encounter (Signed)
-----   Message from Lelon Perla, MD sent at 01/20/2019  9:08 AM EST ----- DC metoprolol and follow HR Kirk Ruths

## 2019-01-26 ENCOUNTER — Ambulatory Visit: Payer: Medicare HMO | Admitting: Physician Assistant

## 2019-02-02 ENCOUNTER — Ambulatory Visit (INDEPENDENT_AMBULATORY_CARE_PROVIDER_SITE_OTHER): Payer: Medicare HMO | Admitting: Orthopaedic Surgery

## 2019-02-02 ENCOUNTER — Encounter: Payer: Self-pay | Admitting: Orthopaedic Surgery

## 2019-02-02 ENCOUNTER — Telehealth: Payer: Self-pay | Admitting: Cardiology

## 2019-02-02 VITALS — BP 155/81 | HR 81

## 2019-02-02 DIAGNOSIS — G8929 Other chronic pain: Secondary | ICD-10-CM

## 2019-02-02 DIAGNOSIS — M25511 Pain in right shoulder: Secondary | ICD-10-CM

## 2019-02-02 MED ORDER — AMLODIPINE BESYLATE 5 MG PO TABS: 5.0000 mg | ORAL_TABLET | Freq: Every day | ORAL | 11 refills | Status: DC

## 2019-02-02 MED ORDER — METHYLPREDNISOLONE ACETATE 40 MG/ML IJ SUSP
40.0000 mg | INTRAMUSCULAR | Status: AC | PRN
  Administered 2019-02-02: 40 mg via INTRA_ARTICULAR

## 2019-02-02 MED ORDER — LIDOCAINE HCL 1 % IJ SOLN
3.0000 mL | INTRAMUSCULAR | Status: AC | PRN
  Administered 2019-02-02: 3 mL

## 2019-02-02 NOTE — Telephone Encounter (Signed)
Contacted by the patient's family.  They have noticed the patient's B/P has been running high since Metoprolol was discontinued -[id number] systolic. I have recommended Amlodipine 5 mg be started and I will see her in a virtual visit in a couple of weeks to see how she is doing with this.  Kerin Ransom PA-C 02/02/2019 2:35 PM

## 2019-02-02 NOTE — Progress Notes (Signed)
Office Visit Note   Patient: Lorraine Page           Date of Birth: [birth year]           MRN: [id number] Visit Date: 02/02/2019              Requested by: Brooke Bonito, MD No address on file PCP: Brooke Bonito, MD   Assessment & Plan: Visit Diagnoses:  1. Chronic right shoulder pain     Plan: Since a shoulder injection has helped in the past I recommended another 1 today.  She tolerated it well.  Follow-up will be as needed.  However I did give it 3 months for considering a repeat injection and at her next visit if she needs a repeat injection I would probably provide 1 in the subacromial space and 1 in the [facility] joint.  All question concerns were answered and addressed.  Follow-up as otherwise as needed.  Follow-Up Instructions: Return if symptoms worsen or fail to improve.   Orders:  Orders Placed This Encounter  Procedures  . Large Joint Inj   No orders of the defined types were placed in this encounter.     Procedures: Large Joint Inj: R subacromial bursa on 02/02/2019 2:34 PM Indications: pain and diagnostic evaluation Details: 22 G 1.5 in needle  Arthrogram: No  Medications: 3 mL lidocaine 1 %; 40 mg methylPREDNISolone acetate 40 MG/ML Outcome: tolerated well, no immediate complications Procedure, treatment alternatives, risks and benefits explained, specific risks discussed. Consent was given by the patient. Immediately prior to procedure a time out was called to verify the correct patient, procedure, equipment, support staff and site/side marked as required. Patient was prepped and draped in the usual sterile fashion.       Clinical Data: No additional findings.   Subjective: Chief Complaint  Patient presents with  . Right Shoulder - Pain  Lorraine Page is now 5 and half months status post a right total hip arthroplasty.  That has done very well for her.  She still having issues with the right shoulder pain.  Most of her pain is from significant  tendinitis around the rotator cuff and likely some chronic tearing of the rotator cuff.  There is also known AC joint arthritis on previous films.  She says her hip is doing very well with the shoulders was bothering her the worst.  She does ambulate with a cane.  She is an active 85 years old.  Her blood pressure is up in the office today and they were on the phone with cardiology working on adjustments to her blood pressure medications as we speak.  HPI  Review of Systems She currently denies any headache, chest pain, shortness of breath, fever, chills, nausea, vomiting  Objective: Vital Signs: BP (!) 155/81 (BP Location: Left Arm, Patient Position: Sitting, Cuff Size: Normal)   Pulse 81   Physical Exam She is alert and orient x3 and in no acute distress Ortho Exam Examination of her right shoulder still shows significant pain with positive Neer and Hawkins signs and weakness of the rotator cuff.  The shoulder is well located. Specialty Comments:  No specialty comments available.  Imaging: No results found.   PMFS History: Patient Active Problem List   Diagnosis Date Noted  . Permanent atrial fibrillation (HCC) 12/16/2018  . Chronic anticoagulation 12/16/2018  . H/O: CVA (cerebrovascular accident) 12/16/2018  . CRI (chronic renal insufficiency), stage 3 (moderate) 12/16/2018  . Essential hypertension 12/16/2018  . GERD (gastroesophageal  reflux disease) 12/16/2018  . Shortness of breath 12/16/2018  . Status post total replacement of right hip 08/12/2018  . Degenerative joint disease (DJD) of hip 03/19/2018   Past Medical History:  Diagnosis Date  . Atrial fibrillation ([place])   . Chronic kidney disease, stage 3   . CVA (cerebral vascular accident) ([place])   . GERD (gastroesophageal reflux disease)   . Hyperlipidemia   . Hypertension     Family History  Problem Relation Age of Onset  . Stomach cancer Mother   . CAD Father     Past Surgical History:  Procedure Laterality  Date  . carpel tunnel Right   . CHOLECYSTECTOMY    . TOTAL HIP ARTHROPLASTY Right 08/12/2018   Procedure: RIGHT TOTAL HIP ARTHROPLASTY ANTERIOR APPROACH;  Surgeon: Mcarthur Rossetti, MD;  Location: [facility];  Service: Orthopedics;  Laterality: Right;   Social History   Occupational History  . Not on file  Tobacco Use  . Smoking status: Former Research scientist (life sciences)  . Smokeless tobacco: Never Used  Substance and Sexual Activity  . Alcohol use: Not Currently  . Drug use: Never  . Sexual activity: Not on file

## 2019-02-09 ENCOUNTER — Encounter: Payer: Self-pay | Admitting: Orthopaedic Surgery

## 2019-02-09 ENCOUNTER — Telehealth: Payer: Self-pay

## 2019-02-09 NOTE — Telephone Encounter (Signed)
Left message for patients daughter to contact office to schedule virtual appt for blood pressure check.

## 2019-02-09 NOTE — Telephone Encounter (Signed)
-----   Message from Abelino Derrick, [place] sent at 02/02/2019  2:37 PM EST ----- T can you arrange for a virtual follow up in two weeks for this patient-  Thanks  Corine Shelter PA-C 02/02/2019 2:37 PM

## 2019-02-11 ENCOUNTER — Encounter: Payer: Self-pay | Admitting: Cardiology

## 2019-02-11 ENCOUNTER — Telehealth: Payer: Self-pay

## 2019-02-11 ENCOUNTER — Telehealth (INDEPENDENT_AMBULATORY_CARE_PROVIDER_SITE_OTHER): Payer: Medicare HMO | Admitting: Cardiology

## 2019-02-11 VITALS — BP 162/75 | HR 80 | Ht 60.0 in | Wt 140.0 lb

## 2019-02-11 DIAGNOSIS — I272 Pulmonary hypertension, unspecified: Secondary | ICD-10-CM | POA: Insufficient documentation

## 2019-02-11 DIAGNOSIS — Z7901 Long term (current) use of anticoagulants: Secondary | ICD-10-CM

## 2019-02-11 DIAGNOSIS — I10 Essential (primary) hypertension: Secondary | ICD-10-CM | POA: Diagnosis not present

## 2019-02-11 DIAGNOSIS — Z8673 Personal history of transient ischemic attack (TIA), and cerebral infarction without residual deficits: Secondary | ICD-10-CM

## 2019-02-11 DIAGNOSIS — I482 Chronic atrial fibrillation, unspecified: Secondary | ICD-10-CM | POA: Diagnosis not present

## 2019-02-11 DIAGNOSIS — I69320 Aphasia following cerebral infarction: Secondary | ICD-10-CM

## 2019-02-11 DIAGNOSIS — Z7189 Other specified counseling: Secondary | ICD-10-CM

## 2019-02-11 DIAGNOSIS — I4821 Permanent atrial fibrillation: Secondary | ICD-10-CM

## 2019-02-11 MED ORDER — AMLODIPINE BESYLATE 10 MG PO TABS: 10.0000 mg | ORAL_TABLET | Freq: Every day | ORAL | 3 refills | Status: DC

## 2019-02-11 NOTE — Telephone Encounter (Signed)
Left a voice message for the patient reminding her of her virtual visit with Corine Shelter, PA-C and that I will be calling again.

## 2019-02-11 NOTE — Progress Notes (Signed)
Virtual Visit via Telephone Note   This visit type was conducted due to national recommendations for restrictions regarding the COVID-19 Pandemic (e.g. social distancing) in an effort to limit this patient's exposure and mitigate transmission in our community.  Due to her co-morbid illnesses, this patient is at least at moderate risk for complications without adequate follow up.  This format is felt to be most appropriate for this patient at this time.  The patient did not have access to video technology/had technical difficulties with video requiring transitioning to audio format only (telephone).  All issues noted in this document were discussed and addressed.  No physical exam could be performed with this format.  Please refer to the patient's chart for her  consent to telehealth for [facility].   Date:  02/11/2019   ID:  Lorraine Page, DOB 09/11/1932, MRN [id number]  Patient Location: Home Provider Location: Home  PCP:  Brooke Bonito, MD  Cardiologist:  Dr Jens Som Electrophysiologist:  None   Evaluation Performed:  Follow-Up Visit  Chief Complaint:  none  History of Present Illness:    Lorraine Page is a 86 y.o. female with a history of "chronic atrial fibrillation".  She was [facility] by a [facility]ologist in [facility].  She saw Dr. Jens Som in June 2020 for the first time.  The patient had been on Eliquis and in January 2019 she had an embolic stroke while she was off Eliquis for hip injection.  She has some residual expressive aphasia.  In the future Lovenox bridging is recommended.  Patient did ultimately have her hip replaced by Dr. Magnus Ivan August 12, 2018.  Other medical issues include chronic renal insufficiency stage III with a GFR of 52, hypertension, dyslipidemia and GERD.  She was evaluated for increased dyspnea in November.  An echo showed severe pulmonary hypertension. VQ was negative for PE. PFTs showed minimal obstructive lung disease.  He symptoms did improve  with diuretics and decreasing her beta blocker.  Recently she has noted increasing B/P. Amlodipine 5 mg was added 02/02/2019. She was contacted today by phone. I spoke with her and her daughter on speaker phone. Her B/P remains high- 140-160 systolic.  She also has had pain from her shoulder and received an injection from Dr Magnus Ivan. She denies any significant edema or SOB.   The patient does not have symptoms concerning for COVID-19 infection (fever, chills, cough, or new shortness of breath).    Past Medical History:  Diagnosis Date  . Atrial fibrillation (HCC)   . Chronic kidney disease, stage 3   . CVA (cerebral vascular accident) (HCC)   . GERD (gastroesophageal reflux disease)   . Hyperlipidemia   . Hypertension    Past Surgical History:  Procedure Laterality Date  . carpel tunnel Right   . CHOLECYSTECTOMY    . TOTAL HIP ARTHROPLASTY Right 08/12/2018   Procedure: RIGHT TOTAL HIP ARTHROPLASTY ANTERIOR APPROACH;  Surgeon: Kathryne Hitch, MD;  Location: [facility];  Service: Orthopedics;  Laterality: Right;     Current Meds  Medication Sig  . acetaminophen (TYLENOL 8 HOUR) 650 MG CR tablet Take 650 mg by mouth 2 (two) times daily.   Marland Kitchen amLODipine (NORVASC) 10 MG tablet Take 1 tablet (10 mg total) by mouth daily.  Marland Kitchen apixaban (ELIQUIS) 5 MG TABS tablet Take 1 tablet (5 mg total) by mouth 2 (two) times daily.  . cimetidine (TAGAMET HB) 200 MG tablet Take 200 mg by mouth daily.   . diclofenac Sodium (VOLTAREN)  1 % GEL Apply topically 4 (four) times daily.  Marland Kitchen escitalopram (LEXAPRO) 20 MG tablet Take 20 mg by mouth daily.  . furosemide (LASIX) 20 MG tablet Take 2 tablets (40 mg total) by mouth daily.  Marland Kitchen losartan (COZAAR) 100 MG tablet Take 1 tablet (100 mg total) by mouth daily.  . Multiple Vitamin (MULTIVITAMIN) tablet Take 1 tablet by mouth daily.  . Probiotic Product (PROBIOTIC DAILY PO) Take 1 capsule by mouth daily.   . rosuvastatin (CRESTOR) 10 MG tablet Take 5 mg by mouth 2  (two) times a week.  . traMADol (ULTRAM) 50 MG tablet Take 1-2 tablets (50-100 mg total) by mouth every 6 (six) hours as needed.  . trolamine salicylate (ASPERCREME) 10 % cream Apply 1 application topically as needed for muscle pain.  . [DISCONTINUED] amLODipine (NORVASC) 5 MG tablet Take 1 tablet (5 mg total) by mouth daily.     Allergies:   Penicillins and Sulfa antibiotics   Social History   Tobacco Use  . Smoking status: Former Games developer  . Smokeless tobacco: Never Used  Substance Use Topics  . Alcohol use: Not Currently  . Drug use: Never     Family Hx: The patient's family history includes CAD in her father; Stomach cancer in her mother.  ROS:   Please see the history of present illness.    All other systems reviewed and are negative.   Prior CV studies:   The following studies were reviewed today: Echo 12/23/2018  Labs/Other Tests and Data Reviewed:    EKG:  No ECG reviewed.  Recent Labs: 12/16/2018: BNP 2,794.4; Hemoglobin 15.1; Magnesium 2.2; Platelets 220; TSH 5.900 01/14/2019: BUN 20; Creatinine, Ser 1.26; Potassium 5.0; Sodium 144   Recent Lipid Panel No results found for: CHOL, TRIG, HDL, CHOLHDL, LDLCALC, LDLDIRECT  Wt Readings from Last 3 Encounters:  02/11/19 140 lb (63.5 kg)  01/14/19 144 lb (65.3 kg)  12/24/18 145 lb (65.8 kg)     Objective:    Vital Signs:  BP (!) 162/75   Pulse 80   Ht 5' (1.524 m)   Wt 140 lb (63.5 kg)   BMI 27.34 kg/m    VITAL SIGNS:  reviewed  ASSESSMENT & PLAN:    Severe pulmonary HTN- Conservative Rx based on patient's age and co morbidities  CAF- Dyspnea improved off beta blocker- her HR has been in the 70's  Chronic anticoagulation On Eliquis- she will require Lovenox crossover if she ends up needing shoulder surgery  HTN- Still not well controlled.  Increase Amlodipine to 10 mg.  H/O: CVA (cerebrovascular accident) Jan 2019- residual expressive aphasia  Plan: Increase Amlodipine to 10 mg. Virtual  follow up 4 weeks.   COVID-19 Education: The signs and symptoms of COVID-19 were discussed with the patient and how to seek care for testing (follow up with PCP or arrange E-visit).  The importance of social distancing was discussed today.  Time:   Today, I have spent 15 minutes with the patient with telehealth technology discussing the above problems.     Medication Adjustments/Labs and Tests Ordered: Current medicines are reviewed at length with the patient today.  Concerns regarding medicines are outlined above.   Tests Ordered: No orders of the defined types were placed in this encounter.   Medication Changes: Meds ordered this encounter  Medications  . amLODipine (NORVASC) 10 MG tablet    Sig: Take 1 tablet (10 mg total) by mouth daily.    Dispense:  30 tablet  Refill:  3    Follow Up:  Virtual Visit  Virtual f/u with me 4 weeks.   Angelena Form, PA-C  02/11/2019 4:28 PM    [facility]

## 2019-02-11 NOTE — Patient Instructions (Signed)
Medication Instructions:   INCREASE AMLODIPINE TO 10 MG DAILY  *If you need a refill on your cardiac medications before your next appointment, please call your pharmacy*  Lab Work: NONE ordered at this time of appointment   If you have labs (blood work) drawn today and your tests are completely normal, you will receive your results only by: Marland Kitchen MyChart Message (if you have MyChart) OR . A paper copy in the mail If you have any lab test that is abnormal or we need to change your treatment, we will call you to review the results.  Testing/Procedures: NONE ordered at this time of appointment   Follow-Up: At [facility], you and your health needs are our priority.  As part of our continuing mission to provide you with exceptional heart care, we have created designated Provider Care Teams.  These Care Teams include your primary Cardiologist (physician) and Advanced Practice Providers (APPs -  Physician Assistants and Nurse Practitioners) who all work together to provide you with the care you need, when you need it.  Your next appointment:   3-4 week(s)  The format for your next appointment:   Virtual Visit   Provider:   Corine Shelter, PA-C  Other Instructions

## 2019-02-23 ENCOUNTER — Encounter: Payer: Self-pay | Admitting: Orthopaedic Surgery

## 2019-02-24 ENCOUNTER — Other Ambulatory Visit: Payer: Self-pay | Admitting: Physician Assistant

## 2019-02-24 MED ORDER — TRAMADOL HCL 50 MG PO TABS: 50.0000 mg | ORAL_TABLET | Freq: Four times a day (QID) | ORAL | 0 refills | Status: DC | PRN

## 2019-02-24 NOTE — Telephone Encounter (Signed)
I will send in Tramadol . Please find out where they would like to go to PT.

## 2019-03-09 ENCOUNTER — Telehealth: Payer: Self-pay

## 2019-03-09 ENCOUNTER — Encounter: Payer: Self-pay | Admitting: Cardiology

## 2019-03-09 ENCOUNTER — Telehealth (INDEPENDENT_AMBULATORY_CARE_PROVIDER_SITE_OTHER): Payer: Medicare HMO | Admitting: Cardiology

## 2019-03-09 VITALS — BP 145/85 | HR 90 | Ht 59.0 in | Wt 143.0 lb

## 2019-03-09 DIAGNOSIS — Z7901 Long term (current) use of anticoagulants: Secondary | ICD-10-CM

## 2019-03-09 DIAGNOSIS — I272 Pulmonary hypertension, unspecified: Secondary | ICD-10-CM

## 2019-03-09 DIAGNOSIS — I4821 Permanent atrial fibrillation: Secondary | ICD-10-CM

## 2019-03-09 DIAGNOSIS — I10 Essential (primary) hypertension: Secondary | ICD-10-CM | POA: Diagnosis not present

## 2019-03-09 DIAGNOSIS — N183 Chronic kidney disease, stage 3 unspecified: Secondary | ICD-10-CM

## 2019-03-09 DIAGNOSIS — Z8673 Personal history of transient ischemic attack (TIA), and cerebral infarction without residual deficits: Secondary | ICD-10-CM

## 2019-03-09 MED ORDER — HYDRALAZINE HCL 25 MG PO TABS: 25.0000 mg | ORAL_TABLET | Freq: Two times a day (BID) | ORAL | 3 refills | Status: DC

## 2019-03-09 NOTE — Telephone Encounter (Signed)
Contacted patient's daughter [name], to discuss AVS Instructions. Gave patient Lorraine Page's recommendations from today's virtual office visit. Informed patient to follow up as scheduled. Patient's daughter [name], voiced understanding; AVS printed and mailed to patient.

## 2019-03-09 NOTE — Progress Notes (Signed)
Virtual Visit via Telephone Note   This visit type was conducted due to national recommendations for restrictions regarding the COVID-19 Pandemic (e.g. social distancing) in an effort to limit this patient's exposure and mitigate transmission in our community.  Due to her co-morbid illnesses, this patient is at least at moderate risk for complications without adequate follow up.  This format is felt to be most appropriate for this patient at this time.  The patient did not have access to video technology/had technical difficulties with video requiring transitioning to audio format only (telephone).  All issues noted in this document were discussed and addressed.  No physical exam could be performed with this format.  Please refer to the patient's chart for her  consent to telehealth for [facility].   Date:  03/09/2019   ID:  Lorraine Page, DOB 1932-09-15, MRN [id number]  Patient Location: Home Provider Location: Home  PCP:  Reita Cliche, MD  Cardiologist:  Kirk Ruths, MD  Electrophysiologist:  None   Evaluation Performed:  Follow-Up Visit  Chief Complaint:  none  History of Present Illness:    Lorraine Page is a 86 y.o. female with a history of "chronic atrial fibrillation". She was previoulsy followed by a cardiologist in [facility].The patient had been on Eliquis and in January 7353 she had an embolic stroke while she was off Eliquis for hip injection. She has some residual expressive aphasia. In the future Lovenox bridging is recommended. the patient did ultimately have her hip replaced by Dr. Ninfa [facility] August 12, 2018. Other medical issues include chronic renal insufficiency stage III with a GFR of 52, hypertension, dyslipidemia and GERD.  She was evaluated for increased dyspnea in November 2020.  An echo showed severe pulmonary hypertension. VQ was negative for PE. PFTs showed minimal obstructive lung disease.  Her symptoms did improve with diuretics and decreasing  her beta blocker. She had a monitor in Dec 2020 and had bradycardia and a pause of 4.7 seconds and her beta blocker was discontinued altogether.   I added Norvasc 5 mg in Dec 2020 for HTN, this was increased to 10 mg on 02/11/2019.  She was contacted today for follow up.  Her daughter was with her on the phone.  The patient's B/P has been running 299-242 systolic.  They report a HR of 90.  The patient is unaware of AF or tachycardia.  Her main complaint is her shoulder, it sounds like she has rotator cuff issues but is reluctant to have surgery.   The patient does not have symptoms concerning for COVID-19 infection (fever, chills, cough, or new shortness of breath).    Past Medical History:  Diagnosis Date  . Atrial fibrillation ([place])   . Chronic kidney disease, stage 3   . CVA (cerebral vascular accident) ([place])   . GERD (gastroesophageal reflux disease)   . Hyperlipidemia   . Hypertension    Past Surgical History:  Procedure Laterality Date  . carpel tunnel Right   . CHOLECYSTECTOMY    . TOTAL HIP ARTHROPLASTY Right 08/12/2018   Procedure: RIGHT TOTAL HIP ARTHROPLASTY ANTERIOR APPROACH;  Surgeon: Mcarthur Rossetti, MD;  Location: [facility];  Service: Orthopedics;  Laterality: Right;     Current Meds  Medication Sig  . acetaminophen (TYLENOL 8 HOUR) 650 MG CR tablet Take 1,300 mg by mouth 2 (two) times daily.   Marland Kitchen amLODipine (NORVASC) 10 MG tablet Take 1 tablet (10 mg total) by mouth daily.  Marland Kitchen apixaban (ELIQUIS) 5 MG  TABS tablet Take 1 tablet (5 mg total) by mouth 2 (two) times daily.  . cimetidine (TAGAMET HB) 200 MG tablet Take 200 mg by mouth daily.   Marland Kitchen escitalopram (LEXAPRO) 20 MG tablet Take 20 mg by mouth daily.  . furosemide (LASIX) 20 MG tablet Take 2 tablets (40 mg total) by mouth daily.  Marland Kitchen losartan (COZAAR) 100 MG tablet Take 1 tablet (100 mg total) by mouth daily.  . Multiple Vitamin (MULTIVITAMIN) tablet Take 1 tablet by mouth daily.  . Probiotic Product (PROBIOTIC DAILY  PO) Take 1 capsule by mouth daily.   . rosuvastatin (CRESTOR) 10 MG tablet Take 5 mg by mouth 2 (two) times a week.  . traMADol (ULTRAM) 50 MG tablet Take 1 tablet (50 mg total) by mouth every 6 (six) hours as needed.  . trolamine salicylate (ASPERCREME) 10 % cream Apply 1 application topically as needed for muscle pain.     Allergies:   Penicillins and Sulfa antibiotics   Social History   Tobacco Use  . Smoking status: Former Games developer  . Smokeless tobacco: Never Used  Substance Use Topics  . Alcohol use: Not Currently  . Drug use: Never     Family Hx: The patient's family history includes CAD in her father; Stomach cancer in her mother.  ROS:   Please see the history of present illness.    All other systems reviewed and are negative.   Prior CV studies:   The following studies were reviewed today:  Echo 12/23/2018  Labs/Other Tests and Data Reviewed:    EKG:  An ECG dated 12/16/2018 was personally reviewed today and demonstrated:  bradycardia- 40's  Recent Labs: 12/16/2018: BNP 2,794.4; Hemoglobin 15.1; Magnesium 2.2; Platelets 220; TSH 5.900 01/14/2019: BUN 20; Creatinine, Ser 1.26; Potassium 5.0; Sodium 144   Recent Lipid Panel No results found for: CHOL, TRIG, HDL, CHOLHDL, LDLCALC, LDLDIRECT  Wt Readings from Last 3 Encounters:  03/09/19 143 lb (64.9 kg)  02/11/19 140 lb (63.5 kg)  01/14/19 144 lb (65.3 kg)     Objective:    Vital Signs:  BP (!) 145/85   Pulse 90   Ht 4\' 11"  (1.499 m)   Wt 143 lb (64.9 kg)   BMI 28.88 kg/m    VITAL SIGNS:  reviewed  ASSESSMENT & PLAN:    Severe pulmonary HTN- Conservative Rx based on patient's age and co morbidities  CAF- Dyspnea improved off beta blocker- her HR has been in the 90's per the patients daughter- no complaints of tachycardia or palpitations  Chronic anticoagulation On Eliquis- she will require Lovenox crossover if she ends up needing shoulder surgery  HTN- Still not well controlled.  Add  hydralazine 25 mg BID  H/O: CVA (cerebrovascular accident) Jan 2019- residual expressive aphasia  COVID-19 Education: The signs and symptoms of COVID-19 were discussed with the patient and how to seek care for testing (follow up with PCP or arrange E-visit).  The importance of social distancing was discussed today.  Time:   Today, I have spent 15 minutes with the patient with telehealth technology discussing the above problems.     Medication Adjustments/Labs and Tests Ordered: Current medicines are reviewed at length with the patient today.  Concerns regarding medicines are outlined above.   Tests Ordered: No orders of the defined types were placed in this encounter.   Medication Changes: No orders of the defined types were placed in this encounter.   Follow Up:  In Person Keep f/u as scheduled with Dr  Crenshaw-the patient's daughter will need to accompnay the patient  Signed, Sharee Pimple  03/09/2019 4:06 PM    [facility]

## 2019-03-09 NOTE — Telephone Encounter (Signed)

## 2019-03-09 NOTE — Patient Instructions (Addendum)
Medication Instructions:  START Hydralazine 25mg  Take 1 tablet twice a day  TAKE Norvasc (Amlodipine) in the mornings *If you need a refill on your cardiac medications before your next appointment, please call your pharmacy*  Lab Work: None  If you have labs (blood work) drawn today and your tests are completely normal, you will receive your results only by: MyChart Message (if you have MyChart) OR . A paper copy in the mail If you have any lab test that is abnormal or we need to change your treatment, we will call you to review the results.  Testing/Procedures: None   Follow-Up: At [facility], you and your health needs are our priority.  As part of our continuing mission to provide you with exceptional heart care, we have created designated Provider Care Teams.  These Care Teams include your primary Cardiologist (physician) and Advanced Practice Providers (APPs -  Physician Assistants and Nurse Practitioners) who all work together to provide you with the care you need, when you need it.  Your next appointment:    FOLLOW UP AS SCHEDULED  The format for your next appointment:   In Person  Provider:   [facility], MD  Other Instructions

## 2019-03-09 NOTE — Telephone Encounter (Signed)
Calling to get patients virtual visit started. Left message waiting for patients daughter to contact the office.

## 2019-03-19 ENCOUNTER — Ambulatory Visit: Payer: Medicare HMO | Admitting: Physician Assistant

## 2019-03-25 ENCOUNTER — Ambulatory Visit (INDEPENDENT_AMBULATORY_CARE_PROVIDER_SITE_OTHER): Payer: Medicare HMO

## 2019-03-25 ENCOUNTER — Encounter: Payer: Self-pay | Admitting: Physician Assistant

## 2019-03-25 ENCOUNTER — Other Ambulatory Visit: Payer: Self-pay

## 2019-03-25 ENCOUNTER — Ambulatory Visit: Payer: Medicare HMO | Admitting: Physician Assistant

## 2019-03-25 DIAGNOSIS — M542 Cervicalgia: Secondary | ICD-10-CM

## 2019-03-25 DIAGNOSIS — M25511 Pain in right shoulder: Secondary | ICD-10-CM

## 2019-03-25 DIAGNOSIS — G8929 Other chronic pain: Secondary | ICD-10-CM

## 2019-03-25 MED ORDER — HYDROCODONE-ACETAMINOPHEN 5-325 MG PO TABS: 1.0000 | ORAL_TABLET | Freq: Four times a day (QID) | ORAL | 0 refills | Status: DC | PRN

## 2019-03-25 NOTE — Progress Notes (Signed)
Office Visit Note   Patient: Lorraine Page           Date of Birth: 10/12/1932           MRN: [id number] Visit Date: 03/25/2019              Requested by: Brooke Bonito, MD No address on file PCP: Brooke Bonito, MD   Assessment & Plan: Visit Diagnoses:  1. Neck pain on right side   2. Chronic right shoulder pain     Plan:  Right shoulder is prepped with Betadine ethyl chloride and 5 cc lidocaine is used for further anesthetize the skin.  12 cc of blood was aspirated patient tolerated this well.  Post aspiration she had increased range of motion and better tolerance range of motion of the shoulder is actually able to get the arm proximally 160 degrees letter head.  Still with weakness with external rotation of the shoulder.  Follow-Up Instructions: Return in about 2 weeks (around 04/08/2019).   Orders:  Orders Placed This Encounter  Procedures  . XR Cervical Spine 2 or 3 views  . XR Shoulder Right   No orders of the defined types were placed in this encounter.     Procedures: No procedures performed   Clinical Data: No additional findings.   Subjective: Chief Complaint  Patient presents with  . Right Shoulder - Pain    HPI Lorraine Page comes in today due to increased pain in the right shoulder.  Also pain radiating down the arm into her hand.  She notices numbness in her median distribution of the right hand.  No recent fall.  She did have a subacromial injection with Dr. Magnus Ivan on 02/02/2019 gave her no real relief.  States the shoulder pain was worse afterwards.  She has had some neck pain but this is improving since her stroke.  She also notes some swelling over her right shoulder she is unsure how long this has been present.  Her daughter accompanies her today and states that she has had no acute fall. Review of Systems Please see HPI  Objective: Vital Signs: There were no vitals taken for this visit.  Physical Exam General: Patient is in no  acute distress.  Mood and affect appropriate. Psych: Alert and oriented x3 Vascular: Radial pulses are 2+ bilaterally Ortho Exam Right shoulder weakness with external rotation against resistance.  Unable to tolerate forward flexion of the right shoulder beyond 90 degrees.  Positive effusion and slight ecchymosis anterior aspect of the right shoulder.  Subjective decreased sensation median distribution of the right hand. Cervical spine she has good range of motion of the cervical spine without pain.  Negative Spurling's.  Nontender medial border of the right scapula.  Specialty Comments:  No specialty comments available.  Imaging: XR Cervical Spine 2 or 3 views  Result Date: 03/25/2019 Cervical spine 2 views: Shows degenerative changes at C5 5 C6.  No acute fractures.  Unable to visualize C 7/T1 due to underpenetration.  XR Shoulder Right  Result Date: 03/25/2019 Right shoulder 3 views: No acute fracture.  AC joint changes moderate.  High riding humeral head.  Narrowing of the glenohumeral joint.    PMFS History: Patient Active Problem List   Diagnosis Date Noted  . Pulmonary hypertension, moderate to severe (HCC) 02/11/2019  . Permanent atrial fibrillation (HCC) 12/16/2018  . Chronic anticoagulation 12/16/2018  . H/O: CVA (cerebrovascular accident) 12/16/2018  . CRI (chronic renal insufficiency), stage 3 (moderate) 12/16/2018  .  Essential hypertension 12/16/2018  . GERD (gastroesophageal reflux disease) 12/16/2018  . Shortness of breath 12/16/2018  . Status post total replacement of right hip 08/12/2018  . Degenerative joint disease (DJD) of hip 03/19/2018   Past Medical History:  Diagnosis Date  . Atrial fibrillation ([place])   . Chronic kidney disease, stage 3   . CVA (cerebral vascular accident) ([place])   . GERD (gastroesophageal reflux disease)   . Hyperlipidemia   . Hypertension     Family History  Problem Relation Age of Onset  . Stomach cancer Mother   . CAD Father      Past Surgical History:  Procedure Laterality Date  . carpel tunnel Right   . CHOLECYSTECTOMY    . TOTAL HIP ARTHROPLASTY Right 08/12/2018   Procedure: RIGHT TOTAL HIP ARTHROPLASTY ANTERIOR APPROACH;  Surgeon: Mcarthur Rossetti, MD;  Location: [facility];  Service: Orthopedics;  Laterality: Right;   Social History   Occupational History  . Not on file  Tobacco Use  . Smoking status: Former Research scientist (life sciences)  . Smokeless tobacco: Never Used  Substance and Sexual Activity  . Alcohol use: Not Currently  . Drug use: Never  . Sexual activity: Not on file

## 2019-03-26 ENCOUNTER — Other Ambulatory Visit: Payer: Self-pay | Admitting: Physician Assistant

## 2019-03-26 NOTE — Telephone Encounter (Signed)
Please advise 

## 2019-03-27 ENCOUNTER — Other Ambulatory Visit: Payer: Self-pay | Admitting: Physician Assistant

## 2019-03-27 MED ORDER — TRAMADOL HCL 50 MG PO TABS: 50.0000 mg | ORAL_TABLET | Freq: Four times a day (QID) | ORAL | 0 refills | Status: DC | PRN

## 2019-04-06 ENCOUNTER — Other Ambulatory Visit: Payer: Self-pay | Admitting: Cardiology

## 2019-04-06 DIAGNOSIS — I10 Essential (primary) hypertension: Secondary | ICD-10-CM

## 2019-04-08 ENCOUNTER — Encounter: Payer: Self-pay | Admitting: Physician Assistant

## 2019-04-08 ENCOUNTER — Ambulatory Visit: Payer: Medicare HMO | Admitting: Physician Assistant

## 2019-04-08 ENCOUNTER — Ambulatory Visit: Payer: Self-pay

## 2019-04-08 ENCOUNTER — Other Ambulatory Visit: Payer: Self-pay

## 2019-04-08 DIAGNOSIS — M25511 Pain in right shoulder: Secondary | ICD-10-CM | POA: Diagnosis not present

## 2019-04-08 DIAGNOSIS — G8929 Other chronic pain: Secondary | ICD-10-CM

## 2019-04-08 NOTE — Progress Notes (Signed)
Subjective: She is here for ultrasound-guided aspiration of her right shoulder.  A recent palpation guided aspiration produced 12 cc of dark bloody fluid.  She continues to have swelling of her shoulder and pain.  Objective: She has prominent swelling on the anterior lateral aspect of her shoulder.  Limited diagnostic ultrasound: There is significant thickening of the synovium with not a lot of free fluid.  No hyperemia on power Doppler imaging.  Procedure: Ultrasound-guided right shoulder aspiration and injection: After sterile prep with Betadine, injected 5 cc 1% lidocaine without epinephrine, then attempted aspiration but only a scant amount of dark bloody fluid was obtained, then 40 mg methylprednisolone injected.

## 2019-04-09 NOTE — Progress Notes (Signed)
HPI: FU atrial fibrillation.Patient was off of Eliquis for hip injection in January 2019 and suffered an embolic CVA. She has some gait instability from previous CVA. Last echocardiogram November 2020 showed normal LV function, restrictive filling, mild biatrial enlargement, mild right ventricular enlargement with mildly reduced LV function, mild to moderate mitral regurgitation, moderate tricuspid regurgitation and severely elevated pulmonary pressures. Patient seen with increased dyspnea and edema November 2020. She was noted to be bradycardic and Lopressor was decreased.  BNP 2794 and mildly elevated TSH. VQ scan was negative.  Pulmonary function test showed minimal obstructive lung disease.  Monitor December 2020 showed sinus bradycardia with occasional PAC, PVC, junctional rhythm and occasional pauses with longest being 4.7 seconds.  Metoprolol discontinued.  Since last seen, she denies dyspnea, chest pain, palpitations or syncope.  She has noticed increased pedal edema and jitteriness.  Current Outpatient Medications  Medication Sig Dispense Refill  . acetaminophen (TYLENOL 8 HOUR) 650 MG CR tablet Take 1,300 mg by mouth 2 (two) times daily.     Marland Kitchen apixaban (ELIQUIS) 5 MG TABS tablet Take 1 tablet (5 mg total) by mouth 2 (two) times daily. 60 tablet 12  . cimetidine (TAGAMET HB) 200 MG tablet Take 200 mg by mouth daily.     Marland Kitchen escitalopram (LEXAPRO) 20 MG tablet Take 20 mg by mouth daily.    . hydrALAZINE (APRESOLINE) 25 MG tablet Take 1 tablet (25 mg total) by mouth 2 (two) times daily. 60 tablet 3  . losartan (COZAAR) 100 MG tablet TAKE 1 TABLET BY MOUTH EVERY DAY 90 tablet 3  . Multiple Vitamin (MULTIVITAMIN) tablet Take 1 tablet by mouth daily.    . Probiotic Product (PROBIOTIC DAILY PO) Take 1 capsule by mouth daily.     . rosuvastatin (CRESTOR) 10 MG tablet Take 5 mg by mouth 2 (two) times a week.    . traMADol (ULTRAM) 50 MG tablet Take 1 tablet (50 mg total) by mouth every 6  (six) hours as needed. 30 tablet 0  . amLODipine (NORVASC) 10 MG tablet Take 1 tablet (10 mg total) by mouth daily. 30 tablet 3  . furosemide (LASIX) 20 MG tablet Take 2 tablets (40 mg total) by mouth daily. 180 tablet 3  . HYDROcodone-acetaminophen (NORCO) 5-325 MG tablet Take 1 tablet by mouth every 6 (six) hours as needed for moderate pain. (Patient not taking: Reported on 04/16/2019) 15 tablet 0  . trolamine salicylate (ASPERCREME) 10 % cream Apply 1 application topically as needed for muscle pain.     No current facility-administered medications for this visit.     Past Medical History:  Diagnosis Date  . Atrial fibrillation (HCC)   . Chronic kidney disease, stage 3   . CVA (cerebral vascular accident) (HCC)   . GERD (gastroesophageal reflux disease)   . Hyperlipidemia   . Hypertension     Past Surgical History:  Procedure Laterality Date  . carpel tunnel Right   . CHOLECYSTECTOMY    . TOTAL HIP ARTHROPLASTY Right 08/12/2018   Procedure: RIGHT TOTAL HIP ARTHROPLASTY ANTERIOR APPROACH;  Surgeon: Kathryne Hitch, MD;  Location: [facility];  Service: Orthopedics;  Laterality: Right;    Social History   Socioeconomic History  . Marital status: Widowed    Spouse name: Not on file  . Number of children: 3  . Years of education: Not on file  . Highest education level: Not on file  Occupational History  . Not on file  Tobacco Use  .  Smoking status: Former Research scientist (life sciences)  . Smokeless tobacco: Never Used  Substance and Sexual Activity  . Alcohol use: Not Currently  . Drug use: Never  . Sexual activity: Not on file  Other Topics Concern  . Not on file  Social History Narrative  . Not on file   Social Determinants of Health   Financial Resource Strain:   . Difficulty of Paying Living Expenses:   Food Insecurity:   . Worried About Charity fundraiser in the Last Year:   . Arboriculturist in the Last Year:   Transportation Needs:   . Film/video editor (Medical):   Marland Kitchen  Lack of Transportation (Non-Medical):   Physical Activity:   . Days of Exercise per Week:   . Minutes of Exercise per Session:   Stress:   . Feeling of Stress :   Social Connections:   . Frequency of Communication with Friends and Family:   . Frequency of Social Gatherings with Friends and Family:   . Attends Religious Services:   . Active Member of Clubs or Organizations:   . Attends Archivist Meetings:   Marland Kitchen Marital Status:   Intimate Partner Violence:   . Fear of Current or Ex-Partner:   . Emotionally Abused:   Marland Kitchen Physically Abused:   . Sexually Abused:     Family History  Problem Relation Age of Onset  . Stomach cancer Mother   . CAD Father     ROS: Jitteriness but no fevers or chills, productive cough, hemoptysis, dysphasia, odynophagia, melena, hematochezia, dysuria, hematuria, rash, seizure activity, orthopnea, PND, claudication. Remaining systems are negative.  Physical Exam: Well-developed well-nourished in no acute distress.  Skin is warm and dry.  HEENT is normal.  Neck is supple.  Chest is clear to auscultation with normal expansion.  Cardiovascular exam is irregular Abdominal exam nontender or distended. No masses palpated. Extremities show 1+ edema. neuro grossly intact  A/P  1 permanent atrial fibrillation-beta-blocker discontinued due to bradycardia.  Continue apixaban.  Note patient did have previous stroke when off of this medication for a procedure.  She would therefore need Lovenox bridge in the future.  2 pulmonary hypertension-this is felt to be secondary to pulmonary venous hypertension.  Previous VQ scan and pulmonary function tests unrevealing.  We will not pursue further aggressive evaluation given patient's age.  Continue Lasix at present dose.  3 chronic diastolic congestive heart failure-Continue Lasix at present dose.  Check potassium and renal function.  4 hypertension-blood pressure controlled.  However she is complaining of  bilateral lower extremity edema which certainly could be from amlodipine.  I will discontinue this medication.  Increase hydralazine to 50 mg p.o. 3 times daily.  Follow blood pressure and adjust regimen as needed.  If pedal edema does not improve off of amlodipine may need to increase diuretic.  5 hyperlipidemia-continue statin.  Kirk Ruths, MD

## 2019-04-16 ENCOUNTER — Encounter: Payer: Self-pay | Admitting: Cardiology

## 2019-04-16 ENCOUNTER — Ambulatory Visit: Payer: Medicare HMO | Admitting: Cardiology

## 2019-04-16 ENCOUNTER — Other Ambulatory Visit: Payer: Self-pay

## 2019-04-16 VITALS — BP 132/70 | HR 91 | Temp 97.3°F | Ht <= 58 in | Wt 145.2 lb

## 2019-04-16 DIAGNOSIS — I4821 Permanent atrial fibrillation: Secondary | ICD-10-CM | POA: Diagnosis not present

## 2019-04-16 DIAGNOSIS — I10 Essential (primary) hypertension: Secondary | ICD-10-CM

## 2019-04-16 DIAGNOSIS — I272 Pulmonary hypertension, unspecified: Secondary | ICD-10-CM

## 2019-04-16 MED ORDER — HYDRALAZINE HCL 50 MG PO TABS: 50.0000 mg | ORAL_TABLET | Freq: Two times a day (BID) | ORAL | 3 refills | Status: DC

## 2019-04-16 NOTE — Patient Instructions (Signed)
Medication Instructions:  STOP AMLODIPINE  INCREASE HYDRALAZINE TO 50 MG THREE TIMES DAILY= 2 OF THE 25 MG TABLETS THREE TIMES DAILY  *If you need a refill on your cardiac medications before your next appointment, please call your pharmacy*   Lab Work: Your physician recommends that you HAVE LAB WORK TODAY If you have labs (blood work) drawn today and your tests are completely normal, you will receive your results only by: Marland Kitchen MyChart Message (if you have MyChart) OR . A paper copy in the mail If you have any lab test that is abnormal or we need to change your treatment, we will call you to review the results.  Follow-Up: At [facility], you and your health needs are our priority.  As part of our continuing mission to provide you with exceptional heart care, we have created designated Provider Care Teams.  These Care Teams include your primary Cardiologist (physician) and Advanced Practice Providers (APPs -  Physician Assistants and Nurse Practitioners) who all work together to provide you with the care you need, when you need it.  We recommend signing up for the patient portal called "MyChart".  Sign up information is provided on this After Visit Summary.  MyChart is used to connect with patients for Virtual Visits (Telemedicine).  Patients are able to view lab/test results, encounter notes, upcoming appointments, etc.  Non-urgent messages can be sent to your provider as well.   To learn more about what you can do with MyChart, go to [email or web address].    Your next appointment:   6 month(s)  The format for your next appointment:   Either In Person or Virtual  Provider:   You may see Olga Millers, MD or one of the following Advanced Practice Providers on your designated Care Team:    Corine Shelter, PA-C  [place], [place]  Edd Fabian, [place]

## 2019-04-17 LAB — BASIC METABOLIC PANEL
BUN/Creatinine Ratio: 17 (ref 12–28)
BUN: 16 mg/dL (ref 8–27)
CO2: 24 mmol/L (ref 20–29)
Calcium: 9.5 mg/dL (ref 8.7–10.3)
Chloride: 104 mmol/L (ref 96–106)
Creatinine, Ser: 0.93 mg/dL (ref 0.57–1.00)
GFR calc Af Amer: 65 mL/min/{1.73_m2} (ref >59)
GFR calc non Af Amer: 57 mL/min/{1.73_m2} — ABNORMAL LOW (ref >59)
Glucose: 95 mg/dL (ref 65–99)
Potassium: 4.3 mmol/L (ref 3.5–5.2)
Sodium: 145 mmol/L — ABNORMAL HIGH (ref 134–144)

## 2019-04-25 DIAGNOSIS — I10 Essential (primary) hypertension: Secondary | ICD-10-CM

## 2019-04-27 MED ORDER — HYDRALAZINE HCL 50 MG PO TABS: 50.0000 mg | ORAL_TABLET | Freq: Three times a day (TID) | ORAL | 3 refills | Status: DC

## 2019-05-04 ENCOUNTER — Other Ambulatory Visit: Payer: Self-pay | Admitting: Cardiology

## 2019-09-14 ENCOUNTER — Other Ambulatory Visit: Payer: Self-pay | Admitting: Cardiology

## 2019-09-28 ENCOUNTER — Other Ambulatory Visit: Payer: Self-pay | Admitting: Cardiology

## 2019-10-21 ENCOUNTER — Telehealth: Payer: Self-pay

## 2019-10-21 NOTE — Telephone Encounter (Addendum)
**Note De-Identified Lorraine Page Obfuscation** I started a Eliquis tier exception through covermymeds. Key: [id number]  Following message received: If you are requesting Eliquis, Xarelto, Myrbetriq, or Ventolin HFA, there is not a lower available tier to request.  I then called the pts daughter Lorraine Page [facility]) to discuss pt asst for the pts Eliquis through [employer] and she is interested in the pt applying on the pts behalf.  Lorraine Page is aware to either call Lorraine Page [employer] at [phone] to request they mail her an application or to print one off from the [employer]'s web site. Once she receives the application I have advised her to complete the pts part of the application, have the pt sign it, obtain the pts 2020 proof of income, 2021 out of pocket expense report from her pharmacy and to bring all to the office to drop off and that we will take care of Dr Ludwig Clarks part and wuill fax all in to [employer].  Lorraine Page states that the pt is almost out of Eliquis so they are requesting Eliquis 5mg  samples and are aware that we are leaving them in the front office for them to pick up I have advised that if mother runs low on Eliquis while we are working on pt asst to call Lorraine Page and if we have samples we will provide them to her.  [place] thanked me for calling her to discuss.

## 2019-10-26 NOTE — Telephone Encounter (Signed)
Received fax from [facility] and tier exception has been approved.

## 2019-12-07 NOTE — Progress Notes (Signed)
HPI: FU atrial fibrillation.Patient was off of Eliquis for hip injection in January 2019 and suffered an embolic CVA. She has some gait instability from previous CVA. Last echocardiogram November 2020 showed normal LV function, restrictive filling, mild biatrial enlargement, mild right ventricular enlargement with mildly reduced LV function, mild to moderate mitral regurgitation, moderate tricuspid regurgitation and severely elevated pulmonary pressures. Patient seen with increased dyspnea and edema November 2020. She was noted to be bradycardic and Lopressor was decreased. BNP 2794 and mildly elevated TSH. VQ scan was negative. Pulmonary function test showed minimal obstructive lung disease.  Monitor December 2020 showed sinus bradycardia with occasional PAC, PVC, junctional rhythm and occasional pauses with longest being 4.7 seconds.  Metoprolol discontinued.  Since last seen,  there is no increased dyspnea, chest pain, palpitations, syncope or bleeding.  No falls.  Current Outpatient Medications  Medication Sig Dispense Refill  . acetaminophen (TYLENOL 8 HOUR) 650 MG CR tablet Take 1,300 mg by mouth 2 (two) times daily.     Marland Kitchen amLODipine (NORVASC) 10 MG tablet TAKE 1 TABLET BY MOUTH EVERY DAY 30 tablet 3  . cimetidine (TAGAMET HB) 200 MG tablet Take 200 mg by mouth daily.     Marland Kitchen ELIQUIS 5 MG TABS tablet TAKE 1 TABLET BY MOUTH 2 TIMES A DAY 60 tablet 12  . escitalopram (LEXAPRO) 20 MG tablet TAKE 1 TABLET BY MOUTH EVERY EVENING 30 tablet 3  . hydrALAZINE (APRESOLINE) 50 MG tablet Take 1 tablet (50 mg total) by mouth 3 (three) times daily. 270 tablet 3  . losartan (COZAAR) 100 MG tablet TAKE 1 TABLET BY MOUTH EVERY DAY 90 tablet 3  . Multiple Vitamin (MULTIVITAMIN) tablet Take 1 tablet by mouth daily.    . Probiotic Product (PROBIOTIC DAILY PO) Take 1 capsule by mouth daily.     . rosuvastatin (CRESTOR) 10 MG tablet Take 5 mg by mouth 2 (two) times a week.    . furosemide (LASIX) 20 MG  tablet Take 2 tablets (40 mg total) by mouth daily. 180 tablet 3   No current facility-administered medications for this visit.     Past Medical History:  Diagnosis Date  . Atrial fibrillation (HCC)   . Chronic kidney disease, stage 3 (HCC)   . CVA (cerebral vascular accident) (HCC)   . GERD (gastroesophageal reflux disease)   . Hyperlipidemia   . Hypertension     Past Surgical History:  Procedure Laterality Date  . carpel tunnel Right   . CHOLECYSTECTOMY    . TOTAL HIP ARTHROPLASTY Right 08/12/2018   Procedure: RIGHT TOTAL HIP ARTHROPLASTY ANTERIOR APPROACH;  Surgeon: Kathryne Hitch, MD;  Location: [facility];  Service: Orthopedics;  Laterality: Right;    Social History   Socioeconomic History  . Marital status: Widowed    Spouse name: Not on file  . Number of children: 3  . Years of education: Not on file  . Highest education level: Not on file  Occupational History  . Not on file  Tobacco Use  . Smoking status: Former Games developer  . Smokeless tobacco: Never Used  Vaping Use  . Vaping Use: Never used  Substance and Sexual Activity  . Alcohol use: Not Currently  . Drug use: Never  . Sexual activity: Not on file  Other Topics Concern  . Not on file  Social History Narrative  . Not on file   Social Determinants of Health   Financial Resource Strain:   . Difficulty of Paying Living  Expenses: Not on file  Food Insecurity:   . Worried About Programme researcher, broadcasting/film/video in the Last Year: Not on file  . Ran Out of Food in the Last Year: Not on file  Transportation Needs:   . Lack of Transportation (Medical): Not on file  . Lack of Transportation (Non-Medical): Not on file  Physical Activity:   . Days of Exercise per Week: Not on file  . Minutes of Exercise per Session: Not on file  Stress:   . Feeling of Stress : Not on file  Social Connections:   . Frequency of Communication with Friends and Family: Not on file  . Frequency of Social Gatherings with Friends and Family:  Not on file  . Attends Religious Services: Not on file  . Active Member of Clubs or Organizations: Not on file  . Attends Banker Meetings: Not on file  . Marital Status: Not on file  Intimate Partner Violence:   . Fear of Current or Ex-Partner: Not on file  . Emotionally Abused: Not on file  . Physically Abused: Not on file  . Sexually Abused: Not on file    Family History  Problem Relation Age of Onset  . Stomach cancer Mother   . CAD Father     ROS: no fevers or chills, productive cough, hemoptysis, dysphasia, odynophagia, melena, hematochezia, dysuria, hematuria, rash, seizure activity, orthopnea, PND, pedal edema, claudication. Remaining systems are negative.  Physical Exam: Well-developed well-nourished in no acute distress.  Skin is warm and dry.  HEENT is normal.  Neck is supple.  Chest is clear to auscultation with normal expansion.  Cardiovascular exam is regular rate and rhythm.  2/6 systolic murmur left sternal border. Abdominal exam nontender or distended. No masses palpated. Extremities show no edema. neuro grossly intact  ECG-normal sinus rhythm at a rate of 72, no ST changes.  Personally reviewed  A/P  1 paroxysmal atrial fibrillation-note beta-blocker was discontinued previously due to bradycardia.  Continue apixaban.  As outlined in previous notes she had a stroke when off of anticoagulation previously and would therefore need Lovenox bridge in the future if procedure is necessary.  Check hemoglobin and renal function.  2 Hypertension-blood pressure elevated; however she states at home it is typically better.  She will follow this and if elevated we will increase hydralazine if needed.  3 hyperlipidemia-continue statin.  Check lipids and liver.  4 chronic diastolic congestive heart failure-symptoms appear to be controlled on present medications.  Continue Lasix at present dose.  Check potassium and renal function.  5 pulmonary  hypertension-likely secondary to pulmonary venous hypertension.  Note previous VQ scan and pulmonary function test unrevealing.  Given patient's age we have elected to be conservative.  Continue Lasix.  Olga Millers, MD

## 2019-12-14 ENCOUNTER — Other Ambulatory Visit: Payer: Self-pay | Admitting: Cardiology

## 2019-12-14 ENCOUNTER — Other Ambulatory Visit: Payer: Self-pay

## 2019-12-14 ENCOUNTER — Encounter: Payer: Self-pay | Admitting: Cardiology

## 2019-12-14 ENCOUNTER — Ambulatory Visit: Payer: Medicare HMO | Admitting: Cardiology

## 2019-12-14 VITALS — BP 150/74 | HR 72 | Temp 97.7°F | Ht 59.0 in | Wt 150.6 lb

## 2019-12-14 DIAGNOSIS — I10 Essential (primary) hypertension: Secondary | ICD-10-CM | POA: Diagnosis not present

## 2019-12-14 DIAGNOSIS — E7800 Pure hypercholesterolemia, unspecified: Secondary | ICD-10-CM | POA: Diagnosis not present

## 2019-12-14 DIAGNOSIS — I5032 Chronic diastolic (congestive) heart failure: Secondary | ICD-10-CM | POA: Diagnosis not present

## 2019-12-14 DIAGNOSIS — I4821 Permanent atrial fibrillation: Secondary | ICD-10-CM

## 2019-12-14 DIAGNOSIS — R0602 Shortness of breath: Secondary | ICD-10-CM

## 2019-12-14 NOTE — Patient Instructions (Signed)
°  Lab Work: ° °Your physician recommends that you return for lab work FASTING ° °If you have labs (blood work) drawn today and your tests are completely normal, you will receive your results only by: °MyChart Message (if you have MyChart) OR °A paper copy in the mail °If you have any lab test that is abnormal or we need to change your treatment, we will call you to review the results. ° ° °Follow-Up: °At [facility], you and your health needs are our priority.  As part of our continuing mission to provide you with exceptional heart care, we have created designated Provider Care Teams.  These Care Teams include your primary Cardiologist (physician) and Advanced Practice Providers (APPs -  Physician Assistants and Nurse Practitioners) who all work together to provide you with the care you need, when you need it. ° °We recommend signing up for the patient portal called "MyChart".  Sign up information is provided on this After Visit Summary.  MyChart is used to connect with patients for Virtual Visits (Telemedicine).  Patients are able to view lab/test results, encounter notes, upcoming appointments, etc.  Non-urgent messages can be sent to your provider as well.   °To learn more about what you can do with MyChart, go to [email or web address].   ° °Your next appointment:   °6 month(s) ° °The format for your next appointment:   °In Person ° °Provider:   °Brian Crenshaw, MD   ° ° °

## 2020-01-02 ENCOUNTER — Other Ambulatory Visit: Payer: Self-pay | Admitting: Cardiology

## 2020-01-04 NOTE — Telephone Encounter (Signed)
Should come from primary care Lorraine Page  

## 2020-01-25 ENCOUNTER — Other Ambulatory Visit: Payer: Self-pay | Admitting: Cardiology

## 2020-02-01 DIAGNOSIS — I10 Essential (primary) hypertension: Secondary | ICD-10-CM

## 2020-02-03 MED ORDER — HYDRALAZINE HCL 50 MG PO TABS: 75.0000 mg | ORAL_TABLET | Freq: Three times a day (TID) | ORAL | 3 refills | Status: DC

## 2020-02-03 NOTE — Telephone Encounter (Signed)
°  Change hydralazine to 75 mg tid  Olga Millers

## 2020-02-08 ENCOUNTER — Encounter: Payer: Self-pay | Admitting: *Deleted

## 2020-03-12 ENCOUNTER — Other Ambulatory Visit: Payer: Self-pay | Admitting: Cardiology

## 2020-03-31 ENCOUNTER — Other Ambulatory Visit: Payer: Self-pay | Admitting: *Deleted

## 2020-03-31 DIAGNOSIS — N183 Chronic kidney disease, stage 3 unspecified: Secondary | ICD-10-CM

## 2020-03-31 LAB — COMPREHENSIVE METABOLIC PANEL
ALT: 21 IU/L (ref 0–32)
AST: 31 IU/L (ref 0–40)
Albumin/Globulin Ratio: 1.8 (ref 1.2–2.2)
Albumin: 4.6 g/dL (ref 3.6–4.6)
Alkaline Phosphatase: 72 IU/L (ref 44–121)
BUN/Creatinine Ratio: 11 — ABNORMAL LOW (ref 12–28)
BUN: 17 mg/dL (ref 8–27)
Bilirubin Total: 0.4 mg/dL (ref 0.0–1.2)
CO2: 22 mmol/L (ref 20–29)
Calcium: 10.2 mg/dL (ref 8.7–10.3)
Chloride: 103 mmol/L (ref 96–106)
Creatinine, Ser: 1.5 mg/dL — ABNORMAL HIGH (ref 0.57–1.00)
GFR calc Af Amer: 36 mL/min/{1.73_m2} — ABNORMAL LOW (ref >59)
GFR calc non Af Amer: 32 mL/min/{1.73_m2} — ABNORMAL LOW (ref >59)
Globulin, Total: 2.6 g/dL (ref 1.5–4.5)
Glucose: 123 mg/dL — ABNORMAL HIGH (ref 65–99)
Potassium: 4.9 mmol/L (ref 3.5–5.2)
Sodium: 143 mmol/L (ref 134–144)
Total Protein: 7.2 g/dL (ref 6.0–8.5)

## 2020-03-31 LAB — CBC
Hematocrit: 39.5 % (ref 34.0–46.6)
Hemoglobin: 12.9 g/dL (ref 11.1–15.9)
MCH: 31 pg (ref 26.6–33.0)
MCHC: 32.7 g/dL (ref 31.5–35.7)
MCV: 95 fL (ref 79–97)
Platelets: 213 10*3/uL (ref 150–450)
RBC: 4.16 x10E6/uL (ref 3.77–5.28)
RDW: 13.6 % (ref 11.7–15.4)
WBC: 5 10*3/uL (ref 3.4–10.8)

## 2020-03-31 LAB — LIPID PANEL
Chol/HDL Ratio: 4 ratio (ref 0.0–4.4)
Cholesterol, Total: 170 mg/dL (ref 100–199)
HDL: 42 mg/dL (ref >39)
LDL Chol Calc (NIH): 98 mg/dL (ref 0–99)
Triglycerides: 171 mg/dL — ABNORMAL HIGH (ref 0–149)
VLDL Cholesterol Cal: 30 mg/dL (ref 5–40)

## 2020-04-09 ENCOUNTER — Other Ambulatory Visit: Payer: Self-pay | Admitting: Cardiology

## 2020-04-09 DIAGNOSIS — I10 Essential (primary) hypertension: Secondary | ICD-10-CM

## 2020-05-30 ENCOUNTER — Other Ambulatory Visit: Payer: Self-pay | Admitting: Cardiology

## 2020-05-30 NOTE — Progress Notes (Signed)
HPI: FU atrial fibrillation.Patient was off of Eliquis for hip injection in January 2019 and suffered an embolic CVA. She has some gait instability from previous CVA. Last echocardiogram November 2020 showed normal LV function, restrictive filling, mild biatrial enlargement, mild right ventricular enlargement with mildly reduced RV function, mild to moderate mitral regurgitation, moderate tricuspid regurgitation and severely elevated pulmonary pressures. Patient seen with increased dyspnea and edema November 2020. She was noted to be bradycardic and Lopressor was decreased. BNP 2794 and mildly elevated TSH. VQ scan was negative. Pulmonary function test showed minimal obstructive lung disease.Monitor December 2020 showed sinus bradycardia with occasional PAC, PVC, junctional rhythm and occasional pauses with longest being 4.7 seconds. Metoprolol discontinued. Since last seen,patient denies dyspnea, chest pain, palpitations or syncope.  No bleeding.  Current Outpatient Medications  Medication Sig Dispense Refill  . acetaminophen (TYLENOL) 650 MG CR tablet Take 1,300 mg by mouth 2 (two) times daily.     . cimetidine (TAGAMET) 200 MG tablet Take 200 mg by mouth daily.     Marland Kitchen ELIQUIS 5 MG TABS tablet TAKE 1 TABLET BY MOUTH 2 TIMES A DAY 60 tablet 12  . escitalopram (LEXAPRO) 20 MG tablet TAKE 1 TABLET BY MOUTH EVERY EVENING 90 tablet 2  . furosemide (LASIX) 20 MG tablet TAKE 2 TABLETS (40 MG TOTAL) BY MOUTH DAILY 180 tablet 3  . hydrALAZINE (APRESOLINE) 50 MG tablet Take 1.5 tablets (75 mg total) by mouth 3 (three) times daily. 405 tablet 3  . losartan (COZAAR) 100 MG tablet TAKE 1 TABLET BY MOUTH EVERY DAY 90 tablet 3  . Multiple Vitamin (MULTIVITAMIN) tablet Take 1 tablet by mouth daily.    . Probiotic Product (PROBIOTIC DAILY PO) Take 1 capsule by mouth daily.     . rosuvastatin (CRESTOR) 10 MG tablet TAKE 1/2 TABLET BY MOUTH TWICE PER WEEK FOR CHOLESTEROL 12 tablet 24   No current  facility-administered medications for this visit.     Past Medical History:  Diagnosis Date  . Atrial fibrillation (HCC)   . Chronic kidney disease, stage 3 (HCC)   . CVA (cerebral vascular accident) (HCC)   . GERD (gastroesophageal reflux disease)   . Hyperlipidemia   . Hypertension     Past Surgical History:  Procedure Laterality Date  . carpel tunnel Right   . CHOLECYSTECTOMY    . TOTAL HIP ARTHROPLASTY Right 08/12/2018   Procedure: RIGHT TOTAL HIP ARTHROPLASTY ANTERIOR APPROACH;  Surgeon: Kathryne Hitch, MD;  Location: [facility] OR;  Service: Orthopedics;  Laterality: Right;    Social History   Socioeconomic History  . Marital status: Widowed    Spouse name: Not on file  . Number of children: 3  . Years of education: Not on file  . Highest education level: Not on file  Occupational History  . Not on file  Tobacco Use  . Smoking status: Former Games developer  . Smokeless tobacco: Never Used  Vaping Use  . Vaping Use: Never used  Substance and Sexual Activity  . Alcohol use: Not Currently  . Drug use: Never  . Sexual activity: Not on file  Other Topics Concern  . Not on file  Social History Narrative  . Not on file   Social Determinants of Health   Financial Resource Strain: Not on file  Food Insecurity: Not on file  Transportation Needs: Not on file  Physical Activity: Not on file  Stress: Not on file  Social Connections: Not on file  Intimate Partner  Violence: Not on file    Family History  Problem Relation Age of Onset  . Stomach cancer Mother   . CAD Father     ROS: no fevers or chills, productive cough, hemoptysis, dysphasia, odynophagia, melena, hematochezia, dysuria, hematuria, rash, seizure activity, orthopnea, PND, pedal edema, claudication. Remaining systems are negative.  Physical Exam: Well-developed well-nourished in no acute distress.  Skin is warm and dry.  HEENT is normal.  Neck is supple.  Chest is clear to auscultation with normal  expansion.  Cardiovascular exam is irregular Abdominal exam nontender or distended. No masses palpated. Extremities show no edema. neuro grossly intact   A/P  1 Atrial fibrillation- continue apixaban; beta blocker DCed previously due to bradycardia.  Note her most recent creatinine was 1.5.  We will recheck potassium and renal function.  If creatinine remains 1.5 or greater will need to decrease dose of apixaban to 2.5 mg twice daily.  2 Hypertension-BP elevated; increase hydralazine to 100 mg p.o. 3 times daily and follow.  3 Hyperlipidemia-continue statin.  4 chronic diastolic CHF-euvolemic on examination.  Continue diuretics at present dose.  Check potassium and renal function.  5 pulmonary hypertension-this is felt likely secondary to pulmonary venous hypertension.  Previous VQ scan and pulmonary function tests unrevealing.  Given patient's age we are being conservative.  Continue diuretics at present dose.  Olga Millers, MD

## 2020-06-08 ENCOUNTER — Ambulatory Visit: Payer: Medicare HMO | Admitting: Cardiology

## 2020-06-08 ENCOUNTER — Other Ambulatory Visit: Payer: Self-pay

## 2020-06-08 ENCOUNTER — Encounter: Payer: Self-pay | Admitting: Cardiology

## 2020-06-08 VITALS — BP 158/76 | HR 76 | Ht <= 58 in | Wt 146.0 lb

## 2020-06-08 DIAGNOSIS — I10 Essential (primary) hypertension: Secondary | ICD-10-CM

## 2020-06-08 DIAGNOSIS — I5032 Chronic diastolic (congestive) heart failure: Secondary | ICD-10-CM | POA: Diagnosis not present

## 2020-06-08 DIAGNOSIS — E7800 Pure hypercholesterolemia, unspecified: Secondary | ICD-10-CM

## 2020-06-08 DIAGNOSIS — I4821 Permanent atrial fibrillation: Secondary | ICD-10-CM

## 2020-06-08 MED ORDER — HYDRALAZINE HCL 100 MG PO TABS: 100.0000 mg | ORAL_TABLET | Freq: Three times a day (TID) | ORAL | 3 refills | Status: DC

## 2020-06-08 NOTE — Patient Instructions (Signed)
Medication Instructions:   INCREASE HYDRALAZINE TO 100 MG THREE TIMES DAILY= 2 OF THE 50 MG TABLETS THREE TIMES DAILY  *If you need a refill on your cardiac medications before your next appointment, please call your pharmacy*  Follow-Up: At [facility], you and your health needs are our priority.  As part of our continuing mission to provide you with exceptional heart care, we have created designated Provider Care Teams.  These Care Teams include your primary Cardiologist (physician) and Advanced Practice Providers (APPs -  Physician Assistants and Nurse Practitioners) who all work together to provide you with the care you need, when you need it.  We recommend signing up for the patient portal called "MyChart".  Sign up information is provided on this After Visit Summary.  MyChart is used to connect with patients for Virtual Visits (Telemedicine).  Patients are able to view lab/test results, encounter notes, upcoming appointments, etc.  Non-urgent messages can be sent to your provider as well.   To learn more about what you can do with MyChart, go to [email or web address].    Your next appointment:   6 month(s)  The format for your next appointment:   In Person  Provider:   Olga Millers, MD

## 2020-06-09 LAB — BASIC METABOLIC PANEL
BUN/Creatinine Ratio: 16 (ref 12–28)
BUN: 22 mg/dL (ref 8–27)
CO2: 22 mmol/L (ref 20–29)
Calcium: 9.3 mg/dL (ref 8.7–10.3)
Chloride: 102 mmol/L (ref 96–106)
Creatinine, Ser: 1.34 mg/dL — ABNORMAL HIGH (ref 0.57–1.00)
Glucose: 103 mg/dL — ABNORMAL HIGH (ref 65–99)
Potassium: 4.6 mmol/L (ref 3.5–5.2)
Sodium: 140 mmol/L (ref 134–144)
eGFR: 39 mL/min/{1.73_m2} — ABNORMAL LOW (ref >59)

## 2020-06-13 ENCOUNTER — Ambulatory Visit: Payer: Self-pay

## 2020-06-13 ENCOUNTER — Ambulatory Visit: Payer: Medicare HMO | Admitting: Orthopedic Surgery

## 2020-06-13 DIAGNOSIS — G8929 Other chronic pain: Secondary | ICD-10-CM

## 2020-06-13 DIAGNOSIS — M19011 Primary osteoarthritis, right shoulder: Secondary | ICD-10-CM

## 2020-06-15 ENCOUNTER — Encounter: Payer: Self-pay | Admitting: Orthopedic Surgery

## 2020-06-15 NOTE — Progress Notes (Signed)
Office Visit Note   Patient: Lorraine Page           Date of Birth: 03/16/1932           MRN: [id number] Visit Date: 06/13/2020 Requested by: Brooke Bonito, MD [address] [place],  [place] [address] PCP: Harold Barban, MD  Subjective: Chief Complaint  Patient presents with  . Right Shoulder - Pain    HPI: Lorraine Page is a 87 y.o. female who presents to the office complaining of right shoulder pain.  Patient has history of glenohumeral osteoarthritis with rotator cuff arthropathy of the right shoulder that was diagnosed on February 2021 radiographs.  She has had chronic pain from the shoulder with 2 prior injections.  First 1 provided no relief but the second 1 with ultrasound guidance provided lasting relief.  Last injection was in March 2021.  She does complain of right shoulder weakness.  Localizes most of her pain to the mid humerus.  She endorses occasional radiation into her hand and forearm but denies any neck or shoulder blade pain.  She has occasional numbness and tingling in her fingers.  Denies any history of neck or shoulder surgery.  She does endorse history of stroke that affected her right side..                ROS: All systems reviewed are negative as they relate to the chief complaint within the history of present illness.  Patient denies fevers or chills.  Assessment & Plan: Visit Diagnoses:  1. Glenohumeral arthritis, right   2. Chronic right shoulder pain     Plan: Patient is a 87 year old female who presents complaint of right shoulder pain.  She has history of right shoulder glenohumeral osteoarthritis as well as rotator cuff arthropathy.  Last injection with ultrasound guidance provided excellent relief of 9012 months.  She is here today requesting a repeat injection with ultrasound guidance.  Injection without ultrasound guidance did not provide any lasting relief.  She does have weakness of supraspinatus and infraspinatus but good strength of  subscap.  With ultrasound guidance, cortisone injection was successfully delivered into the glenohumeral joint of the right shoulder.  Patient tolerated the procedure well.  Plan for her to follow-up as needed.  Follow-Up Instructions: No follow-ups on file.   Orders:  Orders Placed This Encounter  Procedures  . US Guided Needle Placement - No Linked Charges   No orders of the defined types were placed in this encounter.     Procedures: Large Joint Inj: R glenohumeral on 06/18/2020 12:05 PM Indications: diagnostic evaluation and pain Details: 18 G 1.5 in needle, ultrasound-guided posterior approach  Arthrogram: No  Medications: 9 mL bupivacaine 0.5 %; 5 mL lidocaine 1 %; 6 mg betamethasone acetate-betamethasone sodium phosphate 6 (3-3) MG/ML Outcome: tolerated well, no immediate complications Procedure, treatment alternatives, risks and benefits explained, specific risks discussed. Consent was given by the patient. Immediately prior to procedure a time out was called to verify the correct patient, procedure, equipment, support staff and site/side marked as required. Patient was prepped and draped in the usual sterile fashion.       Clinical Data: No additional findings.  Objective: Vital Signs: There were no vitals taken for this visit.  Physical Exam:  Constitutional: Patient appears well-developed HEENT:  Head: Normocephalic Eyes:EOM are normal Neck: Normal range of motion Cardiovascular: Normal rate Pulmonary/chest: Effort normal Neurologic: Patient is alert Skin: Skin is warm Psychiatric: Patient has normal mood  and affect  Ortho Exam: Ortho exam demonstrates right shoulder with 40 degrees external rotation, 85 degrees abduction, 120 degrees forward flexion.  She has weakness of supraspinatus and infraspinatus with excellent strength of subscapularis.  No pain with cervical spine range of motion or any tenderness throughout the axial cervical spine pain.  She does  have some mild tenderness to palpation over the [facility] joint of the right shoulder as well as the bicipital groove.  Specialty Comments:  No specialty comments available.  Imaging: No results found.   PMFS History: Patient Active Problem List   Diagnosis Date Noted  . Pulmonary hypertension, moderate to severe (HCC) 02/11/2019  . Permanent atrial fibrillation (HCC) 12/16/2018  . Chronic anticoagulation 12/16/2018  . H/O: CVA (cerebrovascular accident) 12/16/2018  . CRI (chronic renal insufficiency), stage 3 (moderate) (HCC) 12/16/2018  . Essential hypertension 12/16/2018  . GERD (gastroesophageal reflux disease) 12/16/2018  . Shortness of breath 12/16/2018  . Status post total replacement of right hip 08/12/2018  . Degenerative joint disease (DJD) of hip 03/19/2018   Past Medical History:  Diagnosis Date  . Atrial fibrillation (HCC)   . Chronic kidney disease, stage 3 (HCC)   . CVA (cerebral vascular accident) (HCC)   . GERD (gastroesophageal reflux disease)   . Hyperlipidemia   . Hypertension     Family History  Problem Relation Age of Onset  . Stomach cancer Mother   . CAD Father     Past Surgical History:  Procedure Laterality Date  . carpel tunnel Right   . CHOLECYSTECTOMY    . TOTAL HIP ARTHROPLASTY Right 08/12/2018   Procedure: RIGHT TOTAL HIP ARTHROPLASTY ANTERIOR APPROACH;  Surgeon: Kathryne Hitch, MD;  Location: [facility] OR;  Service: Orthopedics;  Laterality: Right;   Social History   Occupational History  . Not on file  Tobacco Use  . Smoking status: Former Games developer  . Smokeless tobacco: Never Used  Vaping Use  . Vaping Use: Never used  Substance and Sexual Activity  . Alcohol use: Not Currently  . Drug use: Never  . Sexual activity: Not on file

## 2020-06-18 ENCOUNTER — Encounter: Payer: Self-pay | Admitting: Orthopedic Surgery

## 2020-06-18 DIAGNOSIS — M19011 Primary osteoarthritis, right shoulder: Secondary | ICD-10-CM | POA: Diagnosis not present

## 2020-06-18 MED ORDER — BUPIVACAINE HCL 0.5 % IJ SOLN
9.0000 mL | INTRAMUSCULAR | Status: AC | PRN
  Administered 2020-06-18: 9 mL via INTRA_ARTICULAR

## 2020-06-18 MED ORDER — BETAMETHASONE SOD PHOS & ACET 6 (3-3) MG/ML IJ SUSP
6.0000 mg | INTRAMUSCULAR | Status: AC | PRN
  Administered 2020-06-18: 6 mg via INTRA_ARTICULAR

## 2020-06-18 MED ORDER — LIDOCAINE HCL 1 % IJ SOLN
5.0000 mL | INTRAMUSCULAR | Status: AC | PRN
  Administered 2020-06-18: 5 mL

## 2020-08-24 ENCOUNTER — Emergency Department (HOSPITAL_BASED_OUTPATIENT_CLINIC_OR_DEPARTMENT_OTHER): Payer: Medicare HMO

## 2020-08-24 ENCOUNTER — Emergency Department (HOSPITAL_BASED_OUTPATIENT_CLINIC_OR_DEPARTMENT_OTHER)
Admission: EM | Admit: 2020-08-24 | Discharge: 2020-08-24 | Disposition: A | Discharge status: Home / Self Care | Payer: Medicare HMO | Attending: Emergency Medicine | Admitting: Emergency Medicine

## 2020-08-24 ENCOUNTER — Encounter (HOSPITAL_BASED_OUTPATIENT_CLINIC_OR_DEPARTMENT_OTHER): Payer: Self-pay

## 2020-08-24 ENCOUNTER — Other Ambulatory Visit: Payer: Self-pay

## 2020-08-24 DIAGNOSIS — Z79899 Other long term (current) drug therapy: Secondary | ICD-10-CM | POA: Diagnosis not present

## 2020-08-24 DIAGNOSIS — I129 Hypertensive chronic kidney disease with stage 1 through stage 4 chronic kidney disease, or unspecified chronic kidney disease: Secondary | ICD-10-CM | POA: Diagnosis not present

## 2020-08-24 DIAGNOSIS — Z7901 Long term (current) use of anticoagulants: Secondary | ICD-10-CM | POA: Diagnosis not present

## 2020-08-24 DIAGNOSIS — Z87891 Personal history of nicotine dependence: Secondary | ICD-10-CM | POA: Diagnosis not present

## 2020-08-24 DIAGNOSIS — Z23 Encounter for immunization: Secondary | ICD-10-CM | POA: Diagnosis not present

## 2020-08-24 DIAGNOSIS — Z96641 Presence of right artificial hip joint: Secondary | ICD-10-CM | POA: Diagnosis not present

## 2020-08-24 DIAGNOSIS — W01198A Fall on same level from slipping, tripping and stumbling with subsequent striking against other object, initial encounter: Secondary | ICD-10-CM | POA: Insufficient documentation

## 2020-08-24 DIAGNOSIS — S0083XA Contusion of other part of head, initial encounter: Secondary | ICD-10-CM | POA: Diagnosis not present

## 2020-08-24 DIAGNOSIS — Y92009 Unspecified place in unspecified non-institutional (private) residence as the place of occurrence of the external cause: Secondary | ICD-10-CM | POA: Insufficient documentation

## 2020-08-24 DIAGNOSIS — S0181XA Laceration without foreign body of other part of head, initial encounter: Secondary | ICD-10-CM

## 2020-08-24 DIAGNOSIS — N183 Chronic kidney disease, stage 3 unspecified: Secondary | ICD-10-CM | POA: Diagnosis not present

## 2020-08-24 DIAGNOSIS — S0990XA Unspecified injury of head, initial encounter: Secondary | ICD-10-CM | POA: Diagnosis present

## 2020-08-24 DIAGNOSIS — S00511A Abrasion of lip, initial encounter: Secondary | ICD-10-CM | POA: Diagnosis not present

## 2020-08-24 DIAGNOSIS — W19XXXA Unspecified fall, initial encounter: Secondary | ICD-10-CM

## 2020-08-24 MED ORDER — LIDOCAINE-EPINEPHRINE-TETRACAINE (LET) TOPICAL GEL
3.0000 mL | Freq: Once | TOPICAL | Status: AC
  Administered 2020-08-24: 3 mL via TOPICAL
  Filled 2020-08-24: qty 3

## 2020-08-24 MED ORDER — TETANUS-DIPHTH-ACELL PERTUSSIS 5-2.5-18.5 LF-MCG/0.5 IM SUSY
0.5000 mL | PREFILLED_SYRINGE | Freq: Once | INTRAMUSCULAR | Status: AC
  Administered 2020-08-24: 0.5 mL via INTRAMUSCULAR
  Filled 2020-08-24: qty 0.5

## 2020-08-24 NOTE — ED Provider Notes (Signed)
[facility] EMERGENCY DEPARTMENT Provider Note   CSN: [id number] Arrival date & time: 08/24/20  1929     History Chief Complaint  Patient presents with   Lorraine Page is a 87 y.o. female.  The history is provided by the patient.  Fall Lorraine Page is a 87 y.o. female who presents to the Emergency Department complaining of fall. She presents the emergency department accompanied by her daughter for evaluation of injuries following a fall that occurred around 430 this afternoon. She lives at home alone and she was watering her plants when she went inside she lost her balance and fell, striking her head. She did not lose consciousness. She was able to get herself back up. She does take eloquence for history of a fib. She denies any recent illnesses. She complains of isolated pain to the forehead. No additional symptoms.     Past Medical History:  Diagnosis Date   Atrial fibrillation (HCC)    Chronic kidney disease, stage 3 (HCC)    CVA (cerebral vascular accident) (HCC)    GERD (gastroesophageal reflux disease)    Hyperlipidemia    Hypertension     Patient Active Problem List   Diagnosis Date Noted   Pulmonary hypertension, moderate to severe (HCC) 02/11/2019   Permanent atrial fibrillation (HCC) 12/16/2018   Chronic anticoagulation 12/16/2018   H/O: CVA (cerebrovascular accident) 12/16/2018   CRI (chronic renal insufficiency), stage 3 (moderate) (HCC) 12/16/2018   Essential hypertension 12/16/2018   GERD (gastroesophageal reflux disease) 12/16/2018   Shortness of breath 12/16/2018   Status post total replacement of right hip 08/12/2018   Degenerative joint disease (DJD) of hip 03/19/2018    Past Surgical History:  Procedure Laterality Date   carpel tunnel Right    CHOLECYSTECTOMY     TOTAL HIP ARTHROPLASTY Right 08/12/2018   Procedure: RIGHT TOTAL HIP ARTHROPLASTY ANTERIOR APPROACH;  Surgeon: Kathryne Hitch, MD;  Location: [facility] OR;   Service: Orthopedics;  Laterality: Right;     OB History   No obstetric history on file.     Family History  Problem Relation Age of Onset   Stomach cancer Mother    CAD Father     Social History   Tobacco Use   Smoking status: Former   Smokeless tobacco: Never  Building services engineer Use: Never used  Substance Use Topics   Alcohol use: Not Currently   Drug use: Never    Home Medications Prior to Admission medications   Medication Sig Start Date End Date Taking? Authorizing Provider  acetaminophen (TYLENOL) 650 MG CR tablet Take 1,300 mg by mouth 2 (two) times daily.     [provider]  cimetidine (TAGAMET) 200 MG tablet Take 200 mg by mouth daily.     [provider]  ELIQUIS 5 MG TABS tablet TAKE 1 TABLET BY MOUTH 2 TIMES A DAY 09/14/19   Lewayne Bunting, MD  escitalopram (LEXAPRO) 20 MG tablet TAKE 1 TABLET BY MOUTH EVERY EVENING 05/30/20   Lewayne Bunting, MD  furosemide (LASIX) 20 MG tablet TAKE 2 TABLETS (40 MG TOTAL) BY MOUTH DAILY 12/14/19 03/13/20  Lewayne Bunting, MD  hydrALAZINE (APRESOLINE) 100 MG tablet Take 1 tablet (100 mg total) by mouth 3 (three) times daily. 06/08/20 06/03/21  Lewayne Bunting, MD  losartan (COZAAR) 100 MG tablet TAKE 1 TABLET BY MOUTH EVERY DAY 04/11/20   Lewayne Bunting, MD  Multiple Vitamin (MULTIVITAMIN) tablet Take  1 tablet by mouth daily.    [provider]  Probiotic Product (PROBIOTIC DAILY PO) Take 1 capsule by mouth daily.     [provider]  rosuvastatin (CRESTOR) 10 MG tablet TAKE 1/2 TABLET BY MOUTH TWICE PER WEEK FOR CHOLESTEROL 03/14/20   Lewayne Bunting, MD    Allergies    Penicillins, Sulfa antibiotics, and Beta adrenergic blockers  Review of Systems   Review of Systems  All other systems reviewed and are negative.  Physical Exam Updated Vital Signs BP (!) 166/46   Pulse 78   Temp 98.3 F (36.8 C) (Oral)   Resp 18   Ht 4\' 10"  (1.473 m)   Wt 64.4 kg   SpO2 99%   BMI 29.68  kg/m   Physical Exam Vitals and nursing note reviewed.  Constitutional:      Appearance: She is well-developed.  HENT:     Head: Normocephalic.     Comments: There is a laceration to the central forehead.  There is an abrasion to the right lower lip.  PERRL Cardiovascular:     Rate and Rhythm: Normal rate and regular rhythm.  Pulmonary:     Effort: Pulmonary effort is normal. No respiratory distress.  Abdominal:     Palpations: Abdomen is soft.     Tenderness: There is no abdominal tenderness. There is no guarding or rebound.  Musculoskeletal:        General: No tenderness.  Skin:    General: Skin is warm and dry.  Neurological:     Mental Status: She is alert and oriented to person, place, and time.     Comments: 5/5 strength in all four extremities with sensation to light touch intact in all four extremities.    Psychiatric:        Behavior: Behavior normal.    ED Results / Procedures / Treatments   Labs (all labs ordered are listed, but only abnormal results are displayed) Labs Reviewed - No data to display  EKG None  Radiology CT Head Wo Contrast  Result Date: 08/24/2020 CLINICAL DATA:  87 year old post fall watering plants. EXAM: CT HEAD WITHOUT CONTRAST TECHNIQUE: Contiguous axial images were obtained from the base of the skull through the vertex without intravenous contrast. COMPARISON:  Head CT 02/06/2017 FINDINGS: Brain: No hemorrhage. Expected evolution of left occipital infarct from prior exam with moderate area of encephalomalacia. Periventricular chronic small vessel ischemia seen. There is no hydrocephalus, midline shift or mass effect. No subdural or extra-axial collection. Senescent basal gangliar calcifications. Vascular: Atherosclerosis of skullbase vasculature without hyperdense vessel or abnormal calcification. Skull: No fracture or focal lesion. Sinuses/Orbits: Minor mucosal thickening of ethmoid air cells. Mucous retention cysts in both maxillary sinuses.  There is a right nasal bone fracture that was not seen on prior. Mastoid air cells are clear. Other: Scalp hematoma and laceration just to the right of midline in the low frontal region. IMPRESSION: 1. Scalp hematoma and laceration just to the right of midline in the low frontal region. No acute intracranial abnormality. No skull fracture. 2. Remote left occipital infarct. Chronic small vessel ischemia. 3. Right nasal bone fracture, not seen on prior exam, age indeterminate. Electronically Signed   By: 04/06/2017 M.D.   On: 08/24/2020 21:12   CT Cervical Spine Wo Contrast  Result Date: 08/24/2020 CLINICAL DATA:  87 year old post fall watering plants. EXAM: CT CERVICAL SPINE WITHOUT CONTRAST TECHNIQUE: Multidetector CT imaging of the cervical spine was performed without intravenous contrast. Multiplanar CT  image reconstructions were also generated. COMPARISON:  Radiograph 03/25/2019 FINDINGS: Alignment: Mild degenerative listhesis without traumatic subluxation. Trace retrolisthesis of C3 on C4 and anterolisthesis of C4 on C5. Trace anterolisthesis of C6 on C7 and C7 on T1. Skull base and vertebrae: No acute fracture. Vertebral body heights are maintained. The dens and skull base are intact. Soft tissues and spinal canal: No prevertebral fluid or swelling. No visible canal hematoma. Disc levels: Posterior disc osteophyte complex at C3-C4 causes mild narrowing of the spinal canal. Degenerative disc disease at C5-C6. There is multilevel facet hypertrophy. Upper chest: Nonacute. Other: None. IMPRESSION: Multilevel degenerative change in the cervical spine without acute fracture or subluxation. Electronically Signed   By: Narda Rutherford M.D.   On: 08/24/2020 21:09    Procedures .Marland KitchenLaceration Repair  Date/Time: 08/24/2020 9:41 PM Performed by: Tilden Fossa, MD Authorized by: Tilden Fossa, MD   Consent:    Consent obtained:  Verbal   Consent given by:  Patient Anesthesia:    Anesthesia method:   Topical application   Topical anesthetic:  LET Laceration details:    Location:  Face   Face location:  Forehead   Length (cm):  2 Exploration:    Hemostasis achieved with:  Direct pressure Treatment:    Area cleansed with:  Povidone-iodine   Amount of cleaning:  Standard   Irrigation solution:  Sterile water   Debridement:  None Skin repair:    Repair method:  Sutures   Suture size:  4-0   Wound skin closure material used: vicryl rapide.   Suture technique:  Simple interrupted   Number of sutures:  2 Approximation:    Approximation:  Close   Medications Ordered in ED Medications  lidocaine-EPINEPHrine-tetracaine (LET) topical gel (3 mLs Topical Given 08/24/20 2014)  Tdap (BOOSTRIX) injection 0.5 mL (0.5 mLs Intramuscular Given 08/24/20 2141)    ED Course  I have reviewed the triage vital signs and the nursing notes.  Pertinent labs & imaging results that were available during my care of the patient were reviewed by me and considered in my medical decision making (see chart for details).    MDM Rules/Calculators/A&P                          patient on eliquis here for evaluation of injuries following a fall with head injury. CT scan is negative for acute intracranial abnormality. Wound repaired per procedure note. Discussed with patient and daughter home care for lacerations following fall. Discussed outpatient follow-up and return precautions.  Final Clinical Impression(s) / ED Diagnoses Final diagnoses:  Facial laceration, initial encounter  Fall, initial encounter    Rx / DC Orders ED Discharge Orders     None        Tilden Fossa, MD 08/24/20 2143

## 2020-08-24 NOTE — Discharge Instructions (Addendum)
Your sutures should fall out in the next 5-7 days.  If they do not fall out on their own please see your family doctor to have them removed.

## 2020-08-24 NOTE — ED Notes (Signed)
Patient transported to CT 

## 2020-08-24 NOTE — ED Triage Notes (Addendum)
Pt and daughter report pt fell while watering plants ~430pm-lac to forehead, lac to bottom lip-denies LOC-NAD-to triage in w/c

## 2020-09-02 ENCOUNTER — Encounter: Payer: Self-pay | Admitting: Family

## 2020-09-05 ENCOUNTER — Ambulatory Visit (INDEPENDENT_AMBULATORY_CARE_PROVIDER_SITE_OTHER): Payer: Medicare HMO | Admitting: Family

## 2020-09-05 ENCOUNTER — Other Ambulatory Visit: Payer: Self-pay

## 2020-09-05 VITALS — BP 146/70 | HR 79 | Temp 97.8°F | Resp 16 | Ht <= 58 in | Wt 143.0 lb

## 2020-09-05 DIAGNOSIS — I10 Essential (primary) hypertension: Secondary | ICD-10-CM

## 2020-09-05 DIAGNOSIS — R2681 Unsteadiness on feet: Secondary | ICD-10-CM

## 2020-09-05 DIAGNOSIS — K219 Gastro-esophageal reflux disease without esophagitis: Secondary | ICD-10-CM

## 2020-09-05 DIAGNOSIS — Z780 Asymptomatic menopausal state: Secondary | ICD-10-CM

## 2020-09-05 DIAGNOSIS — I4821 Permanent atrial fibrillation: Secondary | ICD-10-CM | POA: Diagnosis not present

## 2020-09-05 DIAGNOSIS — K582 Mixed irritable bowel syndrome: Secondary | ICD-10-CM

## 2020-09-05 DIAGNOSIS — F5101 Primary insomnia: Secondary | ICD-10-CM

## 2020-09-05 DIAGNOSIS — R7303 Prediabetes: Secondary | ICD-10-CM

## 2020-09-05 DIAGNOSIS — L602 Onychogryphosis: Secondary | ICD-10-CM

## 2020-09-05 DIAGNOSIS — Z0000 Encounter for general adult medical examination without abnormal findings: Secondary | ICD-10-CM

## 2020-09-05 DIAGNOSIS — Z6829 Body mass index (BMI) 29.0-29.9, adult: Secondary | ICD-10-CM

## 2020-09-05 DIAGNOSIS — Z8673 Personal history of transient ischemic attack (TIA), and cerebral infarction without residual deficits: Secondary | ICD-10-CM

## 2020-09-05 DIAGNOSIS — M19011 Primary osteoarthritis, right shoulder: Secondary | ICD-10-CM

## 2020-09-05 DIAGNOSIS — E663 Overweight: Secondary | ICD-10-CM

## 2020-09-05 MED ORDER — GABAPENTIN 100 MG PO CAPS: 100.0000 mg | ORAL_CAPSULE | Freq: Three times a day (TID) | ORAL | 3 refills | Status: DC

## 2020-09-05 MED ORDER — MELATONIN 3 MG PO TABS
3.0000 mg | ORAL_TABLET | Freq: Every day | ORAL | 1 refills | Status: DC
Start: 2020-09-05 — End: 2021-03-27

## 2020-09-05 NOTE — Patient Instructions (Signed)
-   Please get lab work done at [facility] in [place]  - Referral place to Home health agency will call you for appointment

## 2020-09-05 NOTE — Progress Notes (Signed)
Provider: Marlowe Sax FNP-C   Kiaya Haliburton, Nelda Bucks, NP  Patient Care Team: Uchechi Denison, Nelda Bucks, NP as PCP - General (Family Medicine) Lelon Perla, MD as PCP - Cardiology (Cardiology) Mcarthur Rossetti, MD as Consulting Physician (Orthopedic Surgery) Roel Cluck, MD as Referring Physician (Ophthalmology) Stanford Breed Denice Bors, MD as Consulting Physician (Cardiology)  Extended Emergency Contact Information Primary Emergency Contact: Pherigo,Catherine Mobile Phone: [phone] Relation: Daughter Interpreter needed? No Secondary Emergency Contact: Wellington Mobile Phone: [phone] Relation: Son Interpreter needed? No  Code Status:  Full Code  Goals of care: Advanced Directive information Advanced Directives 09/02/2020  Does Patient Have a Medical Advance Directive? No  Type of Advance Directive -  Does patient want to make changes to medical advance directive? -  Copy of [place] in Chart? -  Would patient like information on creating a medical advance directive? No - Patient declined     Chief Complaint  Patient presents with   Establish Care    New Patient.    Concern    High Fall Risk.    HPI:  Pt is a 87 y.o. female seen today to establish care at [facility] for medical management of chronic diseases. She is here with her daughter Barnetta Chapel.Has medical history of Hypertension,Pulmonary Hypertension, Hyperlipidemia,Atrial Fibrillation,Hx of stroke January,2019 ,Osteoarthritis,IBS mixed constipation and diarrhea,CKD stage 3,GERD,Prediabetes   She is status post ED visit 08/24/2020 for fall.states was watering her flower when she tried to pickup her solar light but lost balance and fell.she sustained laceration on her central  forehead and abrasion on the right lip.Central forehead laceration was repaired with vicryl rapide absorbable suture.daughter states has a family member who is a Librarian, academic who removed  one suture but one small piece remained from the second one.laceration site has healed well.   Daughter states has had three falls episode including recent one.fell asleep on the recliner and fell off. She denies any dizziness or lightheadedness.  States CT scan showed old nasal fracture but unclear when she had fracture due to previous falls. Fell into bath tub.Request assistance to replace current shower with bath tub to a walk in shower.    She complains of right shoulder and hand pain.states had a ultrasound sound guided cortisol injection to shoulder since then she has had pain in her hand and unable to use hand to crochet  as she used to anymore.Daughter states patient had pain in the entire hand prior to injection.Has had some numbness/Tingling sensation on the hand though has hx of CVA which affected right hand. She request stronger analgesic since tylenol not helping with the pain. Has taken tramadol in the past but was D/c due to increase confusion.Has been on Gabapentin too not clear if it was discontinued after she had a stroke and got confused or it was due to gabapentin.she is willing to try gabapentin for pain.  Follows up with Orthopedic Dr.Dean Belenda Cruise scott  Insomnia - states not sleeping well at night.request medication to help sleep.  Hypertension - No home blood pressure readings for review.b/p on records was high but down this visit.denies any headache,dizziness,vision changes,fatigue,chest tightness,palpitation,chest pain or shortness of breath.     Afib - she denies any palpitation on Eliquis 5 mg tablet twice daily.reports no signs of bleeding.Follows up with Cardiologist Dr.Brian Crenshaw at [facility] in [facility]last seen 06/08/2020.    Pulmonary Hypertension - on chart review thought due to pulmonary venous hypertension.VQ scan and PFTs were  unrevealing.Cardiologist recommend conservative treatment due to advance age.on diuretic.   GERD - symptoms controlled on  Cimetidine.   Depression - daughter states Lexapro was started after she had her stroke.Has not had any worsening depression symptoms.   Hyperlipidemia - on rosuvastatin.reports no muscle aches or weakness.   Chronic diastolic CHF - on diuretic.she denies any worsening leg edema,abrupt weight gain,fatigue or shortness of breath.    Past Medical History:  Diagnosis Date   Atrial fibrillation ([place])    Chronic kidney disease, stage 3 (HCC)    CVA (cerebral vascular accident) ([place])    GERD (gastroesophageal reflux disease)    GERD (gastroesophageal reflux disease)    Hyperlipidemia    Hypertension    Osteoarthritis    Prediabetes    Past Surgical History:  Procedure Laterality Date   carpel tunnel Right    CHOLECYSTECTOMY     RHINOPLASTY     TOTAL HIP ARTHROPLASTY Right 08/12/2018   Procedure: RIGHT TOTAL HIP ARTHROPLASTY ANTERIOR APPROACH;  Surgeon: Mcarthur Rossetti, MD;  Location: [place];  Service: Orthopedics;  Laterality: Right;    Allergies  Allergen Reactions   Penicillins Anaphylaxis and Swelling    Local swelling, throat swelling    Sulfa Antibiotics Anaphylaxis and Hives        Beta Adrenergic Blockers Other (See Comments)    bradycardia    Allergies as of 09/05/2020       Reactions   Penicillins Anaphylaxis, Swelling   Local swelling, throat swelling   Sulfa Antibiotics Anaphylaxis, Hives      Beta Adrenergic Blockers Other (See Comments)   bradycardia        Medication List        Accurate as of September 05, 2020  1:59 PM. If you have any questions, ask your nurse or doctor.          acetaminophen 650 MG CR tablet Commonly known as: TYLENOL Take 1,300 mg by mouth 2 (two) times daily.   cimetidine 200 MG tablet Commonly known as: TAGAMET Take 200 mg by mouth daily.   Eliquis 5 MG Tabs tablet Generic drug: apixaban TAKE 1 TABLET BY MOUTH 2 TIMES A DAY   escitalopram 20 MG tablet Commonly known as: LEXAPRO TAKE 1 TABLET BY MOUTH  EVERY EVENING   furosemide 20 MG tablet Commonly known as: LASIX TAKE 2 TABLETS (40 MG TOTAL) BY MOUTH DAILY   hydrALAZINE 100 MG tablet Commonly known as: APRESOLINE Take 1 tablet (100 mg total) by mouth 3 (three) times daily.   losartan 100 MG tablet Commonly known as: COZAAR TAKE 1 TABLET BY MOUTH EVERY DAY   multivitamin tablet Take 1 tablet by mouth daily.   PROBIOTIC DAILY PO Take 1 capsule by mouth daily.   rosuvastatin 10 MG tablet Commonly known as: CRESTOR TAKE 1/2 TABLET BY MOUTH TWICE PER WEEK FOR CHOLESTEROL        Review of Systems  Constitutional:  Negative for chills, fatigue, fever and unexpected weight change.       Not eating as much as she used to.does not skip any meals   HENT:  Negative for congestion, dental problem, ear discharge, ear pain, facial swelling, hearing loss, nosebleeds, postnasal drip, rhinorrhea, sinus pressure, sinus pain, sneezing, sore throat, tinnitus and trouble swallowing.   Eyes:  Negative for pain, discharge, redness, itching and visual disturbance.  Respiratory:  Negative for cough, chest tightness, shortness of breath and wheezing.        Afib   Cardiovascular:  Negative for chest pain, palpitations and leg swelling.  Gastrointestinal:  Negative for abdominal distention, abdominal pain, blood in stool, nausea and vomiting.       Mixed IBS   Endocrine: Negative for cold intolerance, heat intolerance, polydipsia, polyphagia and polyuria.  Genitourinary:  Negative for difficulty urinating, dysuria, flank pain, frequency and urgency.  Musculoskeletal:  Positive for arthralgias and gait problem. Negative for back pain, joint swelling, myalgias, neck pain and neck stiffness.       Right shoulder chronic pain   Skin:  Negative for color change, pallor, rash and wound.  Neurological:  Negative for dizziness, syncope, speech difficulty, weakness, light-headedness and headaches.       Numbness/tingling sensation  on right hand  Hx TIA  with right hand weakness   Hematological:  Does not bruise/bleed easily.  Psychiatric/Behavioral:  Positive for sleep disturbance. Negative for agitation, behavioral problems, confusion, hallucinations, self-injury and suicidal ideas. The patient is not nervous/anxious.        Depression symptoms well controlled    Immunization History  Administered Date(s) Administered   Influenza, High Dose Seasonal PF 11/23/2015, 12/10/2016, 11/15/2017   Influenza-Unspecified 11/07/2018, 10/09/2019   PFIZER(Purple Top)SARS-COV-2 Vaccination 02/25/2019, 03/18/2019, 12/17/2019   Pneumococcal Conjugate-13 11/17/2014   Pneumococcal Polysaccharide-23 05/19/2003   Tdap 12/14/2010, 08/24/2020   Zoster, Live 10/09/2005   Pertinent  Health Maintenance Due  Topic Date Due   DEXA SCAN  Never done   INFLUENZA VACCINE  09/05/2020   PNA vac Low Risk Adult  Completed   Fall Risk  09/02/2020  Falls in the past year? 1  Number falls in past yr: 1  Injury with Fall? 1  Risk for fall due to : History of fall(s)  Follow up Falls evaluation completed   Functional Status Survey:    Vitals:   09/05/20 1348  BP: (!) 146/70  Pulse: 79  Resp: 16  Temp: 97.8 F (36.6 C)  SpO2: 97%  Weight: 143 lb (64.9 kg)  Height: 4' 10"  (1.473 m)   Body mass index is 29.89 kg/m. Physical Exam Vitals reviewed.  Constitutional:      General: She is not in acute distress.    Appearance: Normal appearance. She is overweight. She is not ill-appearing or diaphoretic.  HENT:     Head: Normocephalic.     Right Ear: Tympanic membrane, ear canal and external ear normal. There is no impacted cerumen.     Left Ear: Tympanic membrane, ear canal and external ear normal. There is no impacted cerumen.     Ears:     Comments: Slight HOH     Nose: Nose normal. No congestion or rhinorrhea.     Mouth/Throat:     Mouth: Mucous membranes are moist.     Pharynx: Oropharynx is clear. No oropharyngeal exudate or posterior oropharyngeal  erythema.  Eyes:     General: No scleral icterus.       Right eye: No discharge.        Left eye: No discharge.     Extraocular Movements: Extraocular movements intact.     Conjunctiva/sclera: Conjunctivae normal.     Pupils: Pupils are equal, round, and reactive to light.  Neck:     Vascular: No carotid bruit.  Cardiovascular:     Rate and Rhythm: Normal rate and regular rhythm.     Pulses: Normal pulses.     Heart sounds: Normal heart sounds. No murmur heard.   No friction rub. No gallop.  Pulmonary:  Effort: Pulmonary effort is normal. No respiratory distress.     Breath sounds: Normal breath sounds. No wheezing, rhonchi or rales.  Chest:     Chest wall: No tenderness.  Abdominal:     General: Bowel sounds are normal. There is no distension.     Palpations: Abdomen is soft. There is no mass.     Tenderness: There is no abdominal tenderness. There is no right CVA tenderness, left CVA tenderness, guarding or rebound.  Musculoskeletal:        General: No swelling.     Right shoulder: Tenderness present. No swelling, effusion or crepitus. Decreased range of motion. Normal strength. Normal pulse.     Left shoulder: Normal.     Cervical back: Normal range of motion. No rigidity or tenderness.     Right lower leg: No edema.     Left lower leg: No edema.     Comments: Ambulates with four point cane and has walker at home   Lymphadenopathy:     Cervical: No cervical adenopathy.  Skin:    General: Skin is warm and dry.     Coloration: Skin is not pale.     Findings: No bruising, erythema, lesion or rash.  Neurological:     Mental Status: She is alert and oriented to person, place, and time.     Cranial Nerves: No cranial nerve deficit.     Sensory: No sensory deficit.     Motor: No weakness.     Coordination: Coordination normal.     Gait: Gait abnormal.  Psychiatric:        Mood and Affect: Mood normal.        Speech: Speech normal.        Behavior: Behavior normal.         Thought Content: Thought content normal.        Judgment: Judgment normal.    Labs reviewed: Recent Labs    03/30/20 1003 06/08/20 1132  NA 143 140  K 4.9 4.6  CL 103 102  CO2 22 22  GLUCOSE 123* 103*  BUN 17 22  CREATININE 1.50* 1.34*  CALCIUM 10.2 9.3   Recent Labs    03/30/20 1003  AST 31  ALT 21  ALKPHOS 72  BILITOT 0.4  PROT 7.2  ALBUMIN 4.6   Recent Labs    03/30/20 1003  WBC 5.0  HGB 12.9  HCT 39.5  MCV 95  PLT 213   Lab Results  Component Value Date   TSH 5.900 (H) 12/16/2018   No results found for: HGBA1C Lab Results  Component Value Date   CHOL 170 03/30/2020   HDL 42 03/30/2020   LDLCALC 98 03/30/2020   TRIG 171 (H) 03/30/2020   CHOLHDL 4.0 03/30/2020    Significant Diagnostic Results in last 30 days:  CT Head Wo Contrast  Result Date: 08/24/2020 CLINICAL DATA:  87 year old post fall watering plants. EXAM: CT HEAD WITHOUT CONTRAST TECHNIQUE: Contiguous axial images were obtained from the base of the skull through the vertex without intravenous contrast. COMPARISON:  Head CT 02/06/2017 FINDINGS: Brain: No hemorrhage. Expected evolution of left occipital infarct from prior exam with moderate area of encephalomalacia. Periventricular chronic small vessel ischemia seen. There is no hydrocephalus, midline shift or mass effect. No subdural or extra-axial collection. Senescent basal gangliar calcifications. Vascular: Atherosclerosis of skullbase vasculature without hyperdense vessel or abnormal calcification. Skull: No fracture or focal lesion. Sinuses/Orbits: Minor mucosal thickening of ethmoid air cells. Mucous retention cysts in  both maxillary sinuses. There is a right nasal bone fracture that was not seen on prior. Mastoid air cells are clear. Other: Scalp hematoma and laceration just to the right of midline in the low frontal region. IMPRESSION: 1. Scalp hematoma and laceration just to the right of midline in the low frontal region. No acute  intracranial abnormality. No skull fracture. 2. Remote left occipital infarct. Chronic small vessel ischemia. 3. Right nasal bone fracture, not seen on prior exam, age indeterminate. Electronically Signed   By: Keith Rake M.D.   On: 08/24/2020 21:12   CT Cervical Spine Wo Contrast  Result Date: 08/24/2020 CLINICAL DATA:  87 year old post fall watering plants. EXAM: CT CERVICAL SPINE WITHOUT CONTRAST TECHNIQUE: Multidetector CT imaging of the cervical spine was performed without intravenous contrast. Multiplanar CT image reconstructions were also generated. COMPARISON:  Radiograph 03/25/2019 FINDINGS: Alignment: Mild degenerative listhesis without traumatic subluxation. Trace retrolisthesis of C3 on C4 and anterolisthesis of C4 on C5. Trace anterolisthesis of C6 on C7 and C7 on T1. Skull base and vertebrae: No acute fracture. Vertebral body heights are maintained. The dens and skull base are intact. Soft tissues and spinal canal: No prevertebral fluid or swelling. No visible canal hematoma. Disc levels: Posterior disc osteophyte complex at C3-C4 causes mild narrowing of the spinal canal. Degenerative disc disease at C5-C6. There is multilevel facet hypertrophy. Upper chest: Nonacute. Other: None. IMPRESSION: Multilevel degenerative change in the cervical spine without acute fracture or subluxation. Electronically Signed   By: Keith Rake M.D.   On: 08/24/2020 21:09    Assessment/Plan 1. Encounter for medical examination to establish care Immunization record reviewed advised to get her shingrix and second COVID-19 vaccine at her pharmacy. - CBC with Differential/Platelet; Future - CMP with eGFR([facility]); Future - TSH; Future - Lipid panel; Future - Hemoglobin A1c; Future  2. Essential hypertension B/p stable  - continue on losartan,Hydralazine and furosemide  - continue to follow up with Cardiologist  - Ambulatory referral to [place] with Differential/Platelet; Future - CMP with  eGFR([facility]); Future - TSH; Future - Lipid panel; Future  3. Permanent atrial fibrillation (HCC) Controlled.denies any palpitation during visit Continue on EliQuis  managed by Cardiologist  - Ambulatory referral to [place] Referral to [place] [facility] - CBC with Differential/Platelet; Future - CMP with eGFR([facility]); Future  4. H/O: CVA (cerebrovascular accident) Continue on rosuvastatin  - Ambulatory referral to [place] for gait stability,exercise,ROM and muscle strengthening. - AMB Referral to [place] Management request assistance with current bath tub shower to change to a walking shower due to frequent falls   5. Primary osteoarthritis of right shoulder Start on Gabapentin would prefer Mobic but interacts with her EliQuis.  - Ambulatory referral to [place] as above  - gabapentin (NEURONTIN) 100 MG capsule; Take 1 capsule (100 mg total) by mouth 3 (three) times daily.  Dispense: 90 capsule; Refill: 3  6. Prediabetes No latest Hgb A1C for review - Ambulatory referral to [place] - Hemoglobin A1c; Future  7. GERD without esophagitis Symptoms well controlled on cimetidine  - Ambulatory referral to [place] with Differential/Platelet; Future  8. Unsteady gait Has had frequent falls will order Home health Physical therapy for gait stability,exercise,ROM and muscle strengthening. - Ambulatory referral to [place] - AMB Referral to [place] Management - CBC with Differential/Platelet; Future - CMP with eGFR([facility]); Future  9. Irritable bowel syndrome with both constipation and diarrhea Chronic diarrhea and sometimes constipation  10. Postmenopausal estrogen deficiency No previous bone density for review.made aware imaging center will call her for appointments  - DG Bone Density; Future  11. Primary insomnia Advised to take melatonin 3-6 mg tablet at bedtime - melatonin 3 MG TABS tablet; Take 1 tablet (3 mg total) by mouth at bedtime.   Dispense: 90 tablet; Refill: 1  12. Overgrown toenails Bilateral feet overgrown toenails.will refer to podiatrist to trim long toenails.  - Ambulatory referral to Podiatry  13. Overweight with body mass index (BMI) 25.0-29.9 BMI 29.89  Dietary modification and exercise as tolerated  14. Body mass index 29.0-29.9, adult Dietary modification and exercise as above   Family/ staff Communication: Reviewed plan of care with patient and daughter verbalized understanding   Labs/tests ordered:  - CBC with Differential/Platelet - CMP with eGFR([facility]) - TSH - Hgb A1C - Lipid panel - DG Bone Density; Future  Next Appointment : 6 months for medical management of chronic issues.Fasting Labs prior in 1 week at [employer] at [facility] in [place], NP

## 2020-09-06 ENCOUNTER — Telehealth: Payer: Self-pay | Admitting: Family

## 2020-09-06 ENCOUNTER — Telehealth: Payer: Self-pay

## 2020-09-06 NOTE — Patient Outreach (Signed)
Received a referral from Richarda Blade, NP for care management. Unfortunately, our Care management team will not be able to assist with this patient at this time.  The patient is not on the current member enrollment rosters for any of the [facility] risk contracted plans.  The payer plans send [place] this roster regularly and we have checked the list for this member.    [facility] Care Management Assistant

## 2020-09-06 NOTE — Telephone Encounter (Signed)
Message received from Assunta Found from [facility] states [facility] care management Team unable to assist with building walking shower patient not  on current member enrollment roaster for any of the [facility] risk contracted plans. Please notify patient.

## 2020-09-07 NOTE — Telephone Encounter (Signed)
LMOM to return call.

## 2020-09-07 NOTE — Telephone Encounter (Signed)
Patient daughter notified and agreed.  

## 2020-09-09 ENCOUNTER — Telehealth: Payer: Self-pay | Admitting: *Deleted

## 2020-09-09 DIAGNOSIS — I4821 Permanent atrial fibrillation: Secondary | ICD-10-CM | POA: Diagnosis not present

## 2020-09-09 DIAGNOSIS — K582 Mixed irritable bowel syndrome: Secondary | ICD-10-CM

## 2020-09-09 DIAGNOSIS — I130 Hypertensive heart and chronic kidney disease with heart failure and stage 1 through stage 4 chronic kidney disease, or unspecified chronic kidney disease: Secondary | ICD-10-CM | POA: Diagnosis not present

## 2020-09-09 DIAGNOSIS — N183 Chronic kidney disease, stage 3 unspecified: Secondary | ICD-10-CM | POA: Diagnosis not present

## 2020-09-09 DIAGNOSIS — M50322 Other cervical disc degeneration at C5-C6 level: Secondary | ICD-10-CM

## 2020-09-09 DIAGNOSIS — M4313 Spondylolisthesis, cervicothoracic region: Secondary | ICD-10-CM

## 2020-09-09 DIAGNOSIS — I5032 Chronic diastolic (congestive) heart failure: Secondary | ICD-10-CM | POA: Diagnosis not present

## 2020-09-09 DIAGNOSIS — I272 Pulmonary hypertension, unspecified: Secondary | ICD-10-CM

## 2020-09-09 DIAGNOSIS — M4312 Spondylolisthesis, cervical region: Secondary | ICD-10-CM

## 2020-09-09 DIAGNOSIS — M47816 Spondylosis without myelopathy or radiculopathy, lumbar region: Secondary | ICD-10-CM

## 2020-09-09 NOTE — Telephone Encounter (Signed)
Debroah Loop with [facility] called requesting verbal orders for PT 2x5 1x1 Verbal orders given.

## 2020-10-10 ENCOUNTER — Other Ambulatory Visit: Payer: Self-pay | Admitting: Cardiology

## 2020-10-11 ENCOUNTER — Other Ambulatory Visit: Payer: Self-pay

## 2020-10-11 MED ORDER — APIXABAN 5 MG PO TABS: 5.0000 mg | ORAL_TABLET | Freq: Two times a day (BID) | ORAL | 1 refills | Status: DC

## 2020-10-11 NOTE — Telephone Encounter (Signed)
Prescription refill request for Eliquis received. Indication:atrial fib Last office visit:5/22 Scr:1.3 Age: 87 Weight:64.9 kg  Prescription refilled

## 2020-10-11 NOTE — Telephone Encounter (Signed)
Prescription refill request for Eliquis received. Indication:afib Last office visit:crenshaw 06/08/20 Scr:1.34 06/08/20 Age: 71f Weight:64.9kg

## 2020-11-17 ENCOUNTER — Ambulatory Visit (HOSPITAL_BASED_OUTPATIENT_CLINIC_OR_DEPARTMENT_OTHER)
Admission: RE | Admit: 2020-11-17 | Discharge: 2020-11-17 | Disposition: A | Discharge status: Home / Self Care | Payer: Medicare HMO | Source: Ambulatory Visit | Attending: Family | Admitting: Family

## 2020-11-17 ENCOUNTER — Other Ambulatory Visit: Payer: Self-pay

## 2020-11-17 DIAGNOSIS — Z780 Asymptomatic menopausal state: Secondary | ICD-10-CM | POA: Diagnosis not present

## 2020-11-28 ENCOUNTER — Other Ambulatory Visit: Payer: Self-pay | Admitting: Cardiology

## 2020-12-09 ENCOUNTER — Other Ambulatory Visit: Payer: Self-pay | Admitting: Cardiology

## 2020-12-09 DIAGNOSIS — R0602 Shortness of breath: Secondary | ICD-10-CM

## 2021-01-04 ENCOUNTER — Ambulatory Visit (INDEPENDENT_AMBULATORY_CARE_PROVIDER_SITE_OTHER): Payer: Medicare HMO | Admitting: Surgical

## 2021-01-04 ENCOUNTER — Other Ambulatory Visit: Payer: Self-pay

## 2021-01-04 ENCOUNTER — Ambulatory Visit: Payer: Self-pay

## 2021-01-04 DIAGNOSIS — M19011 Primary osteoarthritis, right shoulder: Secondary | ICD-10-CM

## 2021-01-05 MED ORDER — LIDOCAINE HCL 1 % IJ SOLN
5.0000 mL | INTRAMUSCULAR | Status: AC | PRN
  Administered 2021-01-04: 5 mL

## 2021-01-05 MED ORDER — METHYLPREDNISOLONE ACETATE 40 MG/ML IJ SUSP
40.0000 mg | INTRAMUSCULAR | Status: AC | PRN
  Administered 2021-01-04: 40 mg via INTRA_ARTICULAR

## 2021-01-05 MED ORDER — BUPIVACAINE HCL 0.5 % IJ SOLN
9.0000 mL | INTRAMUSCULAR | Status: AC | PRN
  Administered 2021-01-04: 9 mL via INTRA_ARTICULAR

## 2021-01-05 NOTE — Progress Notes (Signed)
Office Visit Note   Patient: Lorraine Page           Date of Birth: 01/12/1933           MRN: [id number] Visit Date: 01/04/2021 Requested by: Caesar Bookman, NP [address] [place],  [place] [address] PCP: Caesar Bookman, NP  Subjective: Chief Complaint  Patient presents with   Right Shoulder - Follow-up    HPI: Lorraine Page is a 87 y.o. female who presents to the office complaining of right shoulder pain.  Patient has history of glenohumeral arthritis and rotator cuff arthropathy.  Last injection was 06/13/2020 and she tolerated this well and it provided several months of relief.  Pain is slowly returned and she request another injection.  No radicular pain, numbness/tingling, difficulty lifting her arm up..                ROS: All systems reviewed are negative as they relate to the chief complaint within the history of present illness.  Patient denies fevers or chills.  Assessment & Plan: Visit Diagnoses:  1. Glenohumeral arthritis, right     Plan: Patient is a 87 year old female who presents for complaint of right shoulder pain.  She has history of rotator cuff arthropathy and has been receiving occasional injections with good relief.  Last injection was in May of this year.  This did well for her.  Glenohumeral injection administered today and patient tolerated the procedure well.  Follow-up as needed with next possible injection in about 3 months.  Ultrasound was utilized to help locate the glenohumeral joint.  Follow-Up Instructions: No follow-ups on file.   Orders:  Orders Placed This Encounter  Procedures   US Guided Needle Placement - No Linked Charges   No orders of the defined types were placed in this encounter.     Procedures: Large Joint Inj: R glenohumeral on 01/04/2021 8:38 AM Indications: diagnostic evaluation and pain Details: 18 G 1.5 in needle, posterior approach  Arthrogram: No  Medications: 9 mL bupivacaine 0.5 %; 40 mg methylPREDNISolone  acetate 40 MG/ML; 5 mL lidocaine 1 % Outcome: tolerated well, no immediate complications Procedure, treatment alternatives, risks and benefits explained, specific risks discussed. Consent was given by the patient. Immediately prior to procedure a time out was called to verify the correct patient, procedure, equipment, support staff and site/side marked as required. Patient was prepped and draped in the usual sterile fashion.      Clinical Data: No additional findings.  Objective: Vital Signs: There were no vitals taken for this visit.  Physical Exam:  Constitutional: Patient appears well-developed HEENT:  Head: Normocephalic Eyes:EOM are normal Neck: Normal range of motion Cardiovascular: Normal rate Pulmonary/chest: Effort normal Neurologic: Patient is alert Skin: Skin is warm Psychiatric: Patient has normal mood and affect  Ortho Exam: Ortho exam demonstrates right shoulder with 80 degrees abduction and 130 degrees forward flexion.  Weakness with infraspinatus and supraspinatus.  Decent subscapularis strength.  Specialty Comments:  No specialty comments available.  Imaging: No results found.   PMFS History: Patient Active Problem List   Diagnosis Date Noted   Pulmonary hypertension, moderate to severe (HCC) 02/11/2019   Permanent atrial fibrillation (HCC) 12/16/2018   Chronic anticoagulation 12/16/2018   H/O: CVA (cerebrovascular accident) 12/16/2018   CRI (chronic renal insufficiency), stage 3 (moderate) (HCC) 12/16/2018   Essential hypertension 12/16/2018   GERD (gastroesophageal reflux disease) 12/16/2018   Shortness of breath 12/16/2018   Status post total replacement of  right hip 08/12/2018   Degenerative joint disease (DJD) of hip 03/19/2018   Past Medical History:  Diagnosis Date   Atrial fibrillation (HCC)    Chronic kidney disease, stage 3 (HCC)    CVA (cerebral vascular accident) ([place])    GERD (gastroesophageal reflux disease)    GERD  (gastroesophageal reflux disease)    Hyperlipidemia    Hypertension    Osteoarthritis    Prediabetes     Family History  Problem Relation Age of Onset   Cancer Mother    Stomach cancer Mother    CAD Father    Pneumonia Sister    Breast cancer Sister    Aortic stenosis Sister    Congestive Heart Failure Sister    Gastric cancer Brother     Past Surgical History:  Procedure Laterality Date   carpel tunnel Right    CHOLECYSTECTOMY     RHINOPLASTY     TOTAL HIP ARTHROPLASTY Right 08/12/2018   Procedure: RIGHT TOTAL HIP ARTHROPLASTY ANTERIOR APPROACH;  Surgeon: Mcarthur Rossetti, MD;  Location: [facility];  Service: Orthopedics;  Laterality: Right;   Social History   Occupational History   Not on file  Tobacco Use   Smoking status: Former   Smokeless tobacco: Never  Vaping Use   Vaping Use: Never used  Substance and Sexual Activity   Alcohol use: Not Currently   Drug use: Never   Sexual activity: Not on file

## 2021-01-10 ENCOUNTER — Other Ambulatory Visit: Payer: Self-pay | Admitting: Cardiology

## 2021-01-10 DIAGNOSIS — I10 Essential (primary) hypertension: Secondary | ICD-10-CM

## 2021-02-14 ENCOUNTER — Other Ambulatory Visit: Payer: Self-pay | Admitting: *Deleted

## 2021-02-14 DIAGNOSIS — I4821 Permanent atrial fibrillation: Secondary | ICD-10-CM

## 2021-02-14 DIAGNOSIS — E7800 Pure hypercholesterolemia, unspecified: Secondary | ICD-10-CM

## 2021-03-07 ENCOUNTER — Other Ambulatory Visit: Payer: Self-pay | Admitting: Cardiology

## 2021-03-07 DIAGNOSIS — R0602 Shortness of breath: Secondary | ICD-10-CM

## 2021-03-09 ENCOUNTER — Ambulatory Visit: Payer: Medicare HMO | Admitting: Family

## 2021-03-27 ENCOUNTER — Encounter: Payer: Self-pay | Admitting: Cardiology

## 2021-03-27 ENCOUNTER — Other Ambulatory Visit: Payer: Self-pay

## 2021-03-27 ENCOUNTER — Ambulatory Visit (INDEPENDENT_AMBULATORY_CARE_PROVIDER_SITE_OTHER): Payer: Medicare HMO | Admitting: Family

## 2021-03-27 ENCOUNTER — Encounter: Payer: Self-pay | Admitting: Family

## 2021-03-27 VITALS — BP 140/78 | HR 67 | Temp 97.0°F | Ht <= 58 in | Wt 132.0 lb

## 2021-03-27 DIAGNOSIS — Z8673 Personal history of transient ischemic attack (TIA), and cerebral infarction without residual deficits: Secondary | ICD-10-CM

## 2021-03-27 DIAGNOSIS — I4821 Permanent atrial fibrillation: Secondary | ICD-10-CM

## 2021-03-27 DIAGNOSIS — K219 Gastro-esophageal reflux disease without esophagitis: Secondary | ICD-10-CM

## 2021-03-27 DIAGNOSIS — R7303 Prediabetes: Secondary | ICD-10-CM

## 2021-03-27 DIAGNOSIS — I10 Essential (primary) hypertension: Secondary | ICD-10-CM

## 2021-03-27 DIAGNOSIS — R2681 Unsteadiness on feet: Secondary | ICD-10-CM

## 2021-03-27 NOTE — Progress Notes (Signed)
Provider: Marlowe Sax FNP-C   Bea Duren, Nelda Bucks, NP  Patient Care Team: Mali Eppard, Nelda Bucks, NP as PCP - General (Family Medicine) Lelon Perla, MD as PCP - Cardiology (Cardiology) Mcarthur Rossetti, MD as Consulting Physician (Orthopedic Surgery) Roel Cluck, MD as Referring Physician (Ophthalmology) Stanford Breed Denice Bors, MD as Consulting Physician (Cardiology)  Extended Emergency Contact Information Primary Emergency Contact: Kozikowski,Catherine Mobile Phone: [phone] Relation: Daughter Interpreter needed? No Secondary Emergency Contact: [place] Mobile Phone: [phone] Relation: Son Interpreter needed? No  Code Status:  Full Code  Goals of care: Advanced Directive information Advanced Directives 03/27/2021  Does Patient Have a Medical Advance Directive? Yes  Type of Paramedic of [place];Living will;Out of facility DNR (pink MOST or yellow form)  Does patient want to make changes to medical advance directive? -  Copy of [place] in Chart? No - copy requested  Would patient like information on creating a medical advance directive? -     Chief Complaint  Patient presents with   Medical Management of Chronic Issues    6 month follow up.   Health Maintenance    Discuss need for shingrix vaccine,2nd COVID booster,Flu vaccine    HPI:  Pt is a 88 y.o. female seen today for 6 months follow up for medical management of chronic diseases. She is here with her daughter today. She denies any acute issues. Due for shingles vaccine. On chart review,she has had one PNA 23 unclear if she had both.Has completed PNA 13.Daughter states previous PCP closed the practice.  Has had no recent fall episode.she did work with Physical therapy after her last fall She walks with a walker at home but using a cane to visit.  She is due for fasting lab work but daughter states has lab work schedule with Cardiologist.Would like to  add TSH level to labs done by [employer].   Past Medical History:  Diagnosis Date   Atrial fibrillation ([place])    Chronic kidney disease, stage 3 ([place])    CVA (cerebral vascular accident) ([place])    GERD (gastroesophageal reflux disease)    GERD (gastroesophageal reflux disease)    Hyperlipidemia    Hypertension    Osteoarthritis    Prediabetes    Past Surgical History:  Procedure Laterality Date   carpel tunnel Right    CHOLECYSTECTOMY     RHINOPLASTY     TOTAL HIP ARTHROPLASTY Right 08/12/2018   Procedure: RIGHT TOTAL HIP ARTHROPLASTY ANTERIOR APPROACH;  Surgeon: Mcarthur Rossetti, MD;  Location: [facility];  Service: Orthopedics;  Laterality: Right;    Allergies  Allergen Reactions   Penicillins Anaphylaxis and Swelling    Local swelling, throat swelling    Sulfa Antibiotics Anaphylaxis and Hives        Beta Adrenergic Blockers Other (See Comments)    bradycardia    Allergies as of 03/27/2021       Reactions   Penicillins Anaphylaxis, Swelling   Local swelling, throat swelling   Sulfa Antibiotics Anaphylaxis, Hives      Beta Adrenergic Blockers Other (See Comments)   bradycardia        Medication List        Accurate as of March 27, 2021  2:37 PM. If you have any questions, ask your nurse or doctor.          STOP taking these medications    melatonin 3 MG Tabs tablet Stopped by: Sandrea Hughs, NP  TAKE these medications    acetaminophen 650 MG CR tablet Commonly known as: TYLENOL Take 1,300 mg by mouth 2 (two) times daily.   apixaban 5 MG Tabs tablet Commonly known as: Eliquis Take 1 tablet (5 mg total) by mouth 2 (two) times daily.   CALCIUM 600+D3 PO Take 1 tablet by mouth daily.   cimetidine 200 MG tablet Commonly known as: TAGAMET Take 200 mg by mouth daily.   escitalopram 20 MG tablet Commonly known as: LEXAPRO TAKE 1 TABLET BY MOUTH EVERY EVENING   furosemide 20 MG tablet Commonly known as: LASIX TAKE 2 TABLETS  (40 MG) BY MOUTH DAILY   gabapentin 100 MG capsule Commonly known as: NEURONTIN Take 100 mg by mouth daily. What changed: Another medication with the same name was removed. Continue taking this medication, and follow the directions you see here. Changed by: Sandrea Hughs, NP   hydrALAZINE 100 MG tablet Commonly known as: APRESOLINE Take 1 tablet (100 mg total) by mouth 3 (three) times daily.   losartan 100 MG tablet Commonly known as: COZAAR TAKE 1 TABLET BY MOUTH EVERY DAY   multivitamin tablet Take 1 tablet by mouth daily.   PROBIOTIC DAILY PO Take 1 capsule by mouth daily.   rosuvastatin 10 MG tablet Commonly known as: CRESTOR TAKE 1/2 TABLET BY MOUTH TWICE PER WEEK FOR CHOLESTEROL        Review of Systems  Constitutional:  Negative for appetite change, chills, fatigue, fever and unexpected weight change.  HENT:  Negative for congestion, dental problem, ear discharge, ear pain, facial swelling, hearing loss, nosebleeds, postnasal drip, rhinorrhea, sinus pressure, sinus pain, sneezing, sore throat, tinnitus and trouble swallowing.   Eyes:  Negative for pain, discharge, redness, itching and visual disturbance.  Respiratory:  Negative for cough, chest tightness, shortness of breath and wheezing.   Cardiovascular:  Negative for chest pain, palpitations and leg swelling.  Gastrointestinal:  Negative for abdominal distention, abdominal pain, blood in stool, constipation, diarrhea, nausea and vomiting.  Endocrine: Negative for cold intolerance, heat intolerance, polydipsia, polyphagia and polyuria.  Genitourinary:  Negative for difficulty urinating, dysuria, flank pain, frequency and urgency.  Musculoskeletal:  Negative for arthralgias, back pain, gait problem, joint swelling, myalgias, neck pain and neck stiffness.  Skin:  Negative for color change, pallor, rash and wound.  Neurological:  Negative for dizziness, syncope, speech difficulty, weakness, light-headedness, numbness  and headaches.  Hematological:  Does not bruise/bleed easily.  Psychiatric/Behavioral:  Negative for agitation, behavioral problems, confusion, hallucinations, self-injury, sleep disturbance and suicidal ideas. The patient is not nervous/anxious.    Immunization History  Administered Date(s) Administered   Influenza, High Dose Seasonal PF 11/23/2015, 12/10/2016, 11/15/2017   Influenza-Unspecified 11/07/2018, 10/09/2019, 12/11/2020   PFIZER(Purple Top)SARS-COV-2 Vaccination 02/25/2019, 03/18/2019, 12/17/2019   Pfizer Covid-19 Vaccine Bivalent Booster 11yrs & up 12/11/2020   Pneumococcal Conjugate-13 11/17/2014   Pneumococcal Polysaccharide-23 05/19/2003   Tdap 12/14/2010, 08/24/2020   Zoster, Live 10/09/2005   Pertinent  Health Maintenance Due  Topic Date Due   INFLUENZA VACCINE  Completed   DEXA SCAN  Completed   Fall Risk 08/13/2018 08/15/2018 08/24/2020 09/02/2020 03/27/2021  Falls in the past year? - - - 1 0  Was there an injury with Fall? - - - 1 0  Fall Risk Category Calculator - - - 3 0  Fall Risk Category - - - High Low  Patient Fall Risk Level High fall risk High fall risk High fall risk High fall risk Low fall risk  Patient  at Risk for Falls Due to - - - History of fall(s) No Fall Risks  Fall risk Follow up - - - Falls evaluation completed Falls evaluation completed   Functional Status Survey:    Vitals:   03/27/21 1419  BP: 140/78  Pulse: 67  Temp: (!) 97 F (36.1 C)  SpO2: 98%  Weight: 132 lb (59.9 kg)  Height: 4\' 10"  (1.473 m)   Body mass index is 27.59 kg/m. Physical Exam Vitals reviewed.  Constitutional:      General: She is not in acute distress.    Appearance: Normal appearance. She is normal weight. She is not ill-appearing or diaphoretic.  HENT:     Head: Normocephalic.     Right Ear: Tympanic membrane, ear canal and external ear normal. There is no impacted cerumen.     Left Ear: Tympanic membrane, ear canal and external ear normal. There is no  impacted cerumen.     Nose: Nose normal. No congestion or rhinorrhea.     Mouth/Throat:     Mouth: Mucous membranes are moist.     Pharynx: Oropharynx is clear. No oropharyngeal exudate or posterior oropharyngeal erythema.  Eyes:     General: No scleral icterus.       Right eye: No discharge.        Left eye: No discharge.     Extraocular Movements: Extraocular movements intact.     Conjunctiva/sclera: Conjunctivae normal.     Pupils: Pupils are equal, round, and reactive to light.  Neck:     Vascular: No carotid bruit.  Cardiovascular:     Rate and Rhythm: Normal rate and regular rhythm.     Pulses: Normal pulses.     Heart sounds: Normal heart sounds. No murmur heard.   No friction rub. No gallop.  Pulmonary:     Effort: Pulmonary effort is normal. No respiratory distress.     Breath sounds: Normal breath sounds. No wheezing, rhonchi or rales.  Chest:     Chest wall: No tenderness.  Abdominal:     General: Bowel sounds are normal. There is no distension.     Palpations: Abdomen is soft. There is no mass.     Tenderness: There is no abdominal tenderness. There is no right CVA tenderness, left CVA tenderness, guarding or rebound.  Musculoskeletal:        General: No swelling or tenderness. Normal range of motion.     Cervical back: Normal range of motion. No rigidity or tenderness.     Right lower leg: No edema.     Left lower leg: No edema.  Lymphadenopathy:     Cervical: No cervical adenopathy.  Skin:    General: Skin is warm and dry.     Coloration: Skin is not pale.     Findings: No bruising, erythema, lesion or rash.  Neurological:     Mental Status: She is alert and oriented to person, place, and time.     Cranial Nerves: No cranial nerve deficit.     Sensory: No sensory deficit.     Motor: No weakness.     Coordination: Coordination normal.     Gait: Gait abnormal.  Psychiatric:        Mood and Affect: Mood normal.        Speech: Speech normal.        Behavior:  Behavior normal.        Thought Content: Thought content normal.        Judgment: Judgment  normal.    Labs reviewed: Recent Labs    03/30/20 1003 06/08/20 1132  NA 143 140  K 4.9 4.6  CL 103 102  CO2 22 22  GLUCOSE 123* 103*  BUN 17 22  CREATININE 1.50* 1.34*  CALCIUM 10.2 9.3   Recent Labs    03/30/20 1003  AST 31  ALT 21  ALKPHOS 72  BILITOT 0.4  PROT 7.2  ALBUMIN 4.6   Recent Labs    03/30/20 1003  WBC 5.0  HGB 12.9  HCT 39.5  MCV 95  PLT 213   Lab Results  Component Value Date   TSH 5.900 (H) 12/16/2018   No results found for: HGBA1C Lab Results  Component Value Date   CHOL 170 03/30/2020   HDL 42 03/30/2020   LDLCALC 98 03/30/2020   TRIG 171 (H) 03/30/2020   CHOLHDL 4.0 03/30/2020    Significant Diagnostic Results in last 30 days:  No results found.  Assessment/Plan 1. Essential hypertension B/p stable for her age  Continue on Hydralazine and losartan   2. Permanent atrial fibrillation (HCC) HR controlled  - continue on Apixaban for anticoagulation  - Not on BB   3. GERD without esophagitis Symptoms controlled  - continue to avoid eating late in the evening and avoid aggravating foods/spices.   4. Prediabetes - Dietary modification and exercise at least 3 times per week for 30 minutes advised.  5. H/O: CVA (cerebrovascular accident) No residual  - continue to control high risk factors  6. Unsteady gait Fall and safety precautions.   Family/ staff Communication: Reviewed plan of care with patient verbalized understanding   Labs/tests ordered: Has labs ordered by Cardiologist would like to add TSH level to labs  Next Appointment : 6 months for medical management of chronic issues   Sandrea Hughs, NP

## 2021-05-01 ENCOUNTER — Other Ambulatory Visit: Payer: Self-pay | Admitting: Cardiology

## 2021-05-01 DIAGNOSIS — I10 Essential (primary) hypertension: Secondary | ICD-10-CM

## 2021-05-10 NOTE — Progress Notes (Signed)
? ? ? ? ?HPI: FU atrial fibrillation.  Patient was off of Eliquis for hip injection in January 2019 and suffered an embolic CVA. She has some gait instability from previous CVA. Last echocardiogram November 2020 showed normal LV function, restrictive filling, mild biatrial enlargement, mild right ventricular enlargement with mildly reduced RV function, mild to moderate mitral regurgitation, moderate tricuspid regurgitation and severely elevated pulmonary pressures. Patient seen with increased dyspnea and edema November 2020. She was noted to be bradycardic and Lopressor was decreased.  BNP 2794 and mildly elevated TSH. VQ scan was negative.  Pulmonary function test showed minimal obstructive lung disease.  Monitor December 2020 showed sinus bradycardia with occasional PAC, PVC, junctional rhythm and occasional pauses with longest being 4.7 seconds.  Metoprolol discontinued.  Since last seen, patient denies dyspnea, chest pain, palpitations, syncope or bleeding. ? ?Current Outpatient Medications  ?Medication Sig Dispense Refill  ? acetaminophen (TYLENOL) 650 MG CR tablet Take 1,300 mg by mouth 2 (two) times daily.     ? apixaban (ELIQUIS) 2.5 MG TABS tablet Take 1 tablet (2.5 mg total) by mouth 2 (two) times daily. 180 tablet 3  ? Calcium Carb-Cholecalciferol (CALCIUM 600+D3 PO) Take 1 tablet by mouth daily.    ? cimetidine (TAGAMET) 200 MG tablet Take 200 mg by mouth daily.     ? escitalopram (LEXAPRO) 20 MG tablet TAKE 1 TABLET BY MOUTH EVERY EVENING 90 tablet 2  ? furosemide (LASIX) 20 MG tablet TAKE 2 TABLETS (40 MG) BY MOUTH DAILY 180 tablet 1  ? gabapentin (NEURONTIN) 100 MG capsule Take 100 mg by mouth daily.    ? hydrALAZINE (APRESOLINE) 100 MG tablet Take 1 tablet (100 mg total) by mouth 3 (three) times daily. 90 tablet 1  ? losartan (COZAAR) 100 MG tablet TAKE 1 TABLET BY MOUTH EVERY DAY 90 tablet 3  ? Multiple Vitamin (MULTIVITAMIN) tablet Take 1 tablet by mouth daily.    ? Probiotic Product (PROBIOTIC  DAILY PO) Take 1 capsule by mouth daily.     ? rosuvastatin (CRESTOR) 10 MG tablet TAKE 1/2 TABLET BY MOUTH TWICE PER WEEK FOR CHOLESTEROL 30 tablet 3  ? ?No current facility-administered medications for this visit.  ? ? ? ?Past Medical History:  ?Diagnosis Date  ? Atrial fibrillation ([place])   ? Chronic kidney disease, stage 3 (HCC)   ? CVA (cerebral vascular accident) [facility])   ? GERD (gastroesophageal reflux disease)   ? GERD (gastroesophageal reflux disease)   ? Hyperlipidemia   ? Hypertension   ? Osteoarthritis   ? Prediabetes   ? ? ?Past Surgical History:  ?Procedure Laterality Date  ? carpel tunnel Right   ? CHOLECYSTECTOMY    ? RHINOPLASTY    ? TOTAL HIP ARTHROPLASTY Right 08/12/2018  ? Procedure: RIGHT TOTAL HIP ARTHROPLASTY ANTERIOR APPROACH;  Surgeon: Mcarthur Rossetti, MD;  Location: [facility];  Service: Orthopedics;  Laterality: Right;  ? ? ?Social History  ? ?Socioeconomic History  ? Marital status: Widowed  ?  Spouse name: Not on file  ? Number of children: 3  ? Years of education: Not on file  ? Highest education level: Not on file  ?Occupational History  ? Not on file  ?Tobacco Use  ? Smoking status: Former  ? Smokeless tobacco: Never  ?Vaping Use  ? Vaping Use: Never used  ?Substance and Sexual Activity  ? Alcohol use: Not Currently  ? Drug use: Never  ? Sexual activity: Not on file  ?Other Topics Concern  ?  Not on file  ?Social History Narrative  ? Tobacco use, amount per day now: None  ? Past tobacco use, amount per day:   ? How many years did you use tobacco: Quit 1979 ( maybe 20 years )  ? Alcohol use (drinks per week): None  ? Diet:  ? Do you drink/eat things with caffeine: Coffee  ? Marital status:    Widowed                              What year were you married? 47 years married in 1965  ? Do you live in a house, apartment, assisted living, condo, trailer, etc.? House  ? Is it one or more stories? 1  ? How many persons live in your home? 1  ? Do you have pets in your home?( please list)  None.  ? Highest Level of education completed? High School 12th grade.  ? 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  ? Do you exercise?    None                              Type and how often?  ? Do you have a living will? Yes  ? Do you have a DNR form?    No                               If not, do you want to discuss one?  ? Do you have signed POA/HPOA forms?  Yes                      If so, please bring to you appointment  ?   ? Do you have any difficulty bathing or dressing yourself? No  ? Do you have any difficulty preparing food or eating? No  ? Do you have any difficulty managing your medications? No  ? Do you have any difficulty managing your finances? Yes  ? Do you have any difficulty affording your medications? Yes  ? ?Social Determinants of Health  ? ?Financial Resource Strain: Not on file  ?Food Insecurity: Not on file  ?Transportation Needs: Not on file  ?Physical Activity: Not on file  ?Stress: Not on file  ?Social Connections: Not on file  ?Intimate Partner Violence: Not on file  ? ? ?Family History  ?Problem Relation Age of Onset  ? Cancer Mother   ? Stomach cancer Mother   ? CAD Father   ? Pneumonia Sister   ? Breast cancer Sister   ? Aortic stenosis Sister   ? Congestive Heart Failure Sister   ? Gastric cancer Brother   ? ? ?ROS: no fevers or chills, productive cough, hemoptysis, dysphasia, odynophagia, melena, hematochezia, dysuria, hematuria, rash, seizure activity, orthopnea, PND, pedal edema, claudication. Remaining systems are negative. ? ?Physical Exam: ?Well-developed well-nourished in no acute distress.  ?Skin is warm and dry.  ?HEENT is normal.  ?Neck is supple.  ?Chest is clear to auscultation with normal expansion.  ?Cardiovascular exam is regular rate and rhythm.  ?Abdominal exam nontender or distended. No masses palpated. ?Extremities show no edema. ?neuro grossly intact ? ?ECG-normal sinus rhythm at a rate of 69, normal axis, nonspecific ST changes.   Personally reviewed ? ?A/P ? ?1 paroxysmal atrial fibrillation-plan to continue present dose of apixaban (  she is borderline for 5 versus 2.5 mg; she is greater than 58 years of age, her creatinine is 1.35 and her weight today is 61.7 kg; however at most recent primary care visit her weight was 59.9 kg which is what she states she weighs without her clothes in the morning).  Check Beta-blocker was discontinued previously due to bradycardia. ? ?2 hypertension-patient's blood pressure is controlled.  Continue present medications. ? ?3 hyperlipidemia-continue statin. ? ?4 pulmonary hypertension-this was previously felt secondary to pulmonary venous hypertension.  Previous VQ scan and pulmonary function test unremarkable.  Continue diuretics. ? ?5 chronic diastolic congestive heart failure-she appears to be euvolemic.  We will continue diuretic at present dose.   ? ?Kirk Ruths, MD ? ? ? ?

## 2021-05-17 ENCOUNTER — Other Ambulatory Visit: Payer: Self-pay | Admitting: Cardiology

## 2021-05-19 DIAGNOSIS — E7800 Pure hypercholesterolemia, unspecified: Secondary | ICD-10-CM | POA: Diagnosis not present

## 2021-05-19 DIAGNOSIS — I4821 Permanent atrial fibrillation: Secondary | ICD-10-CM | POA: Diagnosis not present

## 2021-05-20 LAB — CBC
Hematocrit: 36.2 % (ref 34.0–46.6)
Hemoglobin: 11.8 g/dL (ref 11.1–15.9)
MCH: 31.4 pg (ref 26.6–33.0)
MCHC: 32.6 g/dL (ref 31.5–35.7)
MCV: 96 fL (ref 79–97)
Platelets: 207 10*3/uL (ref 150–450)
RBC: 3.76 x10E6/uL — ABNORMAL LOW (ref 3.77–5.28)
RDW: 11.8 % (ref 11.7–15.4)
WBC: 4.5 10*3/uL (ref 3.4–10.8)

## 2021-05-20 LAB — LIPID PANEL
Chol/HDL Ratio: 4.3 ratio (ref 0.0–4.4)
Cholesterol, Total: 167 mg/dL (ref 100–199)
HDL: 39 mg/dL — ABNORMAL LOW (ref >39)
LDL Chol Calc (NIH): 96 mg/dL (ref 0–99)
Triglycerides: 185 mg/dL — ABNORMAL HIGH (ref 0–149)
VLDL Cholesterol Cal: 32 mg/dL (ref 5–40)

## 2021-05-20 LAB — COMPREHENSIVE METABOLIC PANEL
ALT: 15 IU/L (ref 0–32)
AST: 26 IU/L (ref 0–40)
Albumin/Globulin Ratio: 2 (ref 1.2–2.2)
Albumin: 4.5 g/dL (ref 3.6–4.6)
Alkaline Phosphatase: 45 IU/L (ref 44–121)
BUN/Creatinine Ratio: 7 — ABNORMAL LOW (ref 12–28)
BUN: 9 mg/dL (ref 8–27)
Bilirubin Total: 0.3 mg/dL (ref 0.0–1.2)
CO2: 24 mmol/L (ref 20–29)
Calcium: 7.8 mg/dL — ABNORMAL LOW (ref 8.7–10.3)
Chloride: 103 mmol/L (ref 96–106)
Creatinine, Ser: 1.35 mg/dL — ABNORMAL HIGH (ref 0.57–1.00)
Globulin, Total: 2.3 g/dL (ref 1.5–4.5)
Glucose: 92 mg/dL (ref 70–99)
Potassium: 4.3 mmol/L (ref 3.5–5.2)
Sodium: 142 mmol/L (ref 134–144)
Total Protein: 6.8 g/dL (ref 6.0–8.5)
eGFR: 38 mL/min/{1.73_m2} — ABNORMAL LOW (ref >59)

## 2021-05-20 LAB — TSH: TSH: 4.4 u[IU]/mL (ref 0.450–4.500)

## 2021-05-22 ENCOUNTER — Telehealth: Payer: Self-pay | Admitting: *Deleted

## 2021-05-22 MED ORDER — APIXABAN 2.5 MG PO TABS: 2.5000 mg | ORAL_TABLET | Freq: Two times a day (BID) | ORAL | 3 refills | Status: DC

## 2021-05-22 NOTE — Telephone Encounter (Signed)
-----   Message from Lewayne Bunting, MD sent at 05/20/2021 12:56 AM EDT ----- ?Apixaban dose should be 2.5 bid ?Olga Millers ? ?

## 2021-05-22 NOTE — Telephone Encounter (Signed)
Left detailed message for daughter about medication change and also sent to them via my chart. ?New script sent to the pharmacy  ?

## 2021-05-24 ENCOUNTER — Encounter: Payer: Self-pay | Admitting: Cardiology

## 2021-05-24 ENCOUNTER — Ambulatory Visit: Payer: Medicare HMO | Admitting: Cardiology

## 2021-05-24 VITALS — BP 138/62 | HR 69 | Ht <= 58 in | Wt 136.1 lb

## 2021-05-24 DIAGNOSIS — I10 Essential (primary) hypertension: Secondary | ICD-10-CM | POA: Diagnosis not present

## 2021-05-24 DIAGNOSIS — I5032 Chronic diastolic (congestive) heart failure: Secondary | ICD-10-CM | POA: Diagnosis not present

## 2021-05-24 DIAGNOSIS — I480 Paroxysmal atrial fibrillation: Secondary | ICD-10-CM | POA: Diagnosis not present

## 2021-05-24 DIAGNOSIS — E7800 Pure hypercholesterolemia, unspecified: Secondary | ICD-10-CM | POA: Diagnosis not present

## 2021-05-24 NOTE — Patient Instructions (Signed)

## 2021-08-07 DIAGNOSIS — Z87891 Personal history of nicotine dependence: Secondary | ICD-10-CM | POA: Diagnosis not present

## 2021-08-07 DIAGNOSIS — Z8673 Personal history of transient ischemic attack (TIA), and cerebral infarction without residual deficits: Secondary | ICD-10-CM | POA: Diagnosis not present

## 2021-08-07 DIAGNOSIS — I517 Cardiomegaly: Secondary | ICD-10-CM | POA: Diagnosis not present

## 2021-08-07 DIAGNOSIS — I340 Nonrheumatic mitral (valve) insufficiency: Secondary | ICD-10-CM | POA: Diagnosis not present

## 2021-08-07 DIAGNOSIS — R0602 Shortness of breath: Secondary | ICD-10-CM | POA: Diagnosis not present

## 2021-08-07 DIAGNOSIS — Z79899 Other long term (current) drug therapy: Secondary | ICD-10-CM | POA: Diagnosis not present

## 2021-08-07 DIAGNOSIS — I252 Old myocardial infarction: Secondary | ICD-10-CM | POA: Diagnosis not present

## 2021-08-07 DIAGNOSIS — I110 Hypertensive heart disease with heart failure: Secondary | ICD-10-CM | POA: Diagnosis not present

## 2021-08-07 DIAGNOSIS — R069 Unspecified abnormalities of breathing: Secondary | ICD-10-CM | POA: Diagnosis not present

## 2021-08-07 DIAGNOSIS — I10 Essential (primary) hypertension: Secondary | ICD-10-CM | POA: Diagnosis not present

## 2021-08-07 DIAGNOSIS — I509 Heart failure, unspecified: Secondary | ICD-10-CM | POA: Diagnosis not present

## 2021-08-07 DIAGNOSIS — I440 Atrioventricular block, first degree: Secondary | ICD-10-CM | POA: Diagnosis not present

## 2021-08-08 DIAGNOSIS — I110 Hypertensive heart disease with heart failure: Secondary | ICD-10-CM | POA: Diagnosis not present

## 2021-08-08 DIAGNOSIS — I509 Heart failure, unspecified: Secondary | ICD-10-CM | POA: Diagnosis not present

## 2021-08-08 DIAGNOSIS — I340 Nonrheumatic mitral (valve) insufficiency: Secondary | ICD-10-CM | POA: Diagnosis not present

## 2021-08-08 DIAGNOSIS — R0602 Shortness of breath: Secondary | ICD-10-CM | POA: Diagnosis not present

## 2021-08-08 DIAGNOSIS — I517 Cardiomegaly: Secondary | ICD-10-CM | POA: Diagnosis not present

## 2021-08-09 DIAGNOSIS — I440 Atrioventricular block, first degree: Secondary | ICD-10-CM | POA: Diagnosis not present

## 2021-08-10 ENCOUNTER — Encounter: Payer: Self-pay | Admitting: Cardiology

## 2021-08-21 ENCOUNTER — Other Ambulatory Visit: Payer: Self-pay | Admitting: Cardiology

## 2021-08-21 NOTE — Telephone Encounter (Signed)
Will need to be refilled by PCP

## 2021-08-22 IMAGING — CT CT HEAD W/O CM
3 series · 14 of 47 positions shown, 16 images · non-contrast
Comparison: Head CT 02/06/2017

CLINICAL DATA: 87-year-old post fall watering plants.

EXAM:
CT HEAD WITHOUT CONTRAST
TECHNIQUE: Contiguous axial images were obtained from the base of the skull
through the vertex without intravenous contrast.

[Series 2: head 5.0 h30s · axial · 0.43mm/px · z∈[+1178,+1303]mm · 8 of 31 slices shown, 10 images]
[im 3/31  brain]
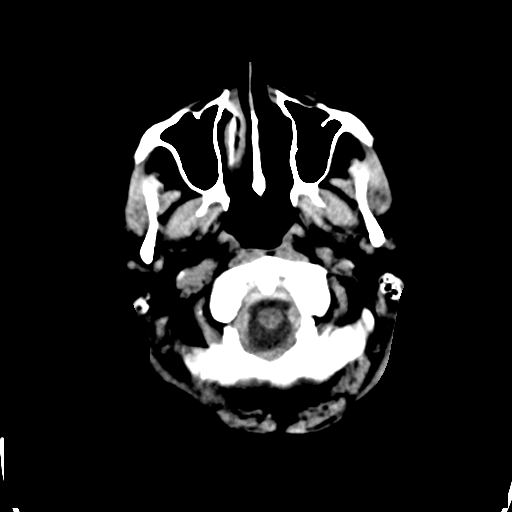
[im 3/31  bone]
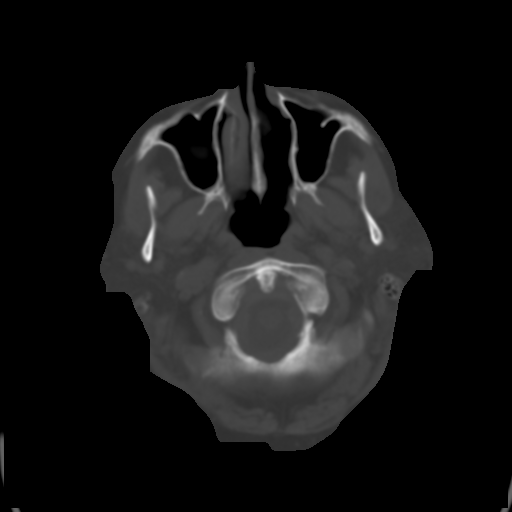
[im 7/31  brain]
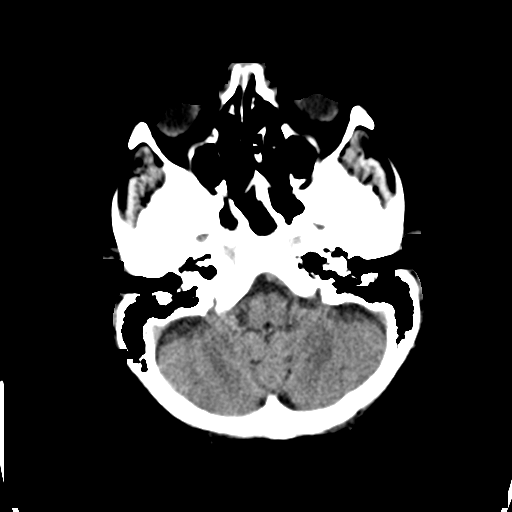
[im 10/31  brain]
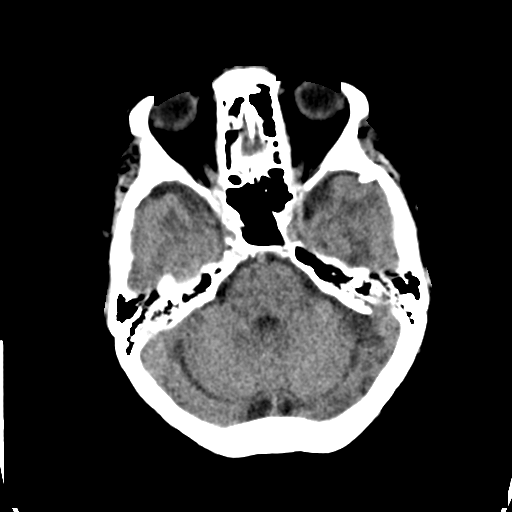
[im 14/31  brain]
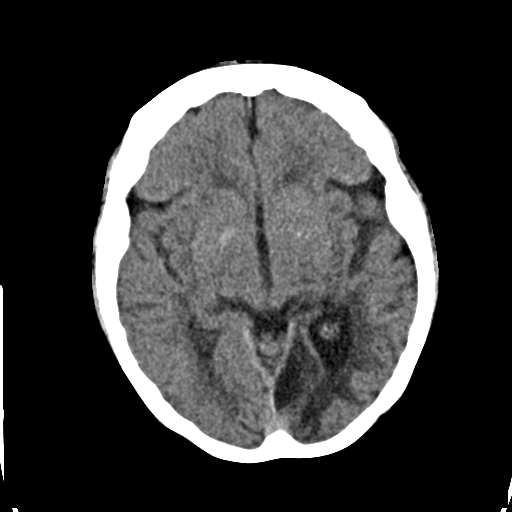
[im 17/31  brain]
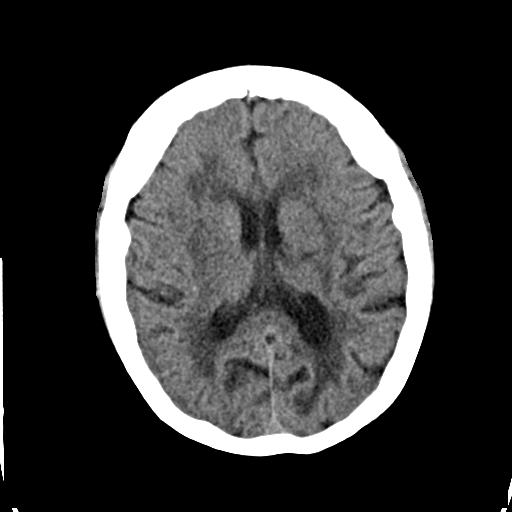
[im 17/31  bone]
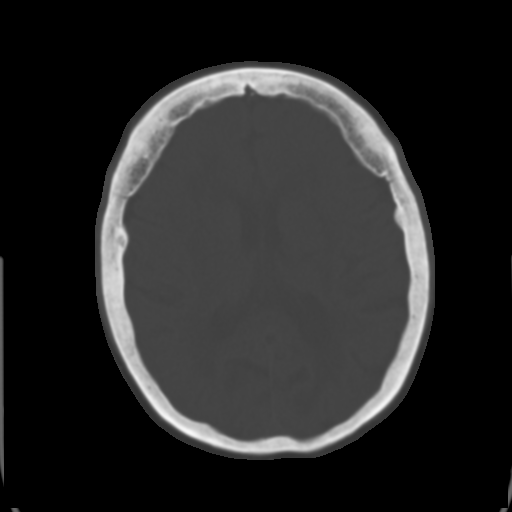
[im 21/31  brain]
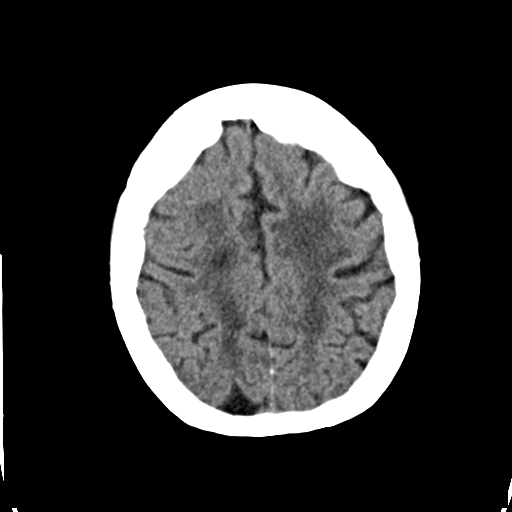
[im 24/31  brain]
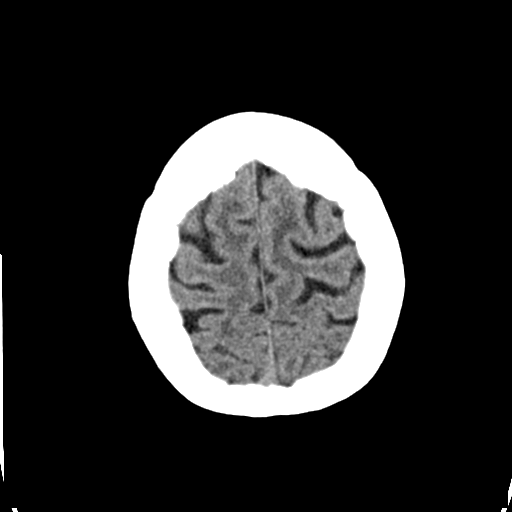
[im 28/31  brain]
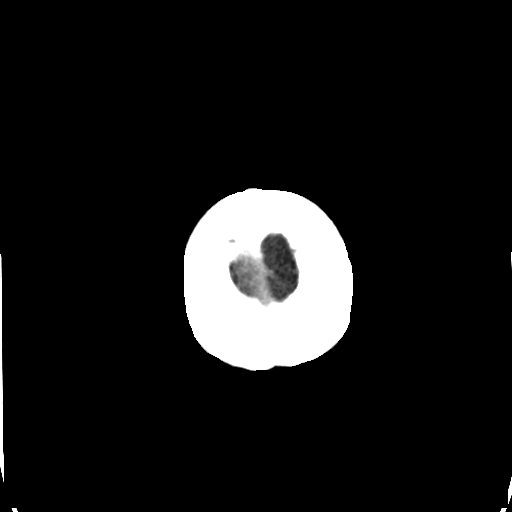

[Series 4: head 3.0 mpr cor · coronal · 0.30mm/px · 3 of 69 slices shown]
[im 23/69  brain]
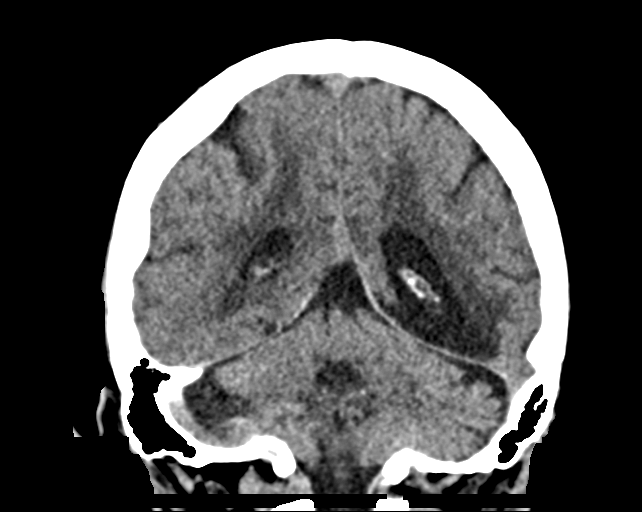
[im 31/69  brain]
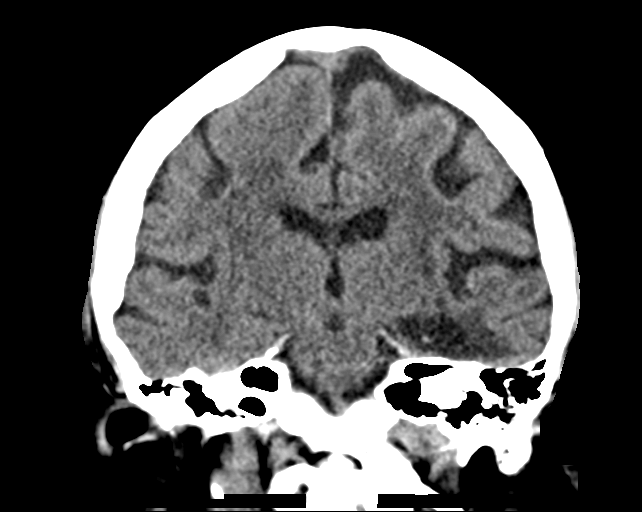
[im 38/69  brain]
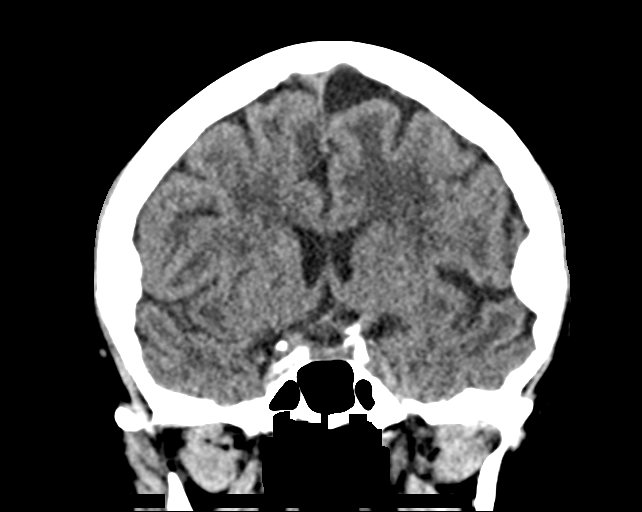

[Series 5: head 3.0 mpr sag · sagittal · 0.30mm/px · 3 of 67 slices shown]
[im 23/67  brain]
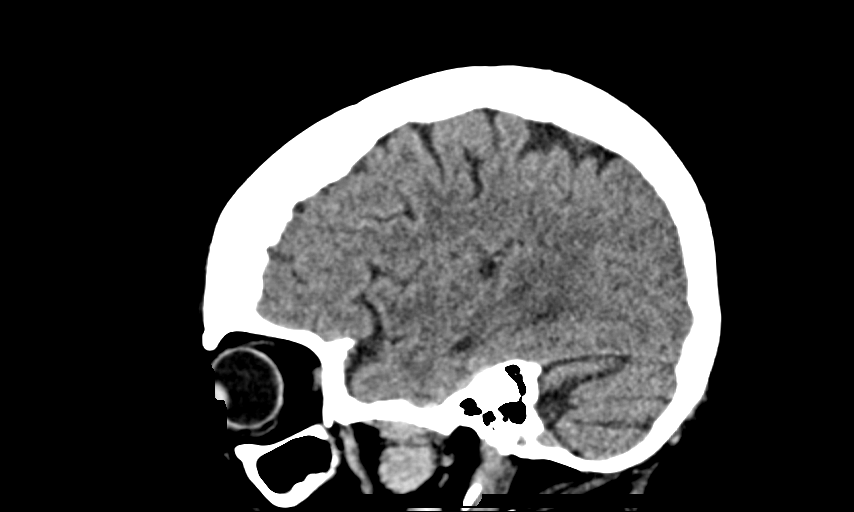
[im 34/67  brain]
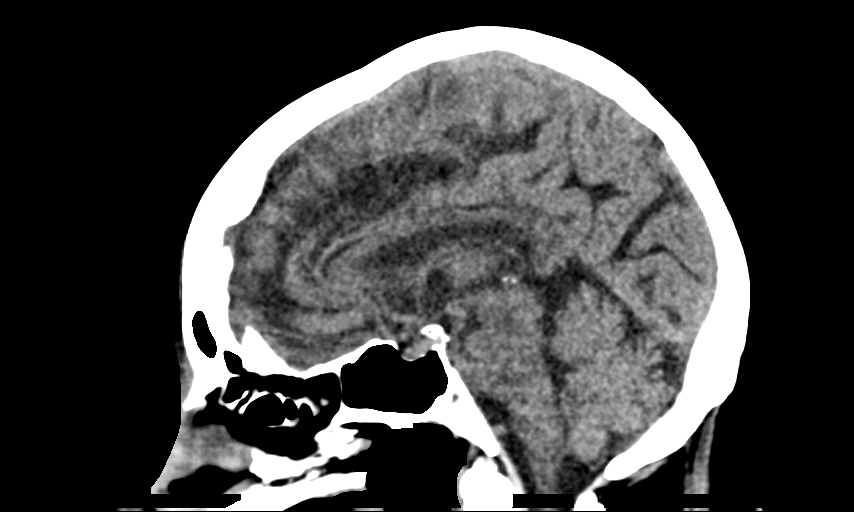
[im 45/67  brain]
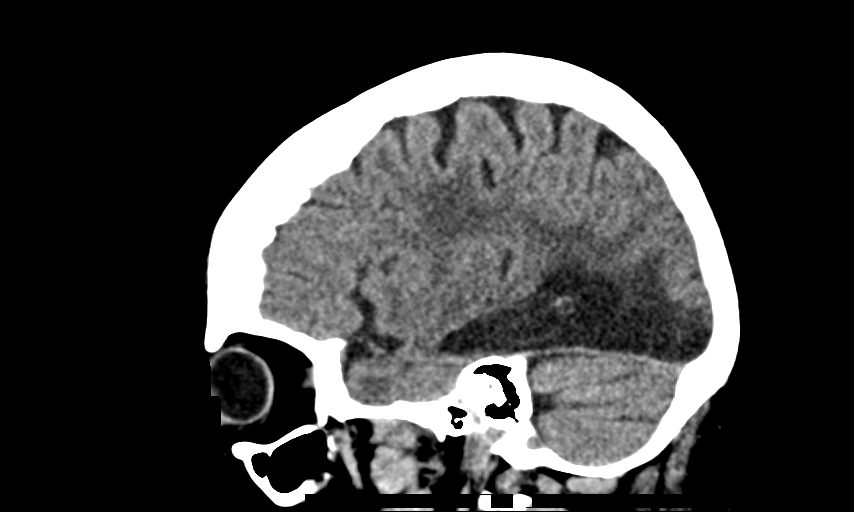

[14 of 47 positions shown; findings below may reference images not displayed]

FINDINGS: Brain: No hemorrhage. Expected evolution of left occipital infarct
from prior exam with moderate area of encephalomalacia.
Periventricular chronic small vessel ischemia seen. There is no
hydrocephalus, midline shift or mass effect. No subdural or
extra-axial collection. Senescent basal gangliar calcifications.

Vascular: Atherosclerosis of skullbase vasculature without
hyperdense vessel or abnormal calcification.

Skull: No fracture or focal lesion.

Sinuses/Orbits: Minor mucosal thickening of ethmoid air cells.
Mucous retention cysts in both maxillary sinuses. There is a right
nasal bone fracture that was not seen on prior. Mastoid air cells
are clear.

Other: Scalp hematoma and laceration just to the right of midline in
the low frontal region.
IMPRESSION: 1. Scalp hematoma and laceration just to the right of midline in the
low frontal region. No acute intracranial abnormality. No skull
fracture.
2. Remote left occipital infarct. Chronic small vessel ischemia.
3. Right nasal bone fracture, not seen on prior exam, age
indeterminate.

## 2021-08-23 ENCOUNTER — Ambulatory Visit: Payer: Self-pay

## 2021-08-23 ENCOUNTER — Ambulatory Visit: Payer: Medicare HMO | Admitting: Orthopedic Surgery

## 2021-08-23 DIAGNOSIS — M25511 Pain in right shoulder: Secondary | ICD-10-CM | POA: Diagnosis not present

## 2021-08-23 DIAGNOSIS — M4802 Spinal stenosis, cervical region: Secondary | ICD-10-CM

## 2021-08-23 DIAGNOSIS — G8929 Other chronic pain: Secondary | ICD-10-CM | POA: Diagnosis not present

## 2021-08-23 DIAGNOSIS — M19011 Primary osteoarthritis, right shoulder: Secondary | ICD-10-CM

## 2021-08-27 ENCOUNTER — Encounter: Payer: Self-pay | Admitting: Orthopedic Surgery

## 2021-08-27 MED ORDER — METHYLPREDNISOLONE ACETATE 40 MG/ML IJ SUSP
40.0000 mg | INTRAMUSCULAR | Status: AC | PRN
  Administered 2021-08-23: 40 mg via INTRA_ARTICULAR

## 2021-08-27 MED ORDER — LIDOCAINE HCL 1 % IJ SOLN
5.0000 mL | INTRAMUSCULAR | Status: AC | PRN
  Administered 2021-08-23: 5 mL

## 2021-08-27 MED ORDER — BUPIVACAINE HCL 0.5 % IJ SOLN
9.0000 mL | INTRAMUSCULAR | Status: AC | PRN
  Administered 2021-08-23: 9 mL via INTRA_ARTICULAR

## 2021-08-27 NOTE — Progress Notes (Signed)
Office Visit Note   Patient: Lorraine Page           Date of Birth: 02/28/1932           MRN: [id number] Visit Date: 08/23/2021 Requested by: Caesar Bookman, NP [address] [place],  [place] [address] PCP: Caesar Bookman, NP  Subjective: Chief Complaint  Patient presents with   Right Shoulder - Pain    HPI: Lorraine Page is a 88 y.o. female who presents to the office complaining of right arm pain.  Patient has history of right shoulder arthritis with rotator cuff arthropathy.  Reports severe pain that she localizes to the lateral shoulder with radiation down her arm.  She has numbness and tingling in all 5 fingers.  Denies any neck pain.  She has no history of prior neck ESI.  No history of neck surgery or physical therapy.  Does have a history of stroke and takes Eliquis.  She cannot really come off the Eliquis as the last time she was off her blood thinner for about 3 days she had another stroke.  Her cardiologist is Dr. Jens Som.  She reports that the last injection she had in her right shoulder did not give her much relief..                ROS: All systems reviewed are negative as they relate to the chief complaint within the history of present illness.  Patient denies fevers or chills.  Assessment & Plan: Visit Diagnoses:  1. Glenohumeral arthritis, right   2. Chronic right shoulder pain   3. Foraminal stenosis of cervical region     Plan: Patient is a 88 year old female who presents to the office today accompanied by her daughter.  She has right shoulder pain with radiation down the entirety of her right arm and associated numbness and tingling in all 5 fingers of her right hand.  No neck pain but she has not really had much relief from prior right shoulder injection.  She does have history of rotator cuff arthropathy of the right shoulder.  However, with lack of response to the right shoulder injection, discussed the possibility of cervical spine work-up and cervical spine  ESI with Nicole Cella today.  Unfortunately the last time she came off her Eliquis for 3 days, she had another stroke so this does not seem feasible.  She does appear to have severe disc degeneration at C5-C6 with what appears to be moderate foraminal stenosis at C5-C6 on the right-hand side.  Suspect that this may be contributing to a lot of her symptoms but options are fairly limited.  After lengthy discussion, patient would like to try 1 more right shoulder injection to see if this will give her some relief.  Follow-up with the office as needed.  Also offered physical therapy for her neck; they will pursue physical therapy exercises with a physical therapist who is in their family.  Follow-Up Instructions: No follow-ups on file.   Orders:  Orders Placed This Encounter  Procedures   XR Shoulder Right   No orders of the defined types were placed in this encounter.     Procedures: Large Joint Inj: R glenohumeral on 08/23/2021 6:28 PM Indications: diagnostic evaluation and pain Details: 18 G 1.5 in needle, posterior approach  Arthrogram: No  Medications: 9 mL bupivacaine 0.5 %; 40 mg methylPREDNISolone acetate 40 MG/ML; 5 mL lidocaine 1 % Outcome: tolerated well, no immediate complications Procedure, treatment alternatives, risks and benefits  explained, specific risks discussed. Consent was given by the patient. Immediately prior to procedure a time out was called to verify the correct patient, procedure, equipment, support staff and site/side marked as required. Patient was prepped and draped in the usual sterile fashion.       Clinical Data: No additional findings.  Objective: Vital Signs: There were no vitals taken for this visit.  Physical Exam:  Constitutional: Patient appears well-developed HEENT:  Head: Normocephalic Eyes:EOM are normal Neck: Normal range of motion Cardiovascular: Normal rate Pulmonary/chest: Effort normal Neurologic: Patient is alert Skin: Skin is  warm Psychiatric: Patient has normal mood and affect  Ortho Exam: Ortho exam demonstrates right shoulder with stiffness with passive range of motion.  Active range of motion equivalent to passive range of motion.  She can get her arm overhead but it is painful.  She has decent strength of supra and subscap rated 5/5 with weakness of interest Natus rated 4/5.  There is coarseness with passive motion of the right shoulder.  She does not really have tenderness throughout the axial cervical spine but she does have stiffness with range of motion of her neck.  Negative Lhermitte sign.  Negative Spurling sign.  5/5 motor strength of bilateral grip strength, finger abduction, pronation/supination, bicep, tricep, deltoid.    Specialty Comments:  No specialty comments available.  Imaging: No results found.   PMFS History: Patient Active Problem List   Diagnosis Date Noted   Pulmonary hypertension, moderate to severe (HCC) 02/11/2019   Permanent atrial fibrillation (HCC) 12/16/2018   Chronic anticoagulation 12/16/2018   H/O: CVA (cerebrovascular accident) 12/16/2018   CRI (chronic renal insufficiency), stage 3 (moderate) (HCC) 12/16/2018   Essential hypertension 12/16/2018   GERD (gastroesophageal reflux disease) 12/16/2018   Shortness of breath 12/16/2018   Status post total replacement of right hip 08/12/2018   Degenerative joint disease (DJD) of hip 03/19/2018   Past Medical History:  Diagnosis Date   Atrial fibrillation (HCC)    Chronic kidney disease, stage 3 (HCC)    CVA (cerebral vascular accident) (HCC)    GERD (gastroesophageal reflux disease)    GERD (gastroesophageal reflux disease)    Hyperlipidemia    Hypertension    Osteoarthritis    Prediabetes     Family History  Problem Relation Age of Onset   Cancer Mother    Stomach cancer Mother    CAD Father    Pneumonia Sister    Breast cancer Sister    Aortic stenosis Sister    Congestive Heart Failure Sister    Gastric  cancer Brother     Past Surgical History:  Procedure Laterality Date   carpel tunnel Right    CHOLECYSTECTOMY     RHINOPLASTY     TOTAL HIP ARTHROPLASTY Right 08/12/2018   Procedure: RIGHT TOTAL HIP ARTHROPLASTY ANTERIOR APPROACH;  Surgeon: Kathryne Hitch, MD;  Location: [facility];  Service: Orthopedics;  Laterality: Right;   Social History   Occupational History   Not on file  Tobacco Use   Smoking status: Former   Smokeless tobacco: Never  Vaping Use   Vaping Use: Never used  Substance and Sexual Activity   Alcohol use: Not Currently   Drug use: Never   Sexual activity: Not on file

## 2021-09-04 ENCOUNTER — Other Ambulatory Visit: Payer: Self-pay | Admitting: Cardiology

## 2021-09-04 DIAGNOSIS — I10 Essential (primary) hypertension: Secondary | ICD-10-CM

## 2021-09-04 DIAGNOSIS — R0602 Shortness of breath: Secondary | ICD-10-CM

## 2021-09-27 ENCOUNTER — Encounter: Payer: Self-pay | Admitting: Family

## 2021-09-27 ENCOUNTER — Ambulatory Visit (INDEPENDENT_AMBULATORY_CARE_PROVIDER_SITE_OTHER): Payer: Medicare HMO | Admitting: Family

## 2021-09-27 DIAGNOSIS — R2681 Unsteadiness on feet: Secondary | ICD-10-CM

## 2021-09-27 DIAGNOSIS — I10 Essential (primary) hypertension: Secondary | ICD-10-CM | POA: Diagnosis not present

## 2021-09-27 DIAGNOSIS — Z8673 Personal history of transient ischemic attack (TIA), and cerebral infarction without residual deficits: Secondary | ICD-10-CM | POA: Diagnosis not present

## 2021-09-27 DIAGNOSIS — R7303 Prediabetes: Secondary | ICD-10-CM | POA: Diagnosis not present

## 2021-09-27 DIAGNOSIS — I4821 Permanent atrial fibrillation: Secondary | ICD-10-CM | POA: Diagnosis not present

## 2021-09-27 NOTE — Progress Notes (Signed)
Provider: Marlowe Sax FNP-C   Eileen Kangas, Nelda Bucks, NP  Patient Care Team: Dilyn Osoria, Nelda Bucks, NP as PCP - General (Family Medicine) Lelon Perla, MD as PCP - Cardiology (Cardiology) Mcarthur Rossetti, MD as Consulting Physician (Orthopedic Surgery) Roel Cluck, MD as Referring Physician (Ophthalmology) Stanford Breed Denice Bors, MD as Consulting Physician (Cardiology)  Extended Emergency Contact Information Primary Emergency Contact: Lingafelter,Catherine Mobile Phone: [phone] Relation: Daughter Interpreter needed? No Secondary Emergency Contact: [place] Mobile Phone: [phone] Relation: Son Interpreter needed? No  Code Status: Full code Goals of care: Advanced Directive information    03/27/2021    2:14 PM  Advanced Directives  Does Patient Have a Medical Advance Directive? Yes  Type of Paramedic of [place];Living will;Out of facility DNR (pink MOST or yellow form)  Copy of [place] in Chart? No - copy requested     Chief Complaint  Patient presents with   Medical Management of Chronic Issues    Patient is here for a follow up visit    HPI:  Pt is a 88 y.o. female seen today for 6 months follow up for medical management of chronic diseases.  Has some medical history of hypertension, A-fib, history of CVA, osteoarthritis, prediabetes, GERD among others. State following up with orthopedic for shoulder pain No weight changes since last visit  Past Medical History:  Diagnosis Date   Atrial fibrillation ([place])    Chronic kidney disease, stage 3 (HCC)    CVA (cerebral vascular accident) ([place])    GERD (gastroesophageal reflux disease)    GERD (gastroesophageal reflux disease)    Hyperlipidemia    Hypertension    Osteoarthritis    Prediabetes    Past Surgical History:  Procedure Laterality Date   carpel tunnel Right    CHOLECYSTECTOMY     RHINOPLASTY     TOTAL HIP ARTHROPLASTY Right 08/12/2018    Procedure: RIGHT TOTAL HIP ARTHROPLASTY ANTERIOR APPROACH;  Surgeon: Mcarthur Rossetti, MD;  Location: [facility];  Service: Orthopedics;  Laterality: Right;    Allergies  Allergen Reactions   Penicillins Anaphylaxis and Swelling    Local swelling, throat swelling    Sulfa Antibiotics Anaphylaxis and Hives        Beta Adrenergic Blockers Other (See Comments)    bradycardia    Allergies as of 09/27/2021       Reactions   Penicillins Anaphylaxis, Swelling   Local swelling, throat swelling   Sulfa Antibiotics Anaphylaxis, Hives      Beta Adrenergic Blockers Other (See Comments)   bradycardia        Medication List        Accurate as of September 27, 2021  3:12 PM. If you have any questions, ask your nurse or doctor.          acetaminophen 650 MG CR tablet Commonly known as: TYLENOL Take 1,300 mg by mouth 2 (two) times daily.   apixaban 2.5 MG Tabs tablet Commonly known as: Eliquis Take 1 tablet (2.5 mg total) by mouth 2 (two) times daily.   CALCIUM 600+D3 PO Take 1 tablet by mouth daily.   cimetidine 200 MG tablet Commonly known as: TAGAMET Take 200 mg by mouth daily.   escitalopram 20 MG tablet Commonly known as: LEXAPRO TAKE 1 TABLET BY MOUTH EVERY EVENING   furosemide 20 MG tablet Commonly known as: LASIX TAKE 2 TABLETS (40 MG) BY MOUTH DAILY   gabapentin 100 MG capsule Commonly known as: NEURONTIN Take 100  mg by mouth daily.   hydrALAZINE 100 MG tablet Commonly known as: APRESOLINE TAKE 1 TABLET BY MOUTH 3 TIMES DAILY   losartan 100 MG tablet Commonly known as: COZAAR TAKE 1 TABLET BY MOUTH EVERY DAY   multivitamin tablet Take 1 tablet by mouth daily.   PROBIOTIC DAILY PO Take 1 capsule by mouth daily.   rosuvastatin 10 MG tablet Commonly known as: CRESTOR TAKE 1/2 TABLET BY MOUTH TWICE PER WEEK FOR CHOLESTEROL        Review of Systems  Constitutional:  Negative for appetite change, chills, fatigue, fever and unexpected weight  change.  HENT:  Negative for congestion, dental problem, ear discharge, ear pain, facial swelling, hearing loss, nosebleeds, postnasal drip, rhinorrhea, sinus pressure, sinus pain, sneezing, sore throat, tinnitus and trouble swallowing.   Eyes:  Negative for pain, discharge, redness, itching and visual disturbance.  Respiratory:  Negative for cough, chest tightness, shortness of breath and wheezing.   Cardiovascular:  Negative for chest pain, palpitations and leg swelling.  Gastrointestinal:  Negative for abdominal distention, abdominal pain, blood in stool, constipation, diarrhea, nausea and vomiting.  Endocrine: Negative for cold intolerance, heat intolerance, polydipsia, polyphagia and polyuria.  Genitourinary:  Negative for difficulty urinating, dysuria, flank pain, frequency and urgency.  Musculoskeletal:  Negative for arthralgias, back pain, gait problem, joint swelling, myalgias, neck pain and neck stiffness.  Skin:  Negative for color change, pallor, rash and wound.  Neurological:  Negative for dizziness, syncope, speech difficulty, weakness, light-headedness, numbness and headaches.  Hematological:  Does not bruise/bleed easily.  Psychiatric/Behavioral:  Negative for agitation, behavioral problems, confusion, hallucinations, self-injury, sleep disturbance and suicidal ideas. The patient is not nervous/anxious.     Immunization History  Administered Date(s) Administered   Influenza, High Dose Seasonal PF 11/23/2015, 12/10/2016, 11/15/2017   Influenza-Unspecified 11/07/2018, 10/09/2019, 12/11/2020   PFIZER(Purple Top)SARS-COV-2 Vaccination 02/25/2019, 03/18/2019, 12/17/2019   Pfizer Covid-19 Vaccine Bivalent Booster 70yrs & up 12/11/2020   Pneumococcal Conjugate-13 11/17/2014   Pneumococcal Polysaccharide-23 05/19/2003   Tdap 12/14/2010, 08/24/2020   Zoster, Live 10/09/2005   Pertinent  Health Maintenance Due  Topic Date Due   INFLUENZA VACCINE  09/05/2021   DEXA SCAN  Completed       08/13/2018   10:46 PM 08/15/2018    3:37 AM 08/24/2020    7:39 PM 09/02/2020    4:36 PM 03/27/2021    2:18 PM  Fall Risk  Falls in the past year?    1 0  Was there an injury with Fall?    1 0  Fall Risk Category Calculator    3 0  Fall Risk Category    High Low  Patient Fall Risk Level High fall risk High fall risk High fall risk High fall risk Low fall risk  Patient at Risk for Falls Due to    History of fall(s) No Fall Risks  Fall risk Follow up    Falls evaluation completed Falls evaluation completed   Functional Status Survey:    Vitals:   09/27/21 1455  BP: (!) 150/82  Pulse: 74  Resp: 18  Temp: 97.6 F (36.4 C)  TempSrc: Temporal  SpO2: 98%  Weight: 137 lb (62.1 kg)  Height: 4\' 10"  (1.473 m)   Body mass index is 28.63 kg/m. Physical Exam Vitals reviewed.  Constitutional:      General: She is not in acute distress.    Appearance: Normal appearance. She is overweight. She is not ill-appearing or diaphoretic.  HENT:  Head: Normocephalic.     Right Ear: Tympanic membrane, ear canal and external ear normal. There is no impacted cerumen.     Left Ear: Tympanic membrane, ear canal and external ear normal. There is no impacted cerumen.     Nose: Nose normal. No congestion or rhinorrhea.     Mouth/Throat:     Mouth: Mucous membranes are moist.     Pharynx: Oropharynx is clear. No oropharyngeal exudate or posterior oropharyngeal erythema.  Eyes:     General: No scleral icterus.       Right eye: No discharge.        Left eye: No discharge.     Extraocular Movements: Extraocular movements intact.     Conjunctiva/sclera: Conjunctivae normal.     Pupils: Pupils are equal, round, and reactive to light.  Neck:     Vascular: No carotid bruit.  Cardiovascular:     Rate and Rhythm: Normal rate and regular rhythm.     Pulses: Normal pulses.     Heart sounds: Normal heart sounds. No murmur heard.    No friction rub. No gallop.  Pulmonary:     Effort: Pulmonary effort  is normal. No respiratory distress.     Breath sounds: Normal breath sounds. No wheezing, rhonchi or rales.  Chest:     Chest wall: No tenderness.  Abdominal:     General: Bowel sounds are normal. There is no distension.     Palpations: Abdomen is soft. There is no mass.     Tenderness: There is no abdominal tenderness. There is no right CVA tenderness, left CVA tenderness, guarding or rebound.  Musculoskeletal:        General: No swelling or tenderness. Normal range of motion.     Cervical back: Normal range of motion. No rigidity or tenderness.     Right lower leg: No edema.     Left lower leg: No edema.  Lymphadenopathy:     Cervical: No cervical adenopathy.  Skin:    General: Skin is warm and dry.     Coloration: Skin is not pale.     Findings: No bruising, erythema, lesion or rash.  Neurological:     Mental Status: She is alert and oriented to person, place, and time.     Cranial Nerves: No cranial nerve deficit.     Sensory: No sensory deficit.     Motor: No weakness.     Coordination: Coordination normal.     Gait: Gait normal.  Psychiatric:        Mood and Affect: Mood normal.        Speech: Speech normal.        Behavior: Behavior normal.        Thought Content: Thought content normal.        Judgment: Judgment normal.     Labs reviewed: Recent Labs    05/19/21 0921  NA 142  K 4.3  CL 103  CO2 24  GLUCOSE 92  BUN 9  CREATININE 1.35*  CALCIUM 7.8*   Recent Labs    05/19/21 0921  AST 26  ALT 15  ALKPHOS 45  BILITOT 0.3  PROT 6.8  ALBUMIN 4.5   Recent Labs    05/19/21 0921  WBC 4.5  HGB 11.8  HCT 36.2  MCV 96  PLT 207   Lab Results  Component Value Date   TSH 4.400 05/19/2021   No results found for: "HGBA1C" Lab Results  Component Value Date   CHOL  167 05/19/2021   HDL 39 (L) 05/19/2021   LDLCALC 96 05/19/2021   TRIG 185 (H) 05/19/2021   CHOLHDL 4.3 05/19/2021    Significant Diagnostic Results in last 30 days:  No results  found.  Assessment/Plan 1. Essential hypertension Blood pressure stable -Continue on losartan and hydralazine And rosuvastatin and anticoagulant cardiovascular event prevention - CBC with Differential/Platelets; Future - CMP; Future - TSH; Future - Lipid Panel; Future  2. Permanent atrial fibrillation (HCC) Heart rate control -Continue on apixaban for anticoagulation  3. Unsteady gait Fall and safety precaution advised  4. H/O: CVA (cerebrovascular accident) No residual -Continue on rosuvastatin and apixaban -Continue to control high risk factors  5. Prediabetes We will recheck lab work Advised to cut down on carbohydrates and sugary foods/drinks - CBC with Differential/Platelets; Future - CMP; Future - TSH; Future - Lipid Panel; Future  Family/ staff Communication: Reviewed plan of care with patient and daughter verbalized understanding  Labs/tests ordered:  - CBC with Differential/Platelets; Future - CMP; Future - TSH; Future - Lipid Panel; Future  Next Appointment : Return in about 6 months (around 03/30/2022) for medical mangement of chronic issues., Fasting labs in 6 months prior to visit.   Caesar Bookman, NP

## 2021-10-04 NOTE — Progress Notes (Signed)
HPI: FU atrial fibrillation.  Patient was off of Eliquis for hip injection in January 2019 and suffered an embolic CVA. She has some gait instability from previous CVA.  Monitor December 2020 showed sinus bradycardia with occasional PAC, PVC, junctional rhythm and occasional pauses with longest being 4.7 seconds.  Metoprolol discontinued.  Patient seen at [facility] with shortness of breath.  Chest x-ray showed no active disease, troponin normal, hemoglobin 11.1, BNP elevated at 419, BUN 24 and creatinine 1.58.  Patient felt to have acute on chronic diastolic congestive heart failure.  Echocardiogram repeated July 2023 and showed normal LV function, severe left atrial enlargement, mild mitral regurgitation.  Patient given Lasix with some improvement.  Since last seen, she has not had recurrent dyspnea.  No chest pain, palpitations, syncope, pedal edema or bleeding.  She has not fallen.  Current Outpatient Medications  Medication Sig Dispense Refill   acetaminophen (TYLENOL) 650 MG CR tablet Take 1,300 mg by mouth 2 (two) times daily.      apixaban (ELIQUIS) 2.5 MG TABS tablet Take 1 tablet (2.5 mg total) by mouth 2 (two) times daily. 180 tablet 3   Calcium Carb-Cholecalciferol (CALCIUM 600+D3 PO) Take 1 tablet by mouth daily.     cimetidine (TAGAMET) 200 MG tablet Take 200 mg by mouth daily.      escitalopram (LEXAPRO) 20 MG tablet TAKE 1 TABLET BY MOUTH EVERY EVENING 90 tablet 3   furosemide (LASIX) 20 MG tablet TAKE 2 TABLETS (40 MG) BY MOUTH DAILY 180 tablet 3   gabapentin (NEURONTIN) 100 MG capsule Take 100 mg by mouth daily.     hydrALAZINE (APRESOLINE) 100 MG tablet TAKE 1 TABLET BY MOUTH 3 TIMES DAILY 90 tablet 3   losartan (COZAAR) 100 MG tablet TAKE 1 TABLET BY MOUTH EVERY DAY 90 tablet 3   Multiple Vitamin (MULTIVITAMIN) tablet Take 1 tablet by mouth daily.     Probiotic Product (PROBIOTIC DAILY PO) Take 1 capsule by mouth daily.      rosuvastatin (CRESTOR) 10 MG tablet TAKE  1/2 TABLET BY MOUTH TWICE PER WEEK FOR CHOLESTEROL 30 tablet 3   No current facility-administered medications for this visit.     Past Medical History:  Diagnosis Date   Atrial fibrillation ([place])    Chronic kidney disease, stage 3 (H[facility])    CVA (cerebral vascular accident) ([facility])    GERD (gastroesophageal reflux disease)    GERD (gastroesophageal reflux disease)    Hyperlipidemia    Hypertension    Osteoarthritis    Prediabetes     Past Surgical History:  Procedure Laterality Date   carpel tunnel Right    CHOLECYSTECTOMY     RHINOPLASTY     TOTAL HIP ARTHROPLASTY Right 08/12/2018   Procedure: RIGHT TOTAL HIP ARTHROPLASTY ANTERIOR APPROACH;  Surgeon: Mcarthur Rossetti, MD;  Location: [facility];  Service: Orthopedics;  Laterality: Right;    Social History   Socioeconomic History   Marital status: Widowed    Spouse name: Not on file   Number of children: 3   Years of education: Not on file   Highest education level: Not on file  Occupational History   Not on file  Tobacco Use   Smoking status: Former   Smokeless tobacco: Never  Vaping Use   Vaping Use: Never used  Substance and Sexual Activity   Alcohol use: Not Currently   Drug use: Never   Sexual activity: Not on file  Other Topics Concern  Not on file  Social History Narrative   Tobacco use, amount per day now: None   Past tobacco use, amount per day:    How many years did you use tobacco: Quit 1979 ( maybe 20 years )   Alcohol use (drinks per week): None   Diet:   Do you drink/eat things with caffeine: Coffee   Marital status:    Widowed                              What year were you married? 49 years married in 1965   Do you live in a house, apartment, assisted living, condo, trailer, etc.? House   Is it one or more stories? 1   How many persons live in your home? 1   Do you have pets in your home?( please list) None.   Highest Level of education completed? High School 12th grade.   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   Do you exercise?    None                              Type and how often?   Do you have a living will? Yes   Do you have a DNR form?    No                               If not, do you want to discuss one?   Do you have signed POA/HPOA forms?  Yes                      If so, please bring to you appointment      Do you have any difficulty bathing or dressing yourself? No   Do you have any difficulty preparing food or eating? No   Do you have any difficulty managing your medications? No   Do you have any difficulty managing your finances? Yes   Do you have any difficulty affording your medications? Yes   Social Determinants of Health   Financial Resource Strain: Not on file  Food Insecurity: Not on file  Transportation Needs: Not on file  Physical Activity: Not on file  Stress: Not on file  Social Connections: Not on file  Intimate Partner Violence: Not on file    Family History  Problem Relation Age of Onset   Cancer Mother    Stomach cancer Mother    CAD Father    Pneumonia Sister    Breast cancer Sister    Aortic stenosis Sister    Congestive Heart Failure Sister    Gastric cancer Brother     ROS: no fevers or chills, productive cough, hemoptysis, dysphasia, odynophagia, melena, hematochezia, dysuria, hematuria, rash, seizure activity, orthopnea, PND, pedal edema, claudication. Remaining systems are negative.  Physical Exam: Well-developed well-nourished in no acute distress.  Skin is warm and dry.  HEENT is normal.  Neck is supple.  Chest is clear to auscultation with normal expansion.  Cardiovascular exam is regular.  2/6 systolic murmur left sternal border. Abdominal exam nontender or distended. No masses palpated. Extremities show no edema. neuro grossly intact   A/P  1 chronic diastolic congestive heart failure-patient appears to be euvolemic today.  Continue Lasix at present dose.  She will take an  additional  40 mg of Lasix for weight gain of 2 to 3 pounds.  We discussed the importance of fluid restriction and low-sodium diet.    2 paroxysmal atrial fibrillation-patient remains in sinus rhythm today.  Continue apixaban at present dose.  Note beta-blocker was discontinued previously due to bradycardia.  3 hypertension-patient's blood pressure is controlled.  Continue present medical regimen and follow.  4 hyperlipidemia-continue statin.  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  Continue diuretic as outlined.  Olga Millers, MD

## 2021-10-18 ENCOUNTER — Encounter: Payer: Self-pay | Admitting: Cardiology

## 2021-10-18 ENCOUNTER — Ambulatory Visit: Payer: Medicare HMO | Attending: Cardiology | Admitting: Cardiology

## 2021-10-18 VITALS — BP 134/48 | HR 71 | Ht 59.0 in | Wt 138.0 lb

## 2021-10-18 DIAGNOSIS — E7800 Pure hypercholesterolemia, unspecified: Secondary | ICD-10-CM

## 2021-10-18 DIAGNOSIS — I480 Paroxysmal atrial fibrillation: Secondary | ICD-10-CM

## 2021-10-18 DIAGNOSIS — I10 Essential (primary) hypertension: Secondary | ICD-10-CM

## 2021-10-18 DIAGNOSIS — I5032 Chronic diastolic (congestive) heart failure: Secondary | ICD-10-CM | POA: Diagnosis not present

## 2021-10-18 NOTE — Patient Instructions (Signed)
  Follow-Up: At [facility], you and your health needs are our priority.  As part of our continuing mission to provide you with exceptional heart care, we have created designated Provider Care Teams.  These Care Teams include your primary Cardiologist (physician) and Advanced Practice Providers (APPs -  Physician Assistants and Nurse Practitioners) who all work together to provide you with the care you need, when you need it.  We recommend signing up for the patient portal called "MyChart".  Sign up information is provided on this After Visit Summary.  MyChart is used to connect with patients for Virtual Visits (Telemedicine).  Patients are able to view lab/test results, encounter notes, upcoming appointments, etc.  Non-urgent messages can be sent to your provider as well.   To learn more about what you can do with MyChart, go to [email or web address].    Your next appointment:   6 month(s)  The format for your next appointment:   In Person  Provider:   Brian Crenshaw, MD    

## 2021-10-23 ENCOUNTER — Encounter: Payer: Self-pay | Admitting: Cardiology

## 2021-11-08 ENCOUNTER — Other Ambulatory Visit: Payer: Self-pay | Admitting: Family

## 2021-11-08 NOTE — Telephone Encounter (Signed)
Patient has request refill on medication Gabapentin 100mg . Patient hasn't had this medication refilled by PCP Ngetich, Dinah C, NP . Medication pend and sent for approval.

## 2021-11-17 ENCOUNTER — Emergency Department (HOSPITAL_BASED_OUTPATIENT_CLINIC_OR_DEPARTMENT_OTHER): Payer: Medicare HMO

## 2021-11-17 ENCOUNTER — Observation Stay (HOSPITAL_BASED_OUTPATIENT_CLINIC_OR_DEPARTMENT_OTHER)
Admission: EM | Admit: 2021-11-17 | Discharge: 2021-11-18 | Disposition: A | Discharge status: Home / Self Care | Payer: Medicare HMO | Attending: Internal Medicine | Admitting: Internal Medicine

## 2021-11-17 ENCOUNTER — Encounter (HOSPITAL_BASED_OUTPATIENT_CLINIC_OR_DEPARTMENT_OTHER): Payer: Self-pay | Admitting: Emergency Medicine

## 2021-11-17 ENCOUNTER — Other Ambulatory Visit: Payer: Self-pay

## 2021-11-17 DIAGNOSIS — I4821 Permanent atrial fibrillation: Secondary | ICD-10-CM | POA: Diagnosis not present

## 2021-11-17 DIAGNOSIS — Z8673 Personal history of transient ischemic attack (TIA), and cerebral infarction without residual deficits: Secondary | ICD-10-CM | POA: Insufficient documentation

## 2021-11-17 DIAGNOSIS — R1084 Generalized abdominal pain: Secondary | ICD-10-CM

## 2021-11-17 DIAGNOSIS — N289 Disorder of kidney and ureter, unspecified: Secondary | ICD-10-CM | POA: Diagnosis not present

## 2021-11-17 DIAGNOSIS — R109 Unspecified abdominal pain: Principal | ICD-10-CM | POA: Diagnosis present

## 2021-11-17 DIAGNOSIS — I129 Hypertensive chronic kidney disease with stage 1 through stage 4 chronic kidney disease, or unspecified chronic kidney disease: Secondary | ICD-10-CM | POA: Diagnosis not present

## 2021-11-17 DIAGNOSIS — N1832 Chronic kidney disease, stage 3b: Secondary | ICD-10-CM | POA: Diagnosis present

## 2021-11-17 DIAGNOSIS — N183 Chronic kidney disease, stage 3 unspecified: Secondary | ICD-10-CM | POA: Insufficient documentation

## 2021-11-17 DIAGNOSIS — I272 Pulmonary hypertension, unspecified: Secondary | ICD-10-CM | POA: Diagnosis present

## 2021-11-17 DIAGNOSIS — I4891 Unspecified atrial fibrillation: Secondary | ICD-10-CM | POA: Diagnosis present

## 2021-11-17 DIAGNOSIS — Z79899 Other long term (current) drug therapy: Secondary | ICD-10-CM | POA: Diagnosis not present

## 2021-11-17 DIAGNOSIS — Z87891 Personal history of nicotine dependence: Secondary | ICD-10-CM | POA: Diagnosis not present

## 2021-11-17 DIAGNOSIS — Z96641 Presence of right artificial hip joint: Secondary | ICD-10-CM | POA: Insufficient documentation

## 2021-11-17 DIAGNOSIS — I10 Essential (primary) hypertension: Secondary | ICD-10-CM | POA: Diagnosis present

## 2021-11-17 DIAGNOSIS — R519 Headache, unspecified: Secondary | ICD-10-CM | POA: Insufficient documentation

## 2021-11-17 DIAGNOSIS — I480 Paroxysmal atrial fibrillation: Secondary | ICD-10-CM | POA: Diagnosis present

## 2021-11-17 DIAGNOSIS — Z7901 Long term (current) use of anticoagulants: Secondary | ICD-10-CM | POA: Diagnosis not present

## 2021-11-17 DIAGNOSIS — R0602 Shortness of breath: Secondary | ICD-10-CM | POA: Diagnosis not present

## 2021-11-17 DIAGNOSIS — I63532 Cerebral infarction due to unspecified occlusion or stenosis of left posterior cerebral artery: Secondary | ICD-10-CM | POA: Diagnosis not present

## 2021-11-17 DIAGNOSIS — I700 Atherosclerosis of aorta: Secondary | ICD-10-CM | POA: Diagnosis not present

## 2021-11-17 LAB — CBC WITH DIFFERENTIAL/PLATELET
Abs Immature Granulocytes: 0.01 10*3/uL (ref 0.00–0.07)
Basophils Absolute: 0 10*3/uL (ref 0.0–0.1)
Basophils Relative: 1 %
Eosinophils Absolute: 0.1 10*3/uL (ref 0.0–0.5)
Eosinophils Relative: 1 %
HCT: 33 % — ABNORMAL LOW (ref 36.0–46.0)
Hemoglobin: 11.4 g/dL — ABNORMAL LOW (ref 12.0–15.0)
Immature Granulocytes: 0 %
Lymphocytes Relative: 25 %
Lymphs Abs: 1.1 10*3/uL (ref 0.7–4.0)
MCH: 31.3 pg (ref 26.0–34.0)
MCHC: 34.5 g/dL (ref 30.0–36.0)
MCV: 90.7 fL (ref 80.0–100.0)
Monocytes Absolute: 0.5 10*3/uL (ref 0.1–1.0)
Monocytes Relative: 12 %
Neutro Abs: 2.7 10*3/uL (ref 1.7–7.7)
Neutrophils Relative %: 61 %
Platelets: 221 10*3/uL (ref 150–400)
RBC: 3.64 MIL/uL — ABNORMAL LOW (ref 3.87–5.11)
RDW: 12.6 % (ref 11.5–15.5)
WBC: 4.4 10*3/uL (ref 4.0–10.5)
nRBC: 0 % (ref 0.0–0.2)

## 2021-11-17 LAB — COMPREHENSIVE METABOLIC PANEL
ALT: 20 U/L (ref 0–44)
AST: 33 U/L (ref 15–41)
Albumin: 4.2 g/dL (ref 3.5–5.0)
Alkaline Phosphatase: 43 U/L (ref 38–126)
Anion gap: 12 (ref 5–15)
BUN: 20 mg/dL (ref 8–23)
CO2: 18 mmol/L — ABNORMAL LOW (ref 22–32)
Calcium: 9 mg/dL (ref 8.9–10.3)
Chloride: 99 mmol/L (ref 98–111)
Creatinine, Ser: 1.45 mg/dL — ABNORMAL HIGH (ref 0.44–1.00)
GFR, Estimated: 35 mL/min — ABNORMAL LOW (ref >60)
Glucose, Bld: 101 mg/dL — ABNORMAL HIGH (ref 70–99)
Potassium: 3.9 mmol/L (ref 3.5–5.1)
Sodium: 129 mmol/L — ABNORMAL LOW (ref 135–145)
Total Bilirubin: 0.6 mg/dL (ref 0.3–1.2)
Total Protein: 7.6 g/dL (ref 6.5–8.1)

## 2021-11-17 LAB — URINALYSIS, MICROSCOPIC (REFLEX): RBC / HPF: NONE SEEN RBC/hpf (ref 0–5)

## 2021-11-17 LAB — URINALYSIS, ROUTINE W REFLEX MICROSCOPIC
Bilirubin Urine: NEGATIVE
Glucose, UA: NEGATIVE mg/dL
Hgb urine dipstick: NEGATIVE
Ketones, ur: NEGATIVE mg/dL
Nitrite: NEGATIVE
Protein, ur: NEGATIVE mg/dL
Specific Gravity, Urine: 1.01 (ref 1.005–1.030)
pH: 6.5 (ref 5.0–8.0)

## 2021-11-17 LAB — LIPASE, BLOOD: Lipase: 84 U/L — ABNORMAL HIGH (ref 11–51)

## 2021-11-17 LAB — MAGNESIUM: Magnesium: 1.9 mg/dL (ref 1.7–2.4)

## 2021-11-17 MED ORDER — HYDROMORPHONE HCL 1 MG/ML IJ SOLN
0.5000 mg | Freq: Once | INTRAMUSCULAR | Status: AC
  Administered 2021-11-17: 0.5 mg via INTRAVENOUS
  Filled 2021-11-17: qty 1

## 2021-11-17 MED ORDER — SODIUM CHLORIDE 0.9 % IV BOLUS
500.0000 mL | Freq: Once | INTRAVENOUS | Status: AC
  Administered 2021-11-17: 500 mL via INTRAVENOUS

## 2021-11-17 MED ORDER — HYDROMORPHONE HCL 1 MG/ML IJ SOLN
0.5000 mg | Freq: Once | INTRAMUSCULAR | Status: AC
  Administered 2021-11-18: 0.5 mg via INTRAVENOUS
  Filled 2021-11-17: qty 1

## 2021-11-17 MED ORDER — IOHEXOL 300 MG/ML  SOLN
80.0000 mL | Freq: Once | INTRAMUSCULAR | Status: AC | PRN
  Administered 2021-11-17: 80 mL via INTRAVENOUS

## 2021-11-17 NOTE — ED Notes (Signed)
Pt transported to CT ?

## 2021-11-17 NOTE — ED Notes (Signed)
Pt in CT.

## 2021-11-17 NOTE — ED Triage Notes (Signed)
Patient arrived via POV abdominal pain x 7 hr. Patient states hx of IBS, pain starting after breakfast this morning. Patient states 2 watery bowel movements this morning, none since. Patient states different feeling of numbness in arms and legs. Patient is 10/10 pain. Patient is AO x 4, VS w/ elevated BP, unable to walk at this time.

## 2021-11-17 NOTE — ED Provider Notes (Signed)
[facility] Provider Note   CSN: [id number] Arrival date & time: 11/17/21  2011     History  Chief Complaint  Patient presents with   Abdominal Pain    Lorraine Page is a 88 y.o. female.  HPI 88 year old female presents with a chief complaint of abdominal pain.  Started about 8 hours ago.  Is here with daughter who also provides history.  She is having abdominal pain and has a history of IBS but the patient is telling me that she is never had pain this bad in her abdomen.  Was given Gas-X but still having pain.  Has also had a couple episodes of diarrhea earlier in the day.  Over the last couple hours she is also developed a headache.  The abdominal pain is worse than the headache.  Has neuropathy and chronic right upper extremity numbness from previous stroke and all of these are worse.  She is having numbness and tingling in all 4 extremities, worse in the right upper.  Some dyspnea earlier, though family feels that was probably anxiety related and seems improved. No chest pain.  Family member is a cardiology PA who advised her to take an extra dose of Lasix, which she did.  Home Medications Prior to Admission medications   Medication Sig Start Date End Date Taking? Authorizing Provider  acetaminophen (TYLENOL) 650 MG CR tablet Take 1,300 mg by mouth 2 (two) times daily.     [provider]  apixaban (ELIQUIS) 2.5 MG TABS tablet Take 1 tablet (2.5 mg total) by mouth 2 (two) times daily. 05/22/21   Lelon Perla, MD  Calcium Carb-Cholecalciferol (CALCIUM 600+D3 PO) Take 1 tablet by mouth daily.    [provider]  cimetidine (TAGAMET) 200 MG tablet Take 200 mg by mouth daily.     [provider]  escitalopram (LEXAPRO) 20 MG tablet TAKE 1 TABLET BY MOUTH EVERY EVENING 08/22/21   Lelon Perla, MD  furosemide (LASIX) 20 MG tablet TAKE 2 TABLETS (40 MG) BY MOUTH DAILY 09/05/21   Lelon Perla, MD  gabapentin (NEURONTIN)  100 MG capsule TAKE 1 CAPSULE BY MOUTH THREE TIMES A DAY 11/08/21   Ngetich, Dinah C, NP  hydrALAZINE (APRESOLINE) 100 MG tablet TAKE 1 TABLET BY MOUTH 3 TIMES DAILY 09/05/21   Lelon Perla, MD  losartan (COZAAR) 100 MG tablet TAKE 1 TABLET BY MOUTH EVERY DAY 01/10/21   Lelon Perla, MD  Multiple Vitamin (MULTIVITAMIN) tablet Take 1 tablet by mouth daily.    [provider]  Probiotic Product (PROBIOTIC DAILY PO) Take 1 capsule by mouth daily.     [provider]  rosuvastatin (CRESTOR) 10 MG tablet TAKE 1/2 TABLET BY MOUTH TWICE PER WEEK FOR CHOLESTEROL 05/17/21   Lelon Perla, MD      Allergies    Penicillins, Sulfa antibiotics, and Beta adrenergic blockers    Review of Systems   Review of Systems  Respiratory:  Positive for shortness of breath.   Gastrointestinal:  Positive for abdominal pain and diarrhea.  Genitourinary:  Negative for dysuria.  Neurological:  Positive for numbness and headaches. Negative for weakness.    Physical Exam Updated Vital Signs BP (!) 150/56   Pulse 71   Temp 97.9 F (36.6 C) (Oral)   Resp (!) 22   Ht 4\' 11"  (1.499 m)   Wt 64 kg   SpO2 100%   BMI 28.50 kg/m  Physical Exam Vitals and nursing  note reviewed.  Constitutional:      Appearance: She is well-developed. She is not diaphoretic.  HENT:     Head: Normocephalic and atraumatic.  Cardiovascular:     Rate and Rhythm: Normal rate and regular rhythm.     Heart sounds: Normal heart sounds.  Pulmonary:     Effort: Pulmonary effort is normal.     Breath sounds: Normal breath sounds.  Abdominal:     Palpations: Abdomen is soft.     Tenderness: There is generalized abdominal tenderness.  Skin:    General: Skin is warm and dry.  Neurological:     Mental Status: She is alert.     Comments: Equal strength in all 4 extremities.  Grossly normal sensation throughout.  No slurred speech or facial droop.     ED Results / Procedures / Treatments   Labs (all labs  ordered are listed, but only abnormal results are displayed) Labs Reviewed  COMPREHENSIVE METABOLIC PANEL - Abnormal; Notable for the following components:      Result Value   Sodium 129 (*)    CO2 18 (*)    Glucose, Bld 101 (*)    Creatinine, Ser 1.45 (*)    GFR, Estimated 35 (*)    All other components within normal limits  LIPASE, BLOOD - Abnormal; Notable for the following components:   Lipase 84 (*)    All other components within normal limits  CBC WITH DIFFERENTIAL/PLATELET - Abnormal; Notable for the following components:   RBC 3.64 (*)    Hemoglobin 11.4 (*)    HCT 33.0 (*)    All other components within normal limits  URINALYSIS, ROUTINE W REFLEX MICROSCOPIC - Abnormal; Notable for the following components:   Leukocytes,Ua SMALL (*)    All other components within normal limits  URINALYSIS, MICROSCOPIC (REFLEX) - Abnormal; Notable for the following components:   Bacteria, UA RARE (*)    All other components within normal limits  MAGNESIUM    EKG None  Radiology CT ABDOMEN PELVIS W CONTRAST  Result Date: 11/17/2021 CLINICAL DATA:  Abdomen pain EXAM: CT ABDOMEN AND PELVIS WITH CONTRAST TECHNIQUE: Multidetector CT imaging of the abdomen and pelvis was performed using the standard protocol following bolus administration of intravenous contrast. RADIATION DOSE REDUCTION: This exam was performed according to the departmental dose-optimization program which includes automated exposure control, adjustment of the mA and/or kV according to patient size and/or use of iterative reconstruction technique. CONTRAST:  84mL OMNIPAQUE IOHEXOL 300 MG/ML  SOLN COMPARISON:  None Available. FINDINGS: Lower chest: Lung bases demonstrate no acute airspace disease. Cardiomegaly. Mitral calcifications. Hepatobiliary: No focal liver abnormality is seen. Status post cholecystectomy. No biliary dilatation. Pancreas: Unremarkable. No pancreatic ductal dilatation or surrounding inflammatory changes.  Spleen: Normal in size without focal abnormality. Adrenals/Urinary Tract: Adrenal glands are within normal limits. Kidneys show no hydronephrosis. Subcentimeter hypodensities too small to further characterize. 1.1 cm possible delayed enhancing lesion mid left kidney, series 2, image 19 and series 7, image 10. The bladder is unremarkable Stomach/Bowel: The stomach is nonenlarged. No acute bowel wall thickening. Mild diffuse air and fluid-filled small bowel without well-defined transition. Negative appendix. Vascular/Lymphatic: Moderate aortic atherosclerosis. No aneurysm. No suspicious lymph nodes. Reproductive: Uterus and bilateral adnexa are unremarkable. Other: Negative for free air or free fluid. Fat containing umbilical hernia Musculoskeletal: Right hip replacement. No acute osseous abnormality IMPRESSION: 1. Mild diffuse air fluid filled small bowel without well-defined transition suggestive of ileus or mild enteritis. No convincing evidence for a  bowel obstruction or bowel wall thickening. 2. Indeterminate 1.1 cm hypodensity within the mid left kidney. When the patient is clinically stable and able to follow directions and hold their breath (preferably as an outpatient) further evaluation with dedicated abdominal MRI should be considered. Electronically Signed   By: Donavan Foil M.D.   On: 11/17/2021 23:05   DG Chest Portable 1 View  Result Date: 11/17/2021 CLINICAL DATA:  Shortness of breath EXAM: PORTABLE CHEST 1 VIEW COMPARISON:  08/07/2021 FINDINGS: Cardiac shadow is stable. Aortic calcifications are again seen. Lungs are well aerated bilaterally. No focal infiltrate or effusion is seen. No bony abnormality is noted. IMPRESSION: No acute abnormality noted. Electronically Signed   By: Inez Catalina M.D.   On: 11/17/2021 21:33   CT Head Wo Contrast  Result Date: 11/17/2021 CLINICAL DATA:  Sudden onset headaches EXAM: CT HEAD WITHOUT CONTRAST TECHNIQUE: Contiguous axial images were obtained from the  base of the skull through the vertex without intravenous contrast. RADIATION DOSE REDUCTION: This exam was performed according to the departmental dose-optimization program which includes automated exposure control, adjustment of the mA and/or kV according to patient size and/or use of iterative reconstruction technique. COMPARISON:  08/24/2020 FINDINGS: Brain: No evidence of acute infarction, hemorrhage, hydrocephalus, extra-axial collection or mass lesion/mass effect. Chronic left occipital infarct is again identified. Mild atrophic changes and chronic white matter ischemic changes are noted. Basal ganglia calcifications are seen. Vascular: No hyperdense vessel or unexpected calcification. Skull: Normal. Negative for fracture or focal lesion. Sinuses/Orbits: No acute finding. Other: None. IMPRESSION: Chronic atrophic and ischemic changes without acute abnormality. Electronically Signed   By: Inez Catalina M.D.   On: 11/17/2021 21:32    Procedures Procedures    Medications Ordered in ED Medications  HYDROmorphone (DILAUDID) injection 0.5 mg (0.5 mg Intravenous Given 11/17/21 2251)  sodium chloride 0.9 % bolus 500 mL (500 mLs Intravenous New Bag/Given 11/17/21 2251)  iohexol (OMNIPAQUE) 300 MG/ML solution 80 mL (80 mLs Intravenous Contrast Given 11/17/21 2235)    ED Course/ Medical Decision Making/ A&P Clinical Course as of 11/17/21 2316  Fri Nov 17, 2021  2029 Patient is having diffuse tingling/numbness.  It is more prominent in the right upper extremity but that is chronic.  While she does have a headache, does not seem like this is a code stroke situation.  Likely more paresthesias.  Will order CT head and start work-up for abdominal pain. [SG]    Clinical Course User Index [SG] Sherwood Gambler, MD                           Medical Decision Making Amount and/or Complexity of Data Reviewed Labs: ordered.    Details: Mild hyponatremia compared to baseline.  Normal WBC.  Lipase of 84 is not  consistent with pancreatitis.  Normal electrolytes otherwise including normal potassium and magnesium. Radiology: ordered and independent interpretation performed.    Details: No SBO ECG/medicine tests: independent interpretation performed.    Details: Chronic findings without ischemia.  Risk Prescription drug management.   Patient is continue to complain of abdominal pain so she was given a small bolus of fluid and a small dose of Dilaudid.  Recently received this and so she will need reevaluation.  As above, work-up is not showing anything particularly concerning.  However she is still complaining of fairly significant pain.  Will need reassessment after. Care to Dr. Florina Ou.        Final Clinical Impression(s) /  ED Diagnoses Final diagnoses:  None    Rx / DC Orders ED Discharge Orders     None         Sherwood Gambler, MD 11/17/21 2339

## 2021-11-18 ENCOUNTER — Encounter (HOSPITAL_COMMUNITY): Payer: Self-pay

## 2021-11-18 DIAGNOSIS — N183 Chronic kidney disease, stage 3 unspecified: Secondary | ICD-10-CM | POA: Diagnosis not present

## 2021-11-18 DIAGNOSIS — N289 Disorder of kidney and ureter, unspecified: Secondary | ICD-10-CM

## 2021-11-18 DIAGNOSIS — I272 Pulmonary hypertension, unspecified: Secondary | ICD-10-CM

## 2021-11-18 DIAGNOSIS — R1084 Generalized abdominal pain: Secondary | ICD-10-CM | POA: Diagnosis not present

## 2021-11-18 DIAGNOSIS — Z87891 Personal history of nicotine dependence: Secondary | ICD-10-CM | POA: Diagnosis not present

## 2021-11-18 DIAGNOSIS — R519 Headache, unspecified: Secondary | ICD-10-CM | POA: Diagnosis not present

## 2021-11-18 DIAGNOSIS — I4821 Permanent atrial fibrillation: Secondary | ICD-10-CM | POA: Diagnosis not present

## 2021-11-18 DIAGNOSIS — Z7901 Long term (current) use of anticoagulants: Secondary | ICD-10-CM | POA: Diagnosis not present

## 2021-11-18 DIAGNOSIS — Z96641 Presence of right artificial hip joint: Secondary | ICD-10-CM | POA: Diagnosis not present

## 2021-11-18 DIAGNOSIS — I10 Essential (primary) hypertension: Secondary | ICD-10-CM

## 2021-11-18 DIAGNOSIS — Z79899 Other long term (current) drug therapy: Secondary | ICD-10-CM | POA: Diagnosis not present

## 2021-11-18 DIAGNOSIS — A084 Viral intestinal infection, unspecified: Secondary | ICD-10-CM

## 2021-11-18 DIAGNOSIS — I129 Hypertensive chronic kidney disease with stage 1 through stage 4 chronic kidney disease, or unspecified chronic kidney disease: Secondary | ICD-10-CM | POA: Diagnosis not present

## 2021-11-18 DIAGNOSIS — R109 Unspecified abdominal pain: Secondary | ICD-10-CM | POA: Diagnosis not present

## 2021-11-18 DIAGNOSIS — Z8673 Personal history of transient ischemic attack (TIA), and cerebral infarction without residual deficits: Secondary | ICD-10-CM | POA: Diagnosis not present

## 2021-11-18 LAB — BASIC METABOLIC PANEL
Anion gap: 6 (ref 5–15)
BUN: 16 mg/dL (ref 8–23)
CO2: 23 mmol/L (ref 22–32)
Calcium: 8.9 mg/dL (ref 8.9–10.3)
Chloride: 102 mmol/L (ref 98–111)
Creatinine, Ser: 1.47 mg/dL — ABNORMAL HIGH (ref 0.44–1.00)
GFR, Estimated: 35 mL/min — ABNORMAL LOW (ref >60)
Glucose, Bld: 112 mg/dL — ABNORMAL HIGH (ref 70–99)
Potassium: 4 mmol/L (ref 3.5–5.1)
Sodium: 131 mmol/L — ABNORMAL LOW (ref 135–145)

## 2021-11-18 LAB — CBC
HCT: 31.5 % — ABNORMAL LOW (ref 36.0–46.0)
Hemoglobin: 11 g/dL — ABNORMAL LOW (ref 12.0–15.0)
MCH: 32.4 pg (ref 26.0–34.0)
MCHC: 34.9 g/dL (ref 30.0–36.0)
MCV: 92.9 fL (ref 80.0–100.0)
Platelets: 204 10*3/uL (ref 150–400)
RBC: 3.39 MIL/uL — ABNORMAL LOW (ref 3.87–5.11)
RDW: 12.9 % (ref 11.5–15.5)
WBC: 5.2 10*3/uL (ref 4.0–10.5)
nRBC: 0 % (ref 0.0–0.2)

## 2021-11-18 LAB — LACTIC ACID, PLASMA
Lactic Acid, Venous: 1.1 mmol/L (ref 0.5–1.9)
Lactic Acid, Venous: 1.7 mmol/L (ref 0.5–1.9)

## 2021-11-18 MED ORDER — ONDANSETRON HCL 4 MG PO TABS: 4.0000 mg | ORAL_TABLET | Freq: Four times a day (QID) | ORAL | Status: DC | PRN

## 2021-11-18 MED ORDER — APIXABAN 2.5 MG PO TABS
2.5000 mg | ORAL_TABLET | Freq: Two times a day (BID) | ORAL | Status: DC
  Administered 2021-11-18: 2.5 mg via ORAL
  Filled 2021-11-18: qty 1

## 2021-11-18 MED ORDER — HYDRALAZINE HCL 50 MG PO TABS
100.0000 mg | ORAL_TABLET | Freq: Three times a day (TID) | ORAL | Status: DC
  Administered 2021-11-18: 100 mg via ORAL
  Filled 2021-11-18: qty 2

## 2021-11-18 MED ORDER — ROSUVASTATIN CALCIUM 5 MG PO TABS: 5.0000 mg | ORAL_TABLET | ORAL | Status: DC

## 2021-11-18 MED ORDER — FAMOTIDINE 20 MG PO TABS
20.0000 mg | ORAL_TABLET | Freq: Every day | ORAL | Status: DC
  Administered 2021-11-18: 20 mg via ORAL
  Filled 2021-11-18: qty 1

## 2021-11-18 MED ORDER — GABAPENTIN 100 MG PO CAPS
100.0000 mg | ORAL_CAPSULE | Freq: Three times a day (TID) | ORAL | Status: DC
  Administered 2021-11-18: 100 mg via ORAL
  Filled 2021-11-18: qty 1

## 2021-11-18 MED ORDER — ESCITALOPRAM OXALATE 20 MG PO TABS: 20.0000 mg | ORAL_TABLET | Freq: Every evening | ORAL | Status: DC

## 2021-11-18 MED ORDER — LOSARTAN POTASSIUM 50 MG PO TABS
100.0000 mg | ORAL_TABLET | Freq: Every day | ORAL | Status: DC
  Administered 2021-11-18: 100 mg via ORAL
  Filled 2021-11-18: qty 2

## 2021-11-18 MED ORDER — LACTATED RINGERS IV SOLN: INTRAVENOUS | Status: DC

## 2021-11-18 MED ORDER — ADULT MULTIVITAMIN W/MINERALS CH
1.0000 | ORAL_TABLET | Freq: Every day | ORAL | Status: DC
  Administered 2021-11-18: 1 via ORAL
  Filled 2021-11-18: qty 1

## 2021-11-18 MED ORDER — ALUM & MAG HYDROXIDE-SIMETH 200-200-20 MG/5ML PO SUSP
30.0000 mL | Freq: Once | ORAL | Status: AC
  Administered 2021-11-18: 30 mL via ORAL
  Filled 2021-11-18: qty 30

## 2021-11-18 MED ORDER — ACETAMINOPHEN 325 MG PO TABS: 650.0000 mg | ORAL_TABLET | Freq: Four times a day (QID) | ORAL | Status: DC | PRN

## 2021-11-18 MED ORDER — HYDROMORPHONE HCL 1 MG/ML IJ SOLN
0.5000 mg | Freq: Once | INTRAMUSCULAR | Status: AC
  Administered 2021-11-18: 0.5 mg via INTRAVENOUS
  Filled 2021-11-18: qty 1

## 2021-11-18 MED ORDER — ONDANSETRON HCL 4 MG/2ML IJ SOLN: 4.0000 mg | Freq: Four times a day (QID) | INTRAMUSCULAR | Status: DC | PRN

## 2021-11-18 MED ORDER — ACETAMINOPHEN 325 MG PO TABS
650.0000 mg | ORAL_TABLET | Freq: Two times a day (BID) | ORAL | Status: DC
  Administered 2021-11-18: 650 mg via ORAL
  Filled 2021-11-18: qty 2

## 2021-11-18 MED ORDER — ACETAMINOPHEN 650 MG RE SUPP: 650.0000 mg | Freq: Four times a day (QID) | RECTAL | Status: DC | PRN

## 2021-11-18 MED ORDER — PANTOPRAZOLE SODIUM 40 MG IV SOLR
40.0000 mg | Freq: Once | INTRAVENOUS | Status: AC
  Administered 2021-11-18: 40 mg via INTRAVENOUS
  Filled 2021-11-18: qty 10

## 2021-11-18 MED ORDER — HYDROMORPHONE HCL 1 MG/ML IJ SOLN: 0.5000 mg | INTRAMUSCULAR | Status: DC | PRN

## 2021-11-18 NOTE — Assessment & Plan Note (Signed)
Continue eliquis  ?

## 2021-11-18 NOTE — Progress Notes (Signed)
  [facility] will assume care on arrival to accepting facility. Until arrival, care as per EDP. However, [facility] available 24/7 for questions and assistance.   Nursing staff please page [facility] Admits and Consults ([phone]) as soon as the patient arrives to the hospital.  Shimeka Bacot, DO [facility]  

## 2021-11-18 NOTE — Discharge Summary (Signed)
Discharge Summary  Lorraine Page [id number] DOB: 1932-08-23  PCP: Sandrea Hughs, NP  Admit date: 11/17/2021 Discharge date: 11/18/2021    Time spent:  64mins  Recommendations for Outpatient Follow-up:  F/u with PCP within a week  for hospital discharge follow up, repeat cbc/bmp at follow up, pcp to follow up in left kidney lesion  "Indeterminate 1.1 cm hypodensity within the mid left kidney. When the patient is clinically stable and able to follow directions and hold their breath (preferably as an outpatient) further evaluation with dedicated abdominal MRI should be considered."  Discharge Diagnoses:  Active Hospital Problems   Diagnosis Date Noted   Abdominal pain 11/18/2021    Priority: High   Pulmonary hypertension, moderate to severe ([place]) 02/11/2019    Priority: Medium    Kidney lesion, native, left 11/18/2021    Priority: Low   Permanent atrial fibrillation ([place]) 12/16/2018    Priority: Low   Essential hypertension 12/16/2018    Priority: Low   CRI (chronic renal insufficiency), stage 3 (moderate) ([place]) 12/16/2018    Priority: [place] Hospital Problems  No resolved problems to display.    Discharge Condition: stable  Diet recommendation: heart healthy  Filed Weights   11/17/21 2025  Weight: 64 kg    History of present illness: ( per admitting provider Dr Alcario Drought ) HPI: Lorraine Page is a 88 y.o. female with medical history significant of A.fib on eliquis, HTN, HLD, stroke.   Pt in to ED today with c/o abd pain and distention.  Symptoms onset 8h PTA.     She is having abdominal pain and has a history of IBS but the patient is telling me that she is never had pain this bad in her abdomen.  Was given Gas-X but still having pain.  Has also had a couple episodes of diarrhea earlier in the day.  Over the last couple hours she is also developed a headache.  The abdominal pain is worse than the headache.  Has neuropathy and chronic right  upper extremity numbness from previous stroke and all of these are worse.  She is having numbness and tingling in all 4 extremities, worse in the right upper.  Some dyspnea earlier, though family feels that was probably anxiety related and seems improved. No chest pain.  Family member is a cardiology PA who advised her to take an extra dose of Lasix, which she did.   After arrival to [facility] had 2 episodes of bilious emesis.  Hospital Course:  Principal Problem:   Abdominal pain Active Problems:   Pulmonary hypertension, moderate to severe (HCC)   Permanent atrial fibrillation (HCC)   CRI (chronic renal insufficiency), stage 3 (moderate) (HCC)   Essential hypertension   Kidney lesion, native, left   Assessment and Plan:  Possible viral gastroenteritis -Present with abdomen pain nausea and vomiting -CT showing small bowel ileus vs enteritis. -Her symptom has improved/resolved after bowel rest and IV hydration, she tolerated diet advancement having bowel functions, Ab pain resolved, she desires to go home, daughter is in agreement -did well with PT, she already has a walker at home    Pulmonary hypertension, moderate to severe ([place]) asix held in the hospital,  resume at discharge  Kidney lesion, native, left Indeterminate 1.1 cm hypodensity within the mid left kidney. When the patient is clinically stable and able to follow directions and hold their breath (preferably as an outpatient) further evaluation with dedicated abdominal MRI should be considered  Follow up imaging as outpt.  Essential hypertension Stable, continue home meds  CRI (chronic renal insufficiency), stage 3 (moderate) (HCC) Cr  appears to be consistent with recent baseline.  Permanent atrial fibrillation (HCC) Continue eliquis    Discharge Exam: BP (!) 156/62 (BP Location: Left Arm)   Pulse 74   Temp 98.3 F (36.8 C) (Oral)   Resp 18   Ht 4\' 11"  (1.499 m)   Wt 64 kg   SpO2 96%   BMI 28.50 kg/m    General: NAD, + memory impairment ( reports from prior CVA) Cardiovascular: RRR Respiratory: normal respiratory effort     Discharge Instructions     Diet - low sodium heart healthy   Complete by: As directed    Please stay on full liquid for 1-2 days, advance diet as tolerated   Increase activity slowly   Complete by: As directed       Allergies as of 11/18/2021       Reactions   Penicillins Anaphylaxis, Swelling   Local swelling, throat swelling   Sulfa Antibiotics Anaphylaxis, Hives      Beta Adrenergic Blockers Other (See Comments)   bradycardia        Medication List     TAKE these medications    acetaminophen 650 MG CR tablet Commonly known as: TYLENOL Take 1,300 mg by mouth 3 (three) times daily.   apixaban 2.5 MG Tabs tablet Commonly known as: Eliquis Take 1 tablet (2.5 mg total) by mouth 2 (two) times daily.   CALCIUM 600+D3 PO Take 1 tablet by mouth daily.   cimetidine 200 MG tablet Commonly known as: TAGAMET Take 200 mg by mouth daily.   escitalopram 20 MG tablet Commonly known as: LEXAPRO TAKE 1 TABLET BY MOUTH EVERY EVENING   furosemide 20 MG tablet Commonly known as: LASIX TAKE 2 TABLETS (40 MG) BY MOUTH DAILY What changed: See the new instructions.   gabapentin 100 MG capsule Commonly known as: NEURONTIN TAKE 1 CAPSULE BY MOUTH THREE TIMES A DAY What changed: See the new instructions.   hydrALAZINE 100 MG tablet Commonly known as: APRESOLINE TAKE 1 TABLET BY MOUTH 3 TIMES DAILY   losartan 100 MG tablet Commonly known as: COZAAR TAKE 1 TABLET BY MOUTH EVERY DAY   multivitamin tablet Take 1 tablet by mouth daily.   PROBIOTIC DAILY PO Take 1 capsule by mouth daily.   rosuvastatin 10 MG tablet Commonly known as: CRESTOR TAKE 1/2 TABLET BY MOUTH TWICE PER WEEK FOR CHOLESTEROL What changed: See the new instructions.       Allergies  Allergen Reactions   Penicillins Anaphylaxis and Swelling    Local swelling, throat  swelling    Sulfa Antibiotics Anaphylaxis and Hives        Beta Adrenergic Blockers Other (See Comments)    bradycardia    Follow-up Information     Ngetich, Dinah C, NP Follow up in 1 week(s).   Specialty: Family Medicine Why: hospital discharge follow up, repeat basic lab works including cbc/bmp Contact information: [place] [address] [phone]                  The results of significant diagnostics from this hospitalization (including imaging, microbiology, ancillary and laboratory) are listed below for reference.    Significant Diagnostic Studies: CT ABDOMEN PELVIS W CONTRAST  Result Date: 11/17/2021 CLINICAL DATA:  Abdomen pain EXAM: CT ABDOMEN AND PELVIS WITH CONTRAST TECHNIQUE: Multidetector CT imaging of the abdomen and  pelvis was performed using the standard protocol following bolus administration of intravenous contrast. RADIATION DOSE REDUCTION: This exam was performed according to the departmental dose-optimization program which includes automated exposure control, adjustment of the mA and/or kV according to patient size and/or use of iterative reconstruction technique. CONTRAST:  62mL OMNIPAQUE IOHEXOL 300 MG/ML  SOLN COMPARISON:  None Available. FINDINGS: Lower chest: Lung bases demonstrate no acute airspace disease. Cardiomegaly. Mitral calcifications. Hepatobiliary: No focal liver abnormality is seen. Status post cholecystectomy. No biliary dilatation. Pancreas: Unremarkable. No pancreatic ductal dilatation or surrounding inflammatory changes. Spleen: Normal in size without focal abnormality. Adrenals/Urinary Tract: Adrenal glands are within normal limits. Kidneys show no hydronephrosis. Subcentimeter hypodensities too small to further characterize. 1.1 cm possible delayed enhancing lesion mid left kidney, series 2, image 19 and series 7, image 10. The bladder is unremarkable Stomach/Bowel: The stomach is nonenlarged. No acute bowel wall  thickening. Mild diffuse air and fluid-filled small bowel without well-defined transition. Negative appendix. Vascular/Lymphatic: Moderate aortic atherosclerosis. No aneurysm. No suspicious lymph nodes. Reproductive: Uterus and bilateral adnexa are unremarkable. Other: Negative for free air or free fluid. Fat containing umbilical hernia Musculoskeletal: Right hip replacement. No acute osseous abnormality IMPRESSION: 1. Mild diffuse air fluid filled small bowel without well-defined transition suggestive of ileus or mild enteritis. No convincing evidence for a bowel obstruction or bowel wall thickening. 2. Indeterminate 1.1 cm hypodensity within the mid left kidney. When the patient is clinically stable and able to follow directions and hold their breath (preferably as an outpatient) further evaluation with dedicated abdominal MRI should be considered. Electronically Signed   By: Donavan Foil M.D.   On: 11/17/2021 23:05   DG Chest Portable 1 View  Result Date: 11/17/2021 CLINICAL DATA:  Shortness of breath EXAM: PORTABLE CHEST 1 VIEW COMPARISON:  08/07/2021 FINDINGS: Cardiac shadow is stable. Aortic calcifications are again seen. Lungs are well aerated bilaterally. No focal infiltrate or effusion is seen. No bony abnormality is noted. IMPRESSION: No acute abnormality noted. Electronically Signed   By: Inez Catalina M.D.   On: 11/17/2021 21:33   CT Head Wo Contrast  Result Date: 11/17/2021 CLINICAL DATA:  Sudden onset headaches EXAM: CT HEAD WITHOUT CONTRAST TECHNIQUE: Contiguous axial images were obtained from the base of the skull through the vertex without intravenous contrast. RADIATION DOSE REDUCTION: This exam was performed according to the departmental dose-optimization program which includes automated exposure control, adjustment of the mA and/or kV according to patient size and/or use of iterative reconstruction technique. COMPARISON:  08/24/2020 FINDINGS: Brain: No evidence of acute infarction,  hemorrhage, hydrocephalus, extra-axial collection or mass lesion/mass effect. Chronic left occipital infarct is again identified. Mild atrophic changes and chronic white matter ischemic changes are noted. Basal ganglia calcifications are seen. Vascular: No hyperdense vessel or unexpected calcification. Skull: Normal. Negative for fracture or focal lesion. Sinuses/Orbits: No acute finding. Other: None. IMPRESSION: Chronic atrophic and ischemic changes without acute abnormality. Electronically Signed   By: Inez Catalina M.D.   On: 11/17/2021 21:32    Microbiology: No results found for this or any previous visit (from the past 240 hour(s)).   Labs: Basic Metabolic Panel: Recent Labs  Lab 11/17/21 2108 11/18/21 0554  NA 129* 131*  K 3.9 4.0  CL 99 102  CO2 18* 23  GLUCOSE 101* 112*  BUN 20 16  CREATININE 1.45* 1.47*  CALCIUM 9.0 8.9  MG 1.9  --    Liver Function Tests: Recent Labs  Lab 11/17/21 2108  AST 33  ALT 20  ALKPHOS 43  BILITOT 0.6  PROT 7.6  ALBUMIN 4.2   Recent Labs  Lab 11/17/21 2108  LIPASE 84*   No results for input(s): "AMMONIA" in the last 168 hours. CBC: Recent Labs  Lab 11/17/21 2108 11/18/21 0554  WBC 4.4 5.2  NEUTROABS 2.7  --   HGB 11.4* 11.0*  HCT 33.0* 31.5*  MCV 90.7 92.9  PLT 221 204   Cardiac Enzymes: No results for input(s): "CKTOTAL", "CKMB", "CKMBINDEX", "TROPONINI" in the last 168 hours. BNP: BNP (last 3 results) No results for input(s): "BNP" in the last 8760 hours.  ProBNP (last 3 results) No results for input(s): "PROBNP" in the last 8760 hours.  CBG: No results for input(s): "GLUCAP" in the last 168 hours.  FURTHER DISCHARGE INSTRUCTIONS:   Get Medicines reviewed and adjusted: Please take all your medications with you for your next visit with your Primary MD   Laboratory/radiological data: Please request your Primary MD to go over all hospital tests and procedure/radiological results at the follow up, please ask your  Primary MD to get all Hospital records sent to his/her office.   In some cases, they will be blood work, cultures and biopsy results pending at the time of your discharge. Please request that your primary care M.D. goes through all the records of your hospital data and follows up on these results.   Also Note the following: If you experience worsening of your admission symptoms, develop shortness of breath, life threatening emergency, suicidal or homicidal thoughts you must seek medical attention immediately by calling 911 or calling your MD immediately  if symptoms less severe.   You must read complete instructions/literature along with all the possible adverse reactions/side effects for all the Medicines you take and that have been prescribed to you. Take any new Medicines after you have completely understood and accpet all the possible adverse reactions/side effects.    Do not drive when taking Pain medications or sleeping medications (Benzodaizepines)   Do not take more than prescribed Pain, Sleep and Anxiety Medications. It is not advisable to combine anxiety,sleep and pain medications without talking with your primary care practitioner   Special Instructions: If you have smoked or chewed Tobacco  in the last 2 yrs please stop smoking, stop any regular Alcohol  and or any Recreational drug use.   Wear Seat belts while driving.   Please note: You were cared for by a hospitalist during your hospital stay. Once you are discharged, your primary care physician will handle any further medical issues. Please note that NO REFILLS for any discharge medications will be authorized once you are discharged, as it is imperative that you return to your primary care physician (or establish a relationship with a primary care physician if you do not have one) for your post hospital discharge needs so that they can reassess your need for medications and monitor your lab values.     Signed:  Florencia Reasons MD,  PhD, FACP  [facility] 11/18/2021, 6:40 PM

## 2021-11-18 NOTE — Assessment & Plan Note (Signed)
Creat today appears to be consistent with recent baseline.

## 2021-11-18 NOTE — H&P (Signed)
History and Physical    Patient: Lorraine Page IWP:[id number] DOB: [birth year] DOA: 11/17/2021 DOS: the patient was seen and examined on 11/18/2021 PCP: NgetichNelda Bucks, NP  Patient coming from: Home  Chief Complaint:  Chief Complaint  Patient presents with   Abdominal Pain   HPI: Lorraine Page is a 88 y.o. female with medical history significant of A.fib on eliquis, HTN, HLD, stroke.  Pt in to ED today with c/o abd pain and distention.  Symptoms onset 8h PTA.    She is having abdominal pain and has a history of IBS but the patient is telling me that she is never had pain this bad in her abdomen.  Was given Gas-X but still having pain.  Has also had a couple episodes of diarrhea earlier in the day.  Over the last couple hours she is also developed a headache.  The abdominal pain is worse than the headache.  Has neuropathy and chronic right upper extremity numbness from previous stroke and all of these are worse.  She is having numbness and tingling in all 4 extremities, worse in the right upper.  Some dyspnea earlier, though family feels that was probably anxiety related and seems improved. No chest pain.  Family member is a cardiology PA who advised her to take an extra dose of Lasix, which she did.  After arrival to [facility] had 2 episodes of bilious emesis.   Review of Systems: As mentioned in the history of present illness. All other systems reviewed and are negative. Past Medical History:  Diagnosis Date   Atrial fibrillation ([place])    Chronic kidney disease, stage 3 (HCC)    CVA (cerebral vascular accident) ([place])    GERD (gastroesophageal reflux disease)    GERD (gastroesophageal reflux disease)    Hyperlipidemia    Hypertension    Osteoarthritis    Prediabetes    Past Surgical History:  Procedure Laterality Date   carpel tunnel Right    CHOLECYSTECTOMY     RHINOPLASTY     TOTAL HIP ARTHROPLASTY Right 08/12/2018   Procedure: RIGHT TOTAL HIP ARTHROPLASTY ANTERIOR  APPROACH;  Surgeon: Mcarthur Rossetti, MD;  Location: [facility];  Service: Orthopedics;  Laterality: Right;   Social History:  reports that she has quit smoking. She has never used smokeless tobacco. She reports that she does not currently use alcohol. She reports that she does not use drugs.  Allergies  Allergen Reactions   Penicillins Anaphylaxis and Swelling    Local swelling, throat swelling    Sulfa Antibiotics Anaphylaxis and Hives        Beta Adrenergic Blockers Other (See Comments)    bradycardia    Family History  Problem Relation Age of Onset   Cancer Mother    Stomach cancer Mother    CAD Father    Pneumonia Sister    Breast cancer Sister    Aortic stenosis Sister    Congestive Heart Failure Sister    Gastric cancer Brother     Prior to Admission medications   Medication Sig Start Date End Date Taking? Authorizing Provider  acetaminophen (TYLENOL) 650 MG CR tablet Take 1,300 mg by mouth 3 (three) times daily.   Yes [provider]  apixaban (ELIQUIS) 2.5 MG TABS tablet Take 1 tablet (2.5 mg total) by mouth 2 (two) times daily. 05/22/21  Yes Lelon Perla, MD  Calcium Carb-Cholecalciferol (CALCIUM 600+D3 PO) Take 1 tablet by mouth daily.   Yes [provider]  cimetidine (  TAGAMET) 200 MG tablet Take 200 mg by mouth daily.    Yes [provider]  escitalopram (LEXAPRO) 20 MG tablet TAKE 1 TABLET BY MOUTH EVERY EVENING 08/22/21  Yes Crenshaw, Denice Bors, MD  furosemide (LASIX) 20 MG tablet TAKE 2 TABLETS (40 MG) BY MOUTH DAILY Patient taking differently: Take 40 mg by mouth daily. 09/05/21  Yes Lelon Perla, MD  gabapentin (NEURONTIN) 100 MG capsule TAKE 1 CAPSULE BY MOUTH THREE TIMES A DAY Patient taking differently: Take 100 mg by mouth at bedtime. 11/08/21  Yes Ngetich, Dinah C, NP  hydrALAZINE (APRESOLINE) 100 MG tablet TAKE 1 TABLET BY MOUTH 3 TIMES DAILY 09/05/21  Yes Crenshaw, Denice Bors, MD  losartan (COZAAR) 100 MG tablet TAKE 1 TABLET  BY MOUTH EVERY DAY Patient taking differently: Take 100 mg by mouth daily. 01/10/21  Yes Lelon Perla, MD  Multiple Vitamin (MULTIVITAMIN) tablet Take 1 tablet by mouth daily.   Yes [provider]  Probiotic Product (PROBIOTIC DAILY PO) Take 1 capsule by mouth daily.    Yes [provider]  rosuvastatin (CRESTOR) 10 MG tablet TAKE 1/2 TABLET BY MOUTH TWICE PER WEEK FOR CHOLESTEROL Patient taking differently: Take 5 mg by mouth 2 (two) times a week. Take 1/2 tablet by mouth every Tuesday and Friday for cholesterol 05/17/21  Yes Lelon Perla, MD    Physical Exam: Vitals:   11/18/21 0000 11/18/21 0030 11/18/21 0200 11/18/21 0306  BP: (!) 159/60 (!) 146/70 (!) 145/53 (!) 148/52  Pulse: 79 83 74 75  Resp: 19 15 14 18   Temp:  98.4 F (36.9 C)  98.2 F (36.8 C)  TempSrc:  Oral  Oral  SpO2: 98% 98% 98% 93%  Weight:      Height:       Constitutional: NAD, calm, comfortable Eyes: PERRL, lids and conjunctivae normal ENMT: Mucous membranes are moist. Posterior pharynx clear of any exudate or lesions.Normal dentition.  Neck: normal, supple, no masses, no thyromegaly Respiratory: clear to auscultation bilaterally, no wheezing, no crackles. Normal respiratory effort. No accessory muscle use.  Cardiovascular: Regular rate and rhythm, no murmurs / rubs / gallops. No extremity edema. 2+ pedal pulses. No carotid bruits.  Abdomen: Distended, diffuse TTP Musculoskeletal: no clubbing / cyanosis. No joint deformity upper and lower extremities. Good ROM, no contractures. Normal muscle tone.  Skin: no rashes, lesions, ulcers. No induration Neurologic: CN 2-12 grossly intact. Sensation intact, DTR normal. Strength 5/5 in all 4.  Psychiatric: Normal judgment and insight. Alert and oriented x 3. Normal mood.   Data Reviewed:    CT AP: IMPRESSION: 1. Mild diffuse air fluid filled small bowel without well-defined transition suggestive of ileus or mild enteritis. No  convincing evidence for a bowel obstruction or bowel wall thickening. 2. Indeterminate 1.1 cm hypodensity within the mid left kidney. When the patient is clinically stable and able to follow directions and hold their breath (preferably as an outpatient) further evaluation with dedicated abdominal MRI should be considered.     Latest Ref Rng & Units 11/17/2021    9:08 PM 05/19/2021    9:21 AM 03/30/2020   10:03 AM  CBC  WBC 4.0 - 10.5 K/uL 4.4  4.5  5.0   Hemoglobin 12.0 - 15.0 g/dL 11.4  11.8  12.9   Hematocrit 36.0 - 46.0 % 33.0  36.2  39.5   Platelets 150 - 400 K/uL 221  207  213        Latest Ref Rng &  Units 11/17/2021    9:08 PM 05/19/2021    9:21 AM 06/08/2020   11:32 AM  CMP  Glucose 70 - 99 mg/dL 101  92  103   BUN 8 - 23 mg/dL 20  9  22    Creatinine 0.44 - 1.00 mg/dL 1.45  1.35  1.34   Sodium 135 - 145 mmol/L 129  142  140   Potassium 3.5 - 5.1 mmol/L 3.9  4.3  4.6   Chloride 98 - 111 mmol/L 99  103  102   CO2 22 - 32 mmol/L 18  24  22    Calcium 8.9 - 10.3 mg/dL 9.0  7.8  9.3   Total Protein 6.5 - 8.1 g/dL 7.6  6.8    Total Bilirubin 0.3 - 1.2 mg/dL 0.6  0.3    Alkaline Phos 38 - 126 U/L 43  45    AST 15 - 41 U/L 33  26    ALT 0 - 44 U/L 20  15       Assessment and Plan: * Abdominal pain CT showing small bowel ileus vs enteritis. Acting quite like ileus as well now with the nausea vomiting NPO IVF Checking lactic acid, but I would have expected mesenteric ischemia to have shown up on the contrasted CT Repeat CBC/BMP in AM May want to consider gen-surg vs GI consult in AM for the ileus. Has hernia present on CT as well, but this doesn't seem to be causing obstruction or a transition point on CT, also hernia not painful per patient. Zofran PRN nausea Dilaudid PRN pain If she develops intractable N/V -> may need NGT, but will hold off for the moment.  Pulmonary hypertension, moderate to severe (HCC) Hold lasix for the moment  Kidney lesion, native, left Follow  up imaging as outpt.  Essential hypertension Continue hydralazine and losartan  CRI (chronic renal insufficiency), stage 3 (moderate) (HCC) Creat today appears to be consistent with recent baseline.  Permanent atrial fibrillation ([place]) Continue eliquis      Advance Care Planning:   Code Status: Full Code   Consults: None  Family Communication: No family in room  Severity of Illness: The appropriate patient status for this patient is OBSERVATION. Observation status is judged to be reasonable and necessary in order to provide the required intensity of service to ensure the patient's safety. The patient's presenting symptoms, physical exam findings, and initial radiographic and laboratory data in the context of their medical condition is felt to place them at decreased risk for further clinical deterioration. Furthermore, it is anticipated that the patient will be medically stable for discharge from the hospital within 2 midnights of admission.   Author: Etta Quill., DO 11/18/2021 4:23 AM  For on call review [email or web address].

## 2021-11-18 NOTE — Progress Notes (Signed)
[name] to be D/C'd  per MD order.  Discussed with the patient and daughter all questions fully answered.  VSS, Skin clean, dry and intact without evidence of skin break down, no evidence of skin tears noted.  IV catheter discontinued intact. Site without signs and symptoms of complications. Dressing and pressure applied.  An After Visit Summary was printed and given to the patient.   D/c education completed with patient/family including follow up instructions, medication list, d/c activities limitations if indicated, with other d/c instructions as indicated by MD - patient able to verbalize understanding, all questions fully answered.   Patient instructed to return to ED, call 911, or call MD for any changes in condition.   Patient to be escorted via [place], and D/C home via private auto.

## 2021-11-18 NOTE — Assessment & Plan Note (Signed)
Hold lasix for the moment

## 2021-11-18 NOTE — ED Provider Notes (Signed)
12:33 AM Patient still complaining of pain despite 2 doses of Dilaudid 0.5 mg IV.  Abdomen diffusely tender.  We will have her admitted.  12:46 AM Dr. Bridgett Larsson to admit to hospitalist service.     Lorraine Page, Lorraine Reichmann, MD 11/18/21 [phone]

## 2021-11-18 NOTE — Assessment & Plan Note (Signed)
Follow up imaging as outpt.

## 2021-11-18 NOTE — Evaluation (Signed)
Physical Therapy Evaluation & Discharge  Patient Details Name: Lorraine Page MRN: [id number] DOB: 08/05/1932 Today's Date: 11/18/2021  History of Present Illness  88 y/o female presented to ED on 11/17/21 for abdominal pain. CT showed small bowel ileus vs enteritis. PMH: pulmonary HTN, HTN, CKD stage 3, Afib, hx of CVA  Clinical Impression  Patient admitted with the above. PTA, patient lives alone but daughter checks in on her every day. She uses rollator and is primarily a household ambulator and denies falls. Patient functioning at supervision level for mobility with use of RW. Patient feels at baseline for mobility. Daughter will be able to provide intermittent supervision which is adequate. Will defer further mobility to mobility specialists and nursing staff. No further skilled PT needs identified acutely. No PT follow up recommended at this time. PT will sign off.      Recommendations for follow up therapy are one component of a multi-disciplinary discharge planning process, led by the attending physician.  Recommendations may be updated based on patient status, additional functional criteria and insurance authorization.  Follow Up Recommendations No PT follow up      Assistance Recommended at Discharge Intermittent Supervision/Assistance  Patient can return home with the following       Equipment Recommendations None recommended by PT  Recommendations for Other Services       Functional Status Assessment Patient has had a recent decline in their functional status and demonstrates the ability to make significant improvements in function in a reasonable and predictable amount of time.     Precautions / Restrictions Precautions Precautions: Fall Restrictions Weight Bearing Restrictions: No      Mobility  Bed Mobility Overal bed mobility: Modified Independent                  Transfers Overall transfer level: Needs assistance Equipment used: Rolling Stephano Arrants (2  wheels) Transfers: Sit to/from Stand Sit to Stand: Supervision                Ambulation/Gait Ambulation/Gait assistance: Supervision Gait Distance (Feet): 30 Feet Assistive device: Rolling Mertis Mosher (2 wheels) Gait Pattern/deviations: Step-through pattern, Decreased stride length Gait velocity: decreased     General Gait Details: supervision for safety. No overt LOB  Stairs            Wheelchair Mobility    Modified Rankin (Stroke Patients Only)       Balance Overall balance assessment: Needs assistance Sitting-balance support: No upper extremity supported Sitting balance-Leahy Scale: Normal     Standing balance support: Bilateral upper extremity supported, Reliant on assistive device for balance Standing balance-Leahy Scale: Poor                               Pertinent Vitals/Pain Pain Assessment Pain Assessment: Faces Faces Pain Scale: Hurts little more Pain Location: abdomen Pain Descriptors / Indicators: Sore Pain Intervention(s): Monitored during session    Home Living Family/patient expects to be discharged to:: Private residence Living Arrangements: Alone Available Help at Discharge: Family Type of Home: House Home Access: Stairs to enter Entrance Stairs-Rails: Right;Left;Can reach both Technical brewer of Steps: 3   Home Layout: One level Home Equipment: Rollator (4 wheels);Rolling Symphanie Cederberg (2 wheels);BSC/3in1      Prior Function Prior Level of Function : Needs assist             Mobility Comments: uses rollator ADLs Comments: daughter assists with getting in tub and bathing  Hand Dominance        Extremity/Trunk Assessment   Upper Extremity Assessment Upper Extremity Assessment: RUE deficits/detail RUE Deficits / Details: residual weakness and numbness from CVA    Lower Extremity Assessment Lower Extremity Assessment: Generalized weakness    Cervical / Trunk Assessment Cervical / Trunk Assessment:  Kyphotic  Communication   Communication: No difficulties  Cognition Arousal/Alertness: Awake/alert Behavior During Therapy: WFL for tasks assessed/performed Overall Cognitive Status: No family/caregiver present to determine baseline cognitive functioning                                 General Comments: memory deficits noted with inability to recall use of call bell 1 minute after education        General Comments      Exercises     Assessment/Plan    PT Assessment Patient does not need any further PT services  PT Problem List         PT Treatment Interventions      PT Goals (Current goals can be found in the Care Plan section)  Acute Rehab PT Goals Patient Stated Goal: to go home PT Goal Formulation: All assessment and education complete, DC therapy    Frequency       Co-evaluation               AM-PAC PT "6 Clicks" Mobility  Outcome Measure Help needed turning from your back to your side while in a flat bed without using bedrails?: None Help needed moving from lying on your back to sitting on the side of a flat bed without using bedrails?: None Help needed moving to and from a bed to a chair (including a wheelchair)?: A Little Help needed standing up from a chair using your arms (e.g., wheelchair or bedside chair)?: A Little Help needed to walk in hospital room?: A Little Help needed climbing 3-5 steps with a railing? : A Little 6 Click Score: 20    End of Session   Activity Tolerance: Patient tolerated treatment well Patient left: in chair;with call bell/phone within reach;with chair alarm set Nurse Communication: Mobility status PT Visit Diagnosis: Muscle weakness (generalized) (M62.81);Unsteadiness on feet (R26.81)    Time: [id number] PT Time Calculation (min) (ACUTE ONLY): 17 min   Charges:   PT Evaluation $PT Eval Low Complexity: 1 Low          Hadessah Grennan A. Gilford Rile PT, DPT Acute Rehabilitation Services Office  [phone]   Linna Hoff 11/18/2021, 9:31 AM

## 2021-11-18 NOTE — Assessment & Plan Note (Addendum)
CT showing small bowel ileus vs enteritis. Acting quite like ileus as well now with the nausea vomiting 1. NPO 2. IVF 3. Checking lactic acid, but I would have expected mesenteric ischemia to have shown up on the contrasted CT 4. Repeat CBC/BMP in AM 5. May want to consider gen-surg vs GI consult in AM for the ileus. 1. Has hernia present on CT as well, but this doesn't seem to be causing obstruction or a transition point on CT, also hernia not painful per patient. 6. Zofran PRN nausea 7. Dilaudid PRN pain 8. If she develops intractable N/V -> may need NGT, but will hold off for the moment.

## 2021-11-18 NOTE — Assessment & Plan Note (Signed)
Continue hydralazine and losartan

## 2021-11-20 DIAGNOSIS — R140 Abdominal distension (gaseous): Secondary | ICD-10-CM | POA: Diagnosis not present

## 2021-11-20 DIAGNOSIS — R1084 Generalized abdominal pain: Secondary | ICD-10-CM | POA: Diagnosis not present

## 2021-11-20 DIAGNOSIS — I480 Paroxysmal atrial fibrillation: Secondary | ICD-10-CM | POA: Diagnosis not present

## 2021-11-20 DIAGNOSIS — N281 Cyst of kidney, acquired: Secondary | ICD-10-CM | POA: Diagnosis not present

## 2021-11-20 DIAGNOSIS — N179 Acute kidney failure, unspecified: Secondary | ICD-10-CM | POA: Diagnosis not present

## 2021-11-20 DIAGNOSIS — N183 Chronic kidney disease, stage 3 unspecified: Secondary | ICD-10-CM | POA: Diagnosis not present

## 2021-11-20 DIAGNOSIS — E860 Dehydration: Secondary | ICD-10-CM | POA: Diagnosis not present

## 2021-11-20 DIAGNOSIS — I482 Chronic atrial fibrillation, unspecified: Secondary | ICD-10-CM | POA: Diagnosis not present

## 2021-11-20 DIAGNOSIS — I129 Hypertensive chronic kidney disease with stage 1 through stage 4 chronic kidney disease, or unspecified chronic kidney disease: Secondary | ICD-10-CM | POA: Diagnosis not present

## 2021-11-20 DIAGNOSIS — K429 Umbilical hernia without obstruction or gangrene: Secondary | ICD-10-CM | POA: Diagnosis not present

## 2021-11-20 DIAGNOSIS — I10 Essential (primary) hypertension: Secondary | ICD-10-CM | POA: Diagnosis not present

## 2021-11-20 DIAGNOSIS — R109 Unspecified abdominal pain: Secondary | ICD-10-CM | POA: Diagnosis not present

## 2021-11-21 DIAGNOSIS — I129 Hypertensive chronic kidney disease with stage 1 through stage 4 chronic kidney disease, or unspecified chronic kidney disease: Secondary | ICD-10-CM | POA: Diagnosis not present

## 2021-11-21 DIAGNOSIS — K219 Gastro-esophageal reflux disease without esophagitis: Secondary | ICD-10-CM | POA: Diagnosis not present

## 2021-11-21 DIAGNOSIS — N183 Chronic kidney disease, stage 3 unspecified: Secondary | ICD-10-CM | POA: Diagnosis not present

## 2021-11-21 DIAGNOSIS — E782 Mixed hyperlipidemia: Secondary | ICD-10-CM | POA: Diagnosis not present

## 2021-11-21 DIAGNOSIS — N179 Acute kidney failure, unspecified: Secondary | ICD-10-CM | POA: Diagnosis not present

## 2021-11-21 DIAGNOSIS — I480 Paroxysmal atrial fibrillation: Secondary | ICD-10-CM | POA: Diagnosis not present

## 2021-11-21 NOTE — Telephone Encounter (Signed)
Message routed to Jessica Eubanks, NP due to PCP Ngetich, Dinah C, NP being out of office. 

## 2021-11-22 ENCOUNTER — Encounter: Payer: Self-pay | Admitting: Cardiology

## 2021-11-22 DIAGNOSIS — I480 Paroxysmal atrial fibrillation: Secondary | ICD-10-CM | POA: Diagnosis not present

## 2021-11-22 DIAGNOSIS — N183 Chronic kidney disease, stage 3 unspecified: Secondary | ICD-10-CM | POA: Diagnosis not present

## 2021-11-22 DIAGNOSIS — I5032 Chronic diastolic (congestive) heart failure: Secondary | ICD-10-CM

## 2021-11-22 DIAGNOSIS — K219 Gastro-esophageal reflux disease without esophagitis: Secondary | ICD-10-CM | POA: Diagnosis not present

## 2021-11-22 DIAGNOSIS — I129 Hypertensive chronic kidney disease with stage 1 through stage 4 chronic kidney disease, or unspecified chronic kidney disease: Secondary | ICD-10-CM | POA: Diagnosis not present

## 2021-11-22 DIAGNOSIS — N179 Acute kidney failure, unspecified: Secondary | ICD-10-CM | POA: Diagnosis not present

## 2021-11-22 DIAGNOSIS — E782 Mixed hyperlipidemia: Secondary | ICD-10-CM | POA: Diagnosis not present

## 2021-12-18 DIAGNOSIS — I5032 Chronic diastolic (congestive) heart failure: Secondary | ICD-10-CM | POA: Diagnosis not present

## 2021-12-19 LAB — BASIC METABOLIC PANEL
BUN/Creatinine Ratio: 13 (ref 12–28)
BUN: 17 mg/dL (ref 8–27)
CO2: 21 mmol/L (ref 20–29)
Calcium: 9.4 mg/dL (ref 8.7–10.3)
Chloride: 94 mmol/L — ABNORMAL LOW (ref 96–106)
Creatinine, Ser: 1.3 mg/dL — ABNORMAL HIGH (ref 0.57–1.00)
Glucose: 104 mg/dL — ABNORMAL HIGH (ref 70–99)
Potassium: 4.3 mmol/L (ref 3.5–5.2)
Sodium: 131 mmol/L — ABNORMAL LOW (ref 134–144)
eGFR: 40 mL/min/{1.73_m2} — ABNORMAL LOW (ref >59)

## 2021-12-22 DIAGNOSIS — R140 Abdominal distension (gaseous): Secondary | ICD-10-CM | POA: Diagnosis not present

## 2021-12-22 DIAGNOSIS — R109 Unspecified abdominal pain: Secondary | ICD-10-CM | POA: Diagnosis not present

## 2022-01-08 ENCOUNTER — Other Ambulatory Visit: Payer: Self-pay | Admitting: Cardiology

## 2022-01-08 DIAGNOSIS — I10 Essential (primary) hypertension: Secondary | ICD-10-CM

## 2022-01-16 ENCOUNTER — Ambulatory Visit: Payer: Medicare HMO | Admitting: Family

## 2022-02-13 ENCOUNTER — Ambulatory Visit: Payer: Medicare HMO | Admitting: Family

## 2022-03-12 ENCOUNTER — Encounter: Payer: Self-pay | Admitting: Family

## 2022-03-12 ENCOUNTER — Ambulatory Visit (INDEPENDENT_AMBULATORY_CARE_PROVIDER_SITE_OTHER): Payer: Medicare HMO | Admitting: Family

## 2022-03-12 VITALS — BP 135/48 | HR 72 | Temp 98.1°F | Resp 16 | Ht 60.0 in | Wt 130.0 lb

## 2022-03-12 DIAGNOSIS — K529 Noninfective gastroenteritis and colitis, unspecified: Secondary | ICD-10-CM | POA: Diagnosis not present

## 2022-03-12 DIAGNOSIS — B351 Tinea unguium: Secondary | ICD-10-CM

## 2022-03-12 DIAGNOSIS — I4891 Unspecified atrial fibrillation: Secondary | ICD-10-CM | POA: Diagnosis not present

## 2022-03-12 DIAGNOSIS — N183 Chronic kidney disease, stage 3 unspecified: Secondary | ICD-10-CM | POA: Diagnosis not present

## 2022-03-12 DIAGNOSIS — R7303 Prediabetes: Secondary | ICD-10-CM | POA: Diagnosis not present

## 2022-03-12 DIAGNOSIS — E785 Hyperlipidemia, unspecified: Secondary | ICD-10-CM | POA: Insufficient documentation

## 2022-03-12 DIAGNOSIS — K219 Gastro-esophageal reflux disease without esophagitis: Secondary | ICD-10-CM | POA: Diagnosis not present

## 2022-03-12 DIAGNOSIS — I639 Cerebral infarction, unspecified: Secondary | ICD-10-CM | POA: Insufficient documentation

## 2022-03-12 LAB — LIPID PANEL
Cholesterol: 163 mg/dL (ref 0–200)
HDL: 42.4 mg/dL (ref >39.00)
NonHDL: 120.47
Total CHOL/HDL Ratio: 4
Triglycerides: 268 mg/dL — ABNORMAL HIGH (ref 0.0–149.0)
VLDL: 53.6 mg/dL — ABNORMAL HIGH (ref 0.0–40.0)

## 2022-03-12 LAB — COMPREHENSIVE METABOLIC PANEL
ALT: 13 U/L (ref 0–35)
AST: 22 U/L (ref 0–37)
Albumin: 4.3 g/dL (ref 3.5–5.2)
Alkaline Phosphatase: 45 U/L (ref 39–117)
BUN: 23 mg/dL (ref 6–23)
CO2: 27 mEq/L (ref 19–32)
Calcium: 9.1 mg/dL (ref 8.4–10.5)
Chloride: 102 mEq/L (ref 96–112)
Creatinine, Ser: 1.54 mg/dL — ABNORMAL HIGH (ref 0.40–1.20)
GFR: 30.24 mL/min — ABNORMAL LOW (ref >60.00)
Glucose, Bld: 91 mg/dL (ref 70–99)
Potassium: 4.2 mEq/L (ref 3.5–5.1)
Sodium: 138 mEq/L (ref 135–145)
Total Bilirubin: 0.4 mg/dL (ref 0.2–1.2)
Total Protein: 6.7 g/dL (ref 6.0–8.3)

## 2022-03-12 LAB — LDL CHOLESTEROL, DIRECT: Direct LDL: 82 mg/dL

## 2022-03-12 LAB — HEMOGLOBIN A1C: Hgb A1c MFr Bld: 5.3 % (ref 4.6–6.5)

## 2022-03-12 MED ORDER — SHINGRIX 50 MCG/0.5ML IM SUSR: INTRAMUSCULAR | 0 refills | Status: DC

## 2022-03-12 NOTE — Assessment & Plan Note (Signed)
No results found for: "HGBA1C" Lab Results  Component Value Date   LDLCALC 96 05/19/2021   CREATININE 1.30 (H) 12/18/2021   Unsure of control.  Will check A1C.

## 2022-03-12 NOTE — Assessment & Plan Note (Signed)
Takes crestor.  Lab Results  Component Value Date   CHOL 167 05/19/2021   HDL 39 (L) 05/19/2021   LDLCALC 96 05/19/2021   TRIG 185 (H) 05/19/2021   CHOLHDL 4.3 05/19/2021

## 2022-03-12 NOTE — Assessment & Plan Note (Signed)
Has chronic RUE/RLE weakness.

## 2022-03-12 NOTE — Progress Notes (Signed)
Subjective:   By signing my name below, I, Lorraine Page, attest that this documentation has been prepared under the direction and in the presence of Lorraine Alar, NP. 03/12/2022   Patient ID: Lorraine Page, female    DOB: 1932-04-05, 89 y.o.   MRN: [id number]  Chief Complaint  Patient presents with   New Patient (Initial Visit)    HPI Patient is in today for a new patient. She is present with her daughter during this visit.  Hospital visit: She was hospitalized in October 2023 for bloating and tenderness. She seen an ED and was sent home after they treated her for stomach flu but her symptoms returned after 2 days. She was then taken to the hospital. She was seen by a GI specialist and was given an anti-biotic and found relief.   Diarrhea: She has watery bowel movements. Her daughter reports her bowel movements were normal while on anti-biotics. She does not have a follow up appointment with her GI specialist and is planning on scheduling an appointment. She was never checked for C-diff. For her current symptoms.   Thickened toe nails: She complains of thickened toe nails and cannot cut them with normal clippers.   Pre-diabetes: She has a history of pre-diabetes. She was measuring her blood sugar daily prior to her last stroke.  Nephrology: She is not following up with a nephrologist at this time. She has stage 3 chronic kidney disease.   Neurology: She was following up with a neurologist prior to her stroke but is not seeing them at this time.   Atrial fibulation: She has a history of atrial fibrillation and is taking 2.5 mg Eliquis daily PO. She was taken off it temporarily during her last hospital visit.   Cholesterol: She continues taking 10 mg Crestor daily PO and reports no new issues while taking it.  Lab Results  Component Value Date   CHOL 167 05/19/2021   HDL 39 (L) 05/19/2021   LDLCALC 96 05/19/2021   TRIG 185 (H) 05/19/2021   CHOLHDL 4.3 05/19/2021    Stroke: Her last stroke on 02/25/2017 affected her right side and causes her weakness.   Arthritis: She has a history of arthritis on her right shoulder.   Reflux: Her reflux is stable while taking 200 mg Cimetidine daily PO.   Social history: She worked in the past. She has 3 children (one daughter, 2 sons). She is living by herself and has no issues.  Immunizations: She is UTD on the latest flu and Covid-19 vaccine. She is eligible for the latest shingles vaccine and is interested in receiving it at her pharmacy.    Past Medical History:  Diagnosis Date   Atrial fibrillation ([place])    Chronic kidney disease, stage 3 (HCC)    CVA (cerebral vascular accident) ([place])    GERD (gastroesophageal reflux disease)    GERD (gastroesophageal reflux disease)    Hyperlipidemia    Hypertension    Osteoarthritis    Prediabetes     Past Surgical History:  Procedure Laterality Date   carpel tunnel Right    CHOLECYSTECTOMY     RHINOPLASTY     TOTAL HIP ARTHROPLASTY Right 08/12/2018   Procedure: RIGHT TOTAL HIP ARTHROPLASTY ANTERIOR APPROACH;  Surgeon: Mcarthur Rossetti, MD;  Location: [facility];  Service: Orthopedics;  Laterality: Right;    Family History  Problem Relation Age of Onset   Cancer Mother    Stomach cancer Mother    CAD Father  Pneumonia Sister    Breast cancer Sister    Aortic stenosis Sister    Congestive Heart Failure Sister    Gastric cancer Brother     Social History   Socioeconomic History   Marital status: Widowed    Spouse name: Not on file   Number of children: 3   Years of education: Not on file   Highest education level: Not on file  Occupational History   Not on file  Tobacco Use   Smoking status: Former   Smokeless tobacco: Never  Vaping Use   Vaping Use: Never used  Substance and Sexual Activity   Alcohol use: Not Currently   Drug use: Never   Sexual activity: Not on file  Other Topics Concern   Not on file  Social History Narrative    Tobacco use, amount per day now: None   Past tobacco use, amount per day:    How many years did you use tobacco: Quit 1979 ( maybe 20 years )   Alcohol use (drinks per week): None   Diet:   Do you drink/eat things with caffeine: Coffee   Marital status:    Widowed                              What year were you married? 49 years married in 1965   Do you live in a house, apartment, assisted living, condo, trailer, etc.? House   Is it one or more stories? 1   How many persons live in your home? 1   Do you have pets in your home?( please list) None.   Highest Level of education completed? High School 12th grade.   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   Do you exercise?    None                              Type and how often?   Do you have a living will? Yes   Do you have a DNR form?    No                               If not, do you want to discuss one?   Do you have signed POA/HPOA forms?  Yes                      If so, please bring to you appointment      Do you have any difficulty bathing or dressing yourself? No   Do you have any difficulty preparing food or eating? No   Do you have any difficulty managing your medications? No   Do you have any difficulty managing your finances? Yes   Do you have any difficulty affording your medications? Yes   Social Determinants of Health   Financial Resource Strain: Not on file  Food Insecurity: No Food Insecurity (11/18/2021)   Hunger Vital Sign    Worried About Running Out of Food in the Last Year: Never true    Ran Out of Food in the Last Year: Never true  Transportation Needs: No Transportation Needs (11/18/2021)   PRAPARE - Administrator, Civil Service (Medical): No    Lack of Transportation (Non-Medical): No  Physical Activity: Not on file  Stress: Not on  file  Social Connections: Not on file  Intimate Partner Violence: Not At Risk (11/18/2021)   Humiliation, Afraid, Rape, and Kick  questionnaire    Fear of Current or Ex-Partner: No    Emotionally Abused: No    Physically Abused: No    Sexually Abused: No    Outpatient Medications Prior to Visit  Medication Sig Dispense Refill   acetaminophen (TYLENOL) 650 MG CR tablet Take 1,300 mg by mouth 3 (three) times daily.     apixaban (ELIQUIS) 2.5 MG TABS tablet Take 1 tablet (2.5 mg total) by mouth 2 (two) times daily. 180 tablet 3   Calcium Carb-Cholecalciferol (CALCIUM 600+D3 PO) Take 1 tablet by mouth daily.     cimetidine (TAGAMET) 200 MG tablet Take 200 mg by mouth daily.      escitalopram (LEXAPRO) 20 MG tablet TAKE 1 TABLET BY MOUTH EVERY EVENING 90 tablet 3   furosemide (LASIX) 20 MG tablet TAKE 2 TABLETS (40 MG) BY MOUTH DAILY (Patient taking differently: Take 40 mg by mouth daily.) 180 tablet 3   gabapentin (NEURONTIN) 100 MG capsule TAKE 1 CAPSULE BY MOUTH THREE TIMES A DAY (Patient taking differently: Take 100 mg by mouth at bedtime.) 90 capsule 3   hydrALAZINE (APRESOLINE) 100 MG tablet TAKE 1 TABLET BY MOUTH 3 TIMES DAILY 90 tablet 3   losartan (COZAAR) 100 MG tablet TAKE 1 TABLET BY MOUTH EVERY DAY 90 tablet 3   Multiple Vitamin (MULTIVITAMIN) tablet Take 1 tablet by mouth daily.     Probiotic Product (PROBIOTIC DAILY PO) Take 1 capsule by mouth daily.      rosuvastatin (CRESTOR) 10 MG tablet TAKE 1/2 TABLET BY MOUTH TWICE PER WEEK FOR CHOLESTEROL (Patient taking differently: Take 5 mg by mouth 2 (two) times a week. Take 1/2 tablet by mouth every Tuesday and Friday for cholesterol) 30 tablet 3   No facility-administered medications prior to visit.    Allergies  Allergen Reactions   Penicillins Anaphylaxis and Swelling    Local swelling, throat swelling    Sulfa Antibiotics Anaphylaxis and Hives        Beta Adrenergic Blockers Other (See Comments)    bradycardia    Review of Systems  Skin:        (+)thickened toe nails       Objective:    Physical Exam Constitutional:      General: She is  not in acute distress.    Appearance: Normal appearance. She is not ill-appearing.  HENT:     Head: Normocephalic and atraumatic.     Right Ear: External ear normal.     Left Ear: External ear normal.  Eyes:     Extraocular Movements: Extraocular movements intact.     Pupils: Pupils are equal, round, and reactive to light.  Cardiovascular:     Rate and Rhythm: Normal rate and regular rhythm.     Heart sounds: Murmur heard.     Systolic murmur is present with a grade of 1/6.     No gallop.  Pulmonary:     Effort: Pulmonary effort is normal. No respiratory distress.     Breath sounds: Normal breath sounds. No wheezing or rales.  Musculoskeletal:     Comments: 5/5 strength in both upper and lower extremities  Skin:    General: Skin is warm and dry.  Neurological:     Mental Status: She is alert and oriented to person, place, and time.  Psychiatric:        Judgment:  Judgment normal.     BP (!) 135/48 (BP Location: Left Arm, Patient Position: Sitting, Cuff Size: Small)   Pulse 72   Temp 98.1 F (36.7 C) (Oral)   Resp 16   Ht 5' (1.524 m)   Wt 130 lb (59 kg)   SpO2 99%   BMI 25.39 kg/m  Wt Readings from Last 3 Encounters:  03/12/22 130 lb (59 kg)  11/17/21 141 lb 1.5 oz (64 kg)  10/18/21 138 lb (62.6 kg)       Assessment & Plan:  There are no diagnoses linked to this encounter.  I, Lorraine Reeves Dam, personally preformed the services described in this documentation.  All medical record entries made by the scribe were at my direction and in my presence.  I have reviewed the chart and discharge instructions (if applicable) and agree that the record reflects my personal performance and is accurate and complete. 03/12/2022   I,Lorraine Page,acting as a scribe for Nance Pear, NP.,have documented all relevant documentation on the behalf of Nance Pear, NP,as directed by  Nance Pear, NP while in the presence of Nance Pear, NP.   Lorraine  [employer]

## 2022-03-12 NOTE — Assessment & Plan Note (Signed)
Reports symptoms are stable on cimetidine.

## 2022-03-12 NOTE — Assessment & Plan Note (Signed)
Lab Results  Component Value Date   CREATININE 1.30 (H) 12/18/2021   Will update creatinine today.

## 2022-03-12 NOTE — Assessment & Plan Note (Signed)
On Eliquis. Followed by Dr. Stanford Breed.  Rate stable.

## 2022-03-13 DIAGNOSIS — B351 Tinea unguium: Secondary | ICD-10-CM | POA: Insufficient documentation

## 2022-03-13 NOTE — Assessment & Plan Note (Signed)
Toenails trimmed today with nail nipper. Pt tolerated procedure.

## 2022-03-14 ENCOUNTER — Other Ambulatory Visit: Payer: Medicare HMO

## 2022-03-14 DIAGNOSIS — K529 Noninfective gastroenteritis and colitis, unspecified: Secondary | ICD-10-CM | POA: Diagnosis not present

## 2022-03-17 LAB — CLOSTRIDIUM DIFFICILE BY PCR: Toxigenic C. Difficile by PCR: NEGATIVE

## 2022-04-05 ENCOUNTER — Ambulatory Visit: Payer: Medicare HMO | Admitting: Family

## 2022-04-18 NOTE — Progress Notes (Signed)
HPI: FU atrial fibrillation.  Patient was off of Eliquis for hip injection in January 2019 and suffered an embolic CVA. She has some gait instability from previous CVA.  Monitor December 2020 showed sinus bradycardia with occasional PAC, PVC, junctional rhythm and occasional pauses with longest being 4.7 seconds.  Metoprolol discontinued. Patient felt to have acute on chronic diastolic congestive heart failure.  Echocardiogram repeated July 2023 and showed normal LV function, severe left atrial enlargement, mild mitral regurgitation.  Patient given Lasix with some improvement.  Abdominal CT October 2023 showed 1.1 cm hypodensity in the left kidney and abdominal MRI recommended.  Since last seen, she denies dyspnea, chest pain, palpitations or syncope.  Current Outpatient Medications  Medication Sig Dispense Refill   acetaminophen (TYLENOL) 650 MG CR tablet Take 1,300 mg by mouth 3 (three) times daily.     apixaban (ELIQUIS) 2.5 MG TABS tablet Take 1 tablet (2.5 mg total) by mouth 2 (two) times daily. 180 tablet 3   Calcium Carb-Cholecalciferol (CALCIUM 600+D3 PO) Take 1 tablet by mouth daily.     cimetidine (TAGAMET) 200 MG tablet Take 200 mg by mouth daily.      escitalopram (LEXAPRO) 20 MG tablet TAKE 1 TABLET BY MOUTH EVERY EVENING 90 tablet 3   furosemide (LASIX) 20 MG tablet TAKE 2 TABLETS (40 MG) BY MOUTH DAILY 180 tablet 3   gabapentin (NEURONTIN) 100 MG capsule TAKE 1 CAPSULE BY MOUTH THREE TIMES A DAY 90 capsule 3   hydrALAZINE (APRESOLINE) 100 MG tablet TAKE 1 TABLET BY MOUTH 3 TIMES DAILY 90 tablet 3   losartan (COZAAR) 100 MG tablet TAKE 1 TABLET BY MOUTH EVERY DAY 90 tablet 3   Multiple Vitamin (MULTIVITAMIN) tablet Take 1 tablet by mouth daily.     Probiotic Product (PROBIOTIC DAILY PO) Take 1 capsule by mouth daily.      rosuvastatin (CRESTOR) 10 MG tablet TAKE 1/2 TABLET BY MOUTH TWICE PER WEEK FOR CHOLESTEROL 30 tablet 3   Zoster Vaccine Adjuvanted [facility]) injection Inject  0.5mg  IM now and again in 2-6 months. 0.5 mL 0   No current facility-administered medications for this visit.     Past Medical History:  Diagnosis Date   Atrial fibrillation ([place])    Chronic kidney disease, stage 3 (HCC)    CVA (cerebral vascular accident) ([place])    GERD (gastroesophageal reflux disease)    GERD (gastroesophageal reflux disease)    Hyperlipidemia    Hypertension    Osteoarthritis    Prediabetes     Past Surgical History:  Procedure Laterality Date   carpel tunnel Right    CHOLECYSTECTOMY     RHINOPLASTY     TOTAL HIP ARTHROPLASTY Right 08/12/2018   Procedure: RIGHT TOTAL HIP ARTHROPLASTY ANTERIOR APPROACH;  Surgeon: Mcarthur Rossetti, MD;  Location: [place];  Service: Orthopedics;  Laterality: Right;    Social History   Socioeconomic History   Marital status: Widowed    Spouse name: Not on file   Number of children: 3   Years of education: Not on file   Highest education level: Not on file  Occupational History   Not on file  Tobacco Use   Smoking status: Former   Smokeless tobacco: Never  Vaping Use   Vaping Use: Never used  Substance and Sexual Activity   Alcohol use: Not Currently   Drug use: Never   Sexual activity: Not on file  Other Topics Concern   Not on file  Social History  Narrative   Tobacco use, amount per day now: None   Has 2 sons and a daughter    Past tobacco use, amount per day:    How many years did you use tobacco: Quit 1979 ( maybe 20 years )   Alcohol use (drinks per week): None   Diet:   Do you drink/eat things with caffeine: Coffee   Marital status:    Widowed                              What year were you married? 64 years married in [place] you live in a house, apartment, assisted living, condo, trailer, etc.? House   Is it one or more stories? 1   How many persons live in your home? 1   Do you have pets in your home?( please list) None.   Highest Level of education completed? High School 12th grade.    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   Do you exercise?    None                              Type and how often?   Do you have a living will? Yes   Do you have a DNR form?    No                               If not, do you want to discuss one?   Do you have signed POA/HPOA forms?  Yes                      If so, please bring to you appointment      Do you have any difficulty bathing or dressing yourself? No   Do you have any difficulty preparing food or eating? No   Do you have any difficulty managing your medications? No   Do you have any difficulty managing your finances? Yes   Do you have any difficulty affording your medications? Yes   Social Determinants of Health   Financial Resource Strain: Not on file  Food Insecurity: No Food Insecurity (11/18/2021)   Hunger Vital Sign    Worried About Running Out of Food in the Last Year: Never true    Ran Out of Food in the Last Year: Never true  Transportation Needs: No Transportation Needs (11/18/2021)   PRAPARE - Hydrologist (Medical): No    Lack of Transportation (Non-Medical): No  Physical Activity: Not on file  Stress: Not on file  Social Connections: Not on file  Intimate Partner Violence: Not At Risk (11/18/2021)   Humiliation, Afraid, Rape, and Kick questionnaire    Fear of Current or Ex-Partner: No    Emotionally Abused: No    Physically Abused: No    Sexually Abused: No    Family History  Problem Relation Age of Onset   Cancer Mother    Stomach cancer Mother    CAD Father    Pneumonia Sister    Breast cancer Sister    Aortic stenosis Sister    Congestive Heart Failure Sister    Gastric cancer Brother     ROS: Neck and arm pain but no fevers or chills, productive cough, hemoptysis, dysphasia, odynophagia, melena, hematochezia, dysuria,  hematuria, rash, seizure activity, orthopnea, PND, pedal edema, claudication. Remaining systems are negative.  Physical  Exam: Well-developed well-nourished in no acute distress.  Skin is warm and dry.  HEENT is normal.  Neck is supple.  Chest is clear to auscultation with normal expansion.  Cardiovascular exam is regular rate and rhythm.  Abdominal exam nontender or distended. No masses palpated. Extremities show no edema. neuro grossly intact   A/P  1 chronic diastolic congestive heart failure-patient appears to be euvolemic.  Will continue Lasix.  2 paroxysmal atrial fibrillation-she remains in sinus rhythm.  Beta-blocker discontinued previously due to bradycardia.  Continue apixaban 2.5 mg twice daily.  3 history of pulmonary hypertension-likely secondary to pulmonary venous hypertension.  Continue diuretic.  4 hypertension-blood pressure controlled.  Continue present medications.  5 hyperlipidemia-continue statin.  Kirk Ruths, MD

## 2022-04-25 ENCOUNTER — Encounter: Payer: Self-pay | Admitting: Cardiology

## 2022-04-25 ENCOUNTER — Ambulatory Visit: Payer: Medicare HMO | Attending: Cardiology | Admitting: Cardiology

## 2022-04-25 VITALS — BP 146/66 | HR 72 | Ht 60.0 in | Wt 127.8 lb

## 2022-04-25 DIAGNOSIS — I5032 Chronic diastolic (congestive) heart failure: Secondary | ICD-10-CM | POA: Diagnosis not present

## 2022-04-25 DIAGNOSIS — I10 Essential (primary) hypertension: Secondary | ICD-10-CM

## 2022-04-25 DIAGNOSIS — I480 Paroxysmal atrial fibrillation: Secondary | ICD-10-CM

## 2022-04-25 DIAGNOSIS — E7800 Pure hypercholesterolemia, unspecified: Secondary | ICD-10-CM

## 2022-04-25 NOTE — Patient Instructions (Signed)
    Follow-Up: At [facility], you and your health needs are our priority.  As part of our continuing mission to provide you with exceptional heart care, we have created designated Provider Care Teams.  These Care Teams include your primary Cardiologist (physician) and Advanced Practice Providers (APPs -  Physician Assistants and Nurse Practitioners) who all work together to provide you with the care you need, when you need it.  We recommend signing up for the patient portal called "MyChart".  Sign up information is provided on this After Visit Summary.  MyChart is used to connect with patients for Virtual Visits (Telemedicine).  Patients are able to view lab/test results, encounter notes, upcoming appointments, etc.  Non-urgent messages can be sent to your provider as well.   To learn more about what you can do with MyChart, go to [email or web address].    Your next appointment:   6 month(s)  Provider:   Brian Crenshaw, MD      

## 2022-05-08 ENCOUNTER — Encounter: Payer: Self-pay | Admitting: Family

## 2022-05-11 ENCOUNTER — Other Ambulatory Visit: Payer: Self-pay | Admitting: *Deleted

## 2022-05-11 DIAGNOSIS — I10 Essential (primary) hypertension: Secondary | ICD-10-CM

## 2022-05-15 ENCOUNTER — Telehealth: Payer: Self-pay | Admitting: Family

## 2022-05-15 NOTE — Telephone Encounter (Signed)
Copied from CRM [id number]. Topic: Medicare AWV >> May 15, 2022 11:09 AM Payton Doughty wrote: Reason for CRM: Called patient to schedule Medicare Annual Wellness Visit (AWV). Left message for patient to call back and schedule Medicare Annual Wellness Visit (AWV).  Last date of AWV: 10/08/20  Please schedule an appointment at any time with Donne Anon, CMA  .  If any questions, please contact me.  Thank you ,  Verlee Rossetti; Care Guide Ambulatory Clinical Support [place] [facility] Direct Dial: [phone]

## 2022-06-08 ENCOUNTER — Other Ambulatory Visit: Payer: Self-pay | Admitting: Family

## 2022-06-08 ENCOUNTER — Other Ambulatory Visit: Payer: Self-pay | Admitting: Cardiology

## 2022-06-08 DIAGNOSIS — I10 Essential (primary) hypertension: Secondary | ICD-10-CM

## 2022-06-12 ENCOUNTER — Ambulatory Visit (INDEPENDENT_AMBULATORY_CARE_PROVIDER_SITE_OTHER): Payer: Medicare HMO | Admitting: Family

## 2022-06-12 VITALS — BP 132/50 | HR 69 | Temp 97.5°F | Resp 16 | Wt 127.0 lb

## 2022-06-12 DIAGNOSIS — N2889 Other specified disorders of kidney and ureter: Secondary | ICD-10-CM | POA: Insufficient documentation

## 2022-06-12 DIAGNOSIS — N183 Chronic kidney disease, stage 3 unspecified: Secondary | ICD-10-CM

## 2022-06-12 DIAGNOSIS — I10 Essential (primary) hypertension: Secondary | ICD-10-CM | POA: Diagnosis not present

## 2022-06-12 DIAGNOSIS — N289 Disorder of kidney and ureter, unspecified: Secondary | ICD-10-CM

## 2022-06-12 DIAGNOSIS — K219 Gastro-esophageal reflux disease without esophagitis: Secondary | ICD-10-CM | POA: Diagnosis not present

## 2022-06-12 DIAGNOSIS — Z8673 Personal history of transient ischemic attack (TIA), and cerebral infarction without residual deficits: Secondary | ICD-10-CM | POA: Diagnosis not present

## 2022-06-12 DIAGNOSIS — R7303 Prediabetes: Secondary | ICD-10-CM

## 2022-06-12 DIAGNOSIS — F32A Depression, unspecified: Secondary | ICD-10-CM | POA: Diagnosis not present

## 2022-06-12 DIAGNOSIS — I4891 Unspecified atrial fibrillation: Secondary | ICD-10-CM

## 2022-06-12 DIAGNOSIS — F3341 Major depressive disorder, recurrent, in partial remission: Secondary | ICD-10-CM | POA: Insufficient documentation

## 2022-06-12 DIAGNOSIS — I639 Cerebral infarction, unspecified: Secondary | ICD-10-CM

## 2022-06-12 MED ORDER — GABAPENTIN 100 MG PO CAPS: 200.0000 mg | ORAL_CAPSULE | Freq: Three times a day (TID) | ORAL | 3 refills | Status: DC

## 2022-06-12 NOTE — Assessment & Plan Note (Signed)
Will refer to nephrology for further evaluation.   

## 2022-06-12 NOTE — Assessment & Plan Note (Addendum)
BP Readings from Last 3 Encounters:  06/12/22 (!) 132/50  04/25/22 (!) 146/66  03/12/22 (!) 135/48   Initial bp was elevated upon arrival but follow up was OK. Continue current meds.

## 2022-06-12 NOTE — Assessment & Plan Note (Signed)
Will obtain MRI for further evaluation.

## 2022-06-12 NOTE — Assessment & Plan Note (Signed)
Rate stable, maintained on Eliquis. Followed by cardiology.

## 2022-06-12 NOTE — Assessment & Plan Note (Signed)
Stable on lexapro 20mg.  Continue same.  

## 2022-06-12 NOTE — Progress Notes (Signed)
Subjective:   By signing my name below, I, Lorraine Page, attest that this documentation has been prepared under the direction and in the presence of [name], NP 06/12/22   Patient ID: Lorraine Page, female    DOB: 16-Mar-1932, 89 y.o.   MRN: [id number]  Chief Complaint  Patient presents with   Hypertension    Here for follow up    HPI Patient is in today for a 3 month follow up.   Right-sided Numbness/Swelling: She complains of arm numbness and mouth drooping and swelling as a result of a previous stroke. She states she tends to bite the side of her cheek in her mouth due to facial drooping, which causes the mouth swelling. She notes her symptoms tend to worsen later in the day. She takes 100 mg Gabapentin twice daily, which seems to help her symptoms.   Hypertension: Her blood pressure was elevated this visit at 162/54 during initial check. When rechecked, her blood pressure was 132/50. She has been compliant with 100 mg hydralazine three times daily.   Cholesterol: She has been compliant with Rosuvastatin.   Mood: She has been taking her 20 mg Lexapro daily. Her mood is overall stable.   Past Medical History:  Diagnosis Date   Atrial fibrillation (HCC)    Chronic kidney disease, stage 3 (HCC)    CVA (cerebral vascular accident) (HCC)    GERD (gastroesophageal reflux disease)    GERD (gastroesophageal reflux disease)    Hyperlipidemia    Hypertension    Osteoarthritis    Prediabetes     Past Surgical History:  Procedure Laterality Date   carpel tunnel Right    CHOLECYSTECTOMY     RHINOPLASTY     TOTAL HIP ARTHROPLASTY Right 08/12/2018   Procedure: RIGHT TOTAL HIP ARTHROPLASTY ANTERIOR APPROACH;  Surgeon: Kathryne Hitch, MD;  Location: [facility] OR;  Service: Orthopedics;  Laterality: Right;    Family History  Problem Relation Age of Onset   Cancer Mother    Stomach cancer Mother    CAD Father    Pneumonia Sister    Breast cancer Sister     Aortic stenosis Sister    Congestive Heart Failure Sister    Gastric cancer Brother     Social History   Socioeconomic History   Marital status: Widowed    Spouse name: Not on file   Number of children: 3   Years of education: Not on file   Highest education level: Not on file  Occupational History   Not on file  Tobacco Use   Smoking status: Former   Smokeless tobacco: Never  Vaping Use   Vaping Use: Never used  Substance and Sexual Activity   Alcohol use: Not Currently   Drug use: Never   Sexual activity: Not on file  Other Topics Concern   Not on file  Social History Narrative   Tobacco use, amount per day now: None   Has 2 sons and a daughter    Past tobacco use, amount per day:    How many years did you use tobacco: Quit 1979 ( maybe 20 years )   Alcohol use (drinks per week): None   Diet:   Do you drink/eat things with caffeine: Coffee   Marital status:    Widowed                              What year were you  married? 49 years married in 1965   Do you live in a house, apartment, assisted living, condo, trailer, etc.? House   Is it one or more stories? 1   How many persons live in your home? 1   Do you have pets in your home?( please list) None.   Highest Level of education completed? High School 12th grade.   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   Do you exercise?    None                              Type and how often?   Do you have a living will? Yes   Do you have a DNR form?    No                               If not, do you want to discuss one?   Do you have signed POA/HPOA forms?  Yes                      If so, please bring to you appointment      Do you have any difficulty bathing or dressing yourself? No   Do you have any difficulty preparing food or eating? No   Do you have any difficulty managing your medications? No   Do you have any difficulty managing your finances? Yes   Do you have any difficulty affording your  medications? Yes   Social Determinants of Health   Financial Resource Strain: Not on file  Food Insecurity: No Food Insecurity (11/18/2021)   Hunger Vital Sign    Worried About Running Out of Food in the Last Year: Never true    Ran Out of Food in the Last Year: Never true  Transportation Needs: No Transportation Needs (11/18/2021)   PRAPARE - Administrator, Civil Service (Medical): No    Lack of Transportation (Non-Medical): No  Physical Activity: Not on file  Stress: Not on file  Social Connections: Not on file  Intimate Partner Violence: Not At Risk (11/18/2021)   Humiliation, Afraid, Rape, and Kick questionnaire    Fear of Current or Ex-Partner: No    Emotionally Abused: No    Physically Abused: No    Sexually Abused: No    Outpatient Medications Prior to Visit  Medication Sig Dispense Refill   acetaminophen (TYLENOL) 650 MG CR tablet Take 1,300 mg by mouth 3 (three) times daily.     apixaban (ELIQUIS) 2.5 MG TABS tablet Take 1 tablet (2.5 mg total) by mouth 2 (two) times daily. 180 tablet 3   Calcium Carb-Cholecalciferol (CALCIUM 600+D3 PO) Take 1 tablet by mouth daily.     cimetidine (TAGAMET) 200 MG tablet Take 200 mg by mouth daily.      escitalopram (LEXAPRO) 20 MG tablet TAKE 1 TABLET BY MOUTH EVERY EVENING 90 tablet 3   furosemide (LASIX) 20 MG tablet TAKE 2 TABLETS (40 MG) BY MOUTH DAILY 180 tablet 3   hydrALAZINE (APRESOLINE) 100 MG tablet TAKE 1 TABLET BY MOUTH 3 TIMES DAILY 90 tablet 3   losartan (COZAAR) 100 MG tablet TAKE 1 TABLET BY MOUTH EVERY DAY 90 tablet 3   Multiple Vitamin (MULTIVITAMIN) tablet Take 1 tablet by mouth daily.     Probiotic Product (PROBIOTIC DAILY PO) Take 1 capsule by mouth daily.  rosuvastatin (CRESTOR) 10 MG tablet TAKE 1/2 TABLET BY MOUTH TWICE PER WEEK FOR CHOLESTEROL 30 tablet 3   Zoster Vaccine Adjuvanted [facility]) injection Inject 0.5mg  IM now and again in 2-6 months. 0.5 mL 0   gabapentin (NEURONTIN) 100 MG capsule  TAKE 1 CAPSULE BY MOUTH THREE TIMES A DAY 90 capsule 3   No facility-administered medications prior to visit.    Allergies  Allergen Reactions   Penicillins Anaphylaxis and Swelling    Local swelling, throat swelling    Sulfa Antibiotics Anaphylaxis and Hives    Hives   Beta Adrenergic Blockers Other (See Comments)    bradycardia    Review of Systems  Neurological:        (+) right sided numbness in arm and facial drooping -- due to previous stroke       Objective:    Physical Exam Constitutional:      General: She is not in acute distress.    Appearance: Normal appearance. She is well-developed.  HENT:     Head: Normocephalic and atraumatic.     Right Ear: External ear normal.     Left Ear: External ear normal.  Eyes:     General: No scleral icterus. Neck:     Thyroid: No thyromegaly.  Cardiovascular:     Rate and Rhythm: Normal rate and regular rhythm.     Heart sounds: Normal heart sounds. No murmur heard. Pulmonary:     Effort: Pulmonary effort is normal. No respiratory distress.     Breath sounds: Normal breath sounds. No wheezing.  Musculoskeletal:     Cervical back: Neck supple.  Skin:    General: Skin is warm and dry.  Neurological:     Mental Status: She is alert and oriented to person, place, and time.  Psychiatric:        Mood and Affect: Mood normal.        Behavior: Behavior normal.        Thought Content: Thought content normal.        Judgment: Judgment normal.     BP (!) 132/50 (BP Location: Left Arm, Patient Position: Sitting)   Pulse 69   Temp (!) 97.5 F (36.4 C) (Oral)   Resp 16   Wt 127 lb (57.6 kg)   SpO2 99%   BMI 24.80 kg/m  Wt Readings from Last 3 Encounters:  06/12/22 127 lb (57.6 kg)  04/25/22 127 lb 12.8 oz (58 kg)  03/12/22 130 lb (59 kg)       Assessment & Plan:  Hypertension, unspecified type Assessment & Plan: BP Readings from Last 3 Encounters:  06/12/22 (!) 132/50  04/25/22 (!) 146/66  03/12/22 (!) 135/48    Initial bp was elevated upon arrival but follow up was OK. Continue current meds.   Orders: -     Basic metabolic panel  Renal mass, left -     MR ABDOMEN W WO CONTRAST; Future  Stage 3 chronic kidney disease, unspecified whether stage 3a or 3b CKD (HCC) Assessment & Plan: Will refer to nephrology for further evaluation.   Orders: -     Ambulatory referral to Nephrology  Atrial fibrillation, unspecified type [facility]) Assessment & Plan: Rate stable, maintained on Eliquis. Followed by cardiology.    Cerebrovascular accident (CVA), unspecified mechanism (HCC) Assessment & Plan: Maintained on max tolerated statin dosing and Eliquis for secondary stroke prevention.  She has some chronic RUE pain since her stroke. Will increase her gabapentin to 100mg  3 x daily.  If no improvement at that dose advised may increase to 200mg  tid.   Gastroesophageal reflux disease, unspecified whether esophagitis present Assessment & Plan: Stable on Tagamet.    Prediabetes Assessment & Plan: Lab Results  Component Value Date   HGBA1C 5.3 03/12/2022      Depression, unspecified depression type Assessment & Plan: Stable on lexapro 20mg . Continue same.    Kidney lesion, native, left Assessment & Plan: Will obtain MRI for further evaluation.    Other orders -     Gabapentin; Take 2 capsules (200 mg total) by mouth 3 (three) times daily.  Dispense: 180 capsule; Refill: 3     I,Rachel Rivera,acting as a scribe for [name], NP.,have documented all relevant documentation on the behalf of [name], NP,as directed by  [name], NP while in the presence of [name], NP.   I, [name], NP, personally preformed the services described in this documentation.  All medical record entries made by the scribe were at my direction and in my presence.  I have reviewed the chart and discharge instructions (if applicable) and agree that the  record reflects my personal performance and is accurate and complete. 06/12/22   [name], NP

## 2022-06-12 NOTE — Assessment & Plan Note (Signed)
Lab Results  Component Value Date   CHOL 163 03/12/2022   HDL 42.40 03/12/2022   LDLCALC 96 05/19/2021   LDLDIRECT 82.0 03/12/2022   TRIG 268.0 (H) 03/12/2022   CHOLHDL 4 03/12/2022

## 2022-06-12 NOTE — Assessment & Plan Note (Signed)
Stable on Tagamet.

## 2022-06-12 NOTE — Assessment & Plan Note (Signed)
Lab Results  Component Value Date   HGBA1C 5.3 03/12/2022

## 2022-06-12 NOTE — Assessment & Plan Note (Addendum)
Maintained on max tolerated statin dosing and Eliquis for secondary stroke prevention.  She has some chronic RUE pain since her stroke. Will increase her gabapentin to 100mg  3 x daily.  If no improvement at that dose advised may increase to 200mg  tid.

## 2022-06-13 LAB — BASIC METABOLIC PANEL
BUN: 24 mg/dL — ABNORMAL HIGH (ref 6–23)
CO2: 28 mEq/L (ref 19–32)
Calcium: 9.1 mg/dL (ref 8.4–10.5)
Chloride: 98 mEq/L (ref 96–112)
Creatinine, Ser: 1.65 mg/dL — ABNORMAL HIGH (ref 0.40–1.20)
GFR: 27.78 mL/min — ABNORMAL LOW (ref >60.00)
Glucose, Bld: 101 mg/dL — ABNORMAL HIGH (ref 70–99)
Potassium: 4.4 mEq/L (ref 3.5–5.1)
Sodium: 134 mEq/L — ABNORMAL LOW (ref 135–145)

## 2022-06-15 ENCOUNTER — Other Ambulatory Visit: Payer: Self-pay | Admitting: Cardiology

## 2022-06-15 DIAGNOSIS — I4891 Unspecified atrial fibrillation: Secondary | ICD-10-CM

## 2022-06-15 NOTE — Telephone Encounter (Signed)
Eliquis 2.5mg  refill request received. Patient is 89 years old, weight-57.6kg, Crea-1.65 on 06/12/22, Diagnosis-Afib & CVA, and last seen by Dr. Jens Som on 04/25/22. Dose is appropriate based on dosing criteria. Will send in refill to requested pharmacy.

## 2022-06-20 ENCOUNTER — Ambulatory Visit (HOSPITAL_BASED_OUTPATIENT_CLINIC_OR_DEPARTMENT_OTHER)
Admission: RE | Admit: 2022-06-20 | Discharge: 2022-06-20 | Disposition: A | Discharge status: Home / Self Care | Payer: Medicare HMO | Source: Ambulatory Visit | Attending: Family | Admitting: Family

## 2022-06-20 DIAGNOSIS — N2889 Other specified disorders of kidney and ureter: Secondary | ICD-10-CM | POA: Diagnosis not present

## 2022-06-20 DIAGNOSIS — K862 Cyst of pancreas: Secondary | ICD-10-CM | POA: Diagnosis not present

## 2022-06-20 DIAGNOSIS — N281 Cyst of kidney, acquired: Secondary | ICD-10-CM | POA: Diagnosis not present

## 2022-06-20 MED ORDER — GADOBUTROL 1 MMOL/ML IV SOLN
5.7000 mL | Freq: Once | INTRAVENOUS | Status: AC | PRN
  Administered 2022-06-20: 5.7 mL via INTRAVENOUS

## 2022-06-21 ENCOUNTER — Encounter: Payer: Self-pay | Admitting: Family

## 2022-06-21 DIAGNOSIS — K862 Cyst of pancreas: Secondary | ICD-10-CM

## 2022-06-21 HISTORY — DX: Cyst of pancreas: K86.2

## 2022-07-20 ENCOUNTER — Other Ambulatory Visit: Payer: Self-pay | Admitting: Cardiology

## 2022-08-17 ENCOUNTER — Other Ambulatory Visit: Payer: Self-pay | Admitting: Cardiology

## 2022-08-31 ENCOUNTER — Other Ambulatory Visit: Payer: Self-pay | Admitting: Cardiology

## 2022-08-31 DIAGNOSIS — R0602 Shortness of breath: Secondary | ICD-10-CM

## 2022-09-07 ENCOUNTER — Other Ambulatory Visit: Payer: Self-pay | Admitting: Cardiology

## 2022-09-07 DIAGNOSIS — I10 Essential (primary) hypertension: Secondary | ICD-10-CM

## 2022-09-11 ENCOUNTER — Ambulatory Visit (INDEPENDENT_AMBULATORY_CARE_PROVIDER_SITE_OTHER): Payer: Medicare HMO | Admitting: Family

## 2022-09-11 VITALS — BP 136/62 | HR 75 | Temp 98.3°F | Resp 16 | Wt 128.0 lb

## 2022-09-11 DIAGNOSIS — N289 Disorder of kidney and ureter, unspecified: Secondary | ICD-10-CM | POA: Diagnosis not present

## 2022-09-11 DIAGNOSIS — I10 Essential (primary) hypertension: Secondary | ICD-10-CM

## 2022-09-11 DIAGNOSIS — N183 Chronic kidney disease, stage 3 unspecified: Secondary | ICD-10-CM

## 2022-09-11 DIAGNOSIS — K219 Gastro-esophageal reflux disease without esophagitis: Secondary | ICD-10-CM | POA: Diagnosis not present

## 2022-09-11 DIAGNOSIS — F32A Depression, unspecified: Secondary | ICD-10-CM | POA: Diagnosis not present

## 2022-09-11 DIAGNOSIS — I4891 Unspecified atrial fibrillation: Secondary | ICD-10-CM

## 2022-09-11 DIAGNOSIS — K862 Cyst of pancreas: Secondary | ICD-10-CM

## 2022-09-11 DIAGNOSIS — I639 Cerebral infarction, unspecified: Secondary | ICD-10-CM

## 2022-09-11 DIAGNOSIS — E785 Hyperlipidemia, unspecified: Secondary | ICD-10-CM | POA: Diagnosis not present

## 2022-09-11 MED ORDER — APIXABAN 2.5 MG PO TABS
2.5000 mg | ORAL_TABLET | Freq: Two times a day (BID) | ORAL | 0 refills | Status: DC
Start: 2022-09-11 — End: 2022-12-10

## 2022-09-11 NOTE — Assessment & Plan Note (Signed)
Blood pressure ok. Continue Losartan Potassium 100mg 

## 2022-09-11 NOTE — Assessment & Plan Note (Signed)
Stable on lexapro, continue same.  

## 2022-09-11 NOTE — Assessment & Plan Note (Signed)
Lab Results  Component Value Date   CHOL 163 03/12/2022   HDL 42.40 03/12/2022   LDLCALC 96 05/19/2021   LDLDIRECT 82.0 03/12/2022   TRIG 268.0 (H) 03/12/2022   CHOLHDL 4 03/12/2022   LDL looked stable continue crestor.

## 2022-09-11 NOTE — Assessment & Plan Note (Signed)
Continues eliquis, about to hit donut hole. Samples provided. Rate stable.

## 2022-09-11 NOTE — Assessment & Plan Note (Signed)
Patient is scheduled to establish with Nephrology next week.

## 2022-09-11 NOTE — Assessment & Plan Note (Signed)
Follow up scan for cyst found on pancreas in 2 years per recommendations

## 2022-09-11 NOTE — Progress Notes (Signed)
Subjective:     Patient ID: Lorraine Page, female    DOB: [birth year], 89 y.o.   MRN: [id number]  Chief Complaint  Patient presents with   Hypertension    Here for follow up    Hypertension Pertinent negatives include no chest pain, headaches, palpitations or shortness of breath.    Discussed the use of AI scribe software for clinical note transcription with the patient, who gave verbal consent to proceed.   89 year old female presents to the clinic today for follow up with her hypertension. Her blood pressure upon arrival was elevated and when rechecked after a few minutes blood pressure was stable. Patient is currently on Losartan Potassium 100mg . Patient states no issues with the medication. No headaches or dizziness noted. Spoke to patient and daughter regarding MRI results. Patient states that she has appointment with Nephrology next week for her kidney disease. Patient states that she is trying to do better with her diet given her high cholesterol levels last office visit. No concerns with her GERD at this time per patient.      Health Maintenance Due  Topic Date Due   Zoster Vaccines- Shingrix (1 of 2) 12/29/1951   Medicare Annual Wellness (AWV)  10/08/2020   COVID-19 Vaccine (6 - 2023-24 season) 02/14/2022   INFLUENZA VACCINE  09/06/2022    Past Medical History:  Diagnosis Date   Atrial fibrillation (HCC)    Chronic kidney disease, stage 3 (HCC)    CVA (cerebral vascular accident) (HCC)    GERD (gastroesophageal reflux disease)    GERD (gastroesophageal reflux disease)    Hyperlipidemia    Hypertension    Osteoarthritis    Pancreas cyst 06/21/2022   MRI notes: Numerous tiny fluid signal intensity lesions in the pancreas, with the largest lesion along the cephalad portion of the pancreatic head measuring 1.9 cm in long axis. Possibilities include dilated side ducts, postinflammatory cystic lesions, or intraductal papillary mucinous neoplasm. Based on lesion size  and patient age, follow pancreatic protocol MRI with and without co   Prediabetes     Past Surgical History:  Procedure Laterality Date   carpel tunnel Right    CHOLECYSTECTOMY     RHINOPLASTY     TOTAL HIP ARTHROPLASTY Right 08/12/2018   Procedure: RIGHT TOTAL HIP ARTHROPLASTY ANTERIOR APPROACH;  Surgeon: Kathryne Hitch, MD;  Location: [facility] OR;  Service: Orthopedics;  Laterality: Right;    Family History  Problem Relation Age of Onset   Cancer Mother    Stomach cancer Mother    CAD Father    Pneumonia Sister    Breast cancer Sister    Aortic stenosis Sister    Congestive Heart Failure Sister    Gastric cancer Brother     Social History   Socioeconomic History   Marital status: Widowed    Spouse name: Not on file   Number of children: 3   Years of education: Not on file   Highest education level: Not on file  Occupational History   Not on file  Tobacco Use   Smoking status: Former   Smokeless tobacco: Never  Vaping Use   Vaping status: Never Used  Substance and Sexual Activity   Alcohol use: Not Currently   Drug use: Never   Sexual activity: Not on file  Other Topics Concern   Not on file  Social History Narrative   Tobacco use, amount per day now: None   Has 2 sons and a daughter  Past tobacco use, amount per day:    How many years did you use tobacco: Quit 1979 ( maybe 20 years )   Alcohol use (drinks per week): None   Diet:   Do you drink/eat things with caffeine: Coffee   Marital status:    Widowed                              What year were you married? 49 years married in 1965   Do you live in a house, apartment, assisted living, condo, trailer, etc.? House   Is it one or more stories? 1   How many persons live in your home? 1   Do you have pets in your home?( please list) None.   Highest Level of education completed? High School 12th grade.   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   Do you exercise?    None                               Type and how often?   Do you have a living will? Yes   Do you have a DNR form?    No                               If not, do you want to discuss one?   Do you have signed POA/HPOA forms?  Yes                      If so, please bring to you appointment      Do you have any difficulty bathing or dressing yourself? No   Do you have any difficulty preparing food or eating? No   Do you have any difficulty managing your medications? No   Do you have any difficulty managing your finances? Yes   Do you have any difficulty affording your medications? Yes   Social Determinants of Health   Financial Resource Strain: Not on file  Food Insecurity: No Food Insecurity (11/18/2021)   Hunger Vital Sign    Worried About Running Out of Food in the Last Year: Never true    Ran Out of Food in the Last Year: Never true  Transportation Needs: No Transportation Needs (11/18/2021)   PRAPARE - Administrator, Civil Service (Medical): No    Lack of Transportation (Non-Medical): No  Physical Activity: Not on file  Stress: Not on file  Social Connections: Not on file  Intimate Partner Violence: Not At Risk (11/18/2021)   Humiliation, Afraid, Rape, and Kick questionnaire    Fear of Current or Ex-Partner: No    Emotionally Abused: No    Physically Abused: No    Sexually Abused: No    Outpatient Medications Prior to Visit  Medication Sig Dispense Refill   acetaminophen (TYLENOL) 650 MG CR tablet Take 1,300 mg by mouth 3 (three) times daily.     Calcium Carb-Cholecalciferol (CALCIUM 600+D3 PO) Take 1 tablet by mouth daily.     cimetidine (TAGAMET) 200 MG tablet Take 200 mg by mouth daily.      escitalopram (LEXAPRO) 20 MG tablet TAKE 1 TABLET BY MOUTH EVERY EVENING 90 tablet 0   furosemide (LASIX) 20 MG tablet TAKE 2 TABLETS (40 MG) BY MOUTH DAILY 180 tablet 2  gabapentin (NEURONTIN) 100 MG capsule Take 2 capsules (200 mg total) by mouth 3 (three) times daily. 180 capsule  3   hydrALAZINE (APRESOLINE) 100 MG tablet TAKE 1 TABLET BY MOUTH 3 TIMES DAILY 270 tablet 2   losartan (COZAAR) 100 MG tablet TAKE 1 TABLET BY MOUTH EVERY DAY 90 tablet 3   Multiple Vitamin (MULTIVITAMIN) tablet Take 1 tablet by mouth daily.     Probiotic Product (PROBIOTIC DAILY PO) Take 1 capsule by mouth daily.      rosuvastatin (CRESTOR) 10 MG tablet TAKE 1/2 TABLET BY MOUTH TWICE PER WEEK FOR CHOLESTEROL 30 tablet 3   Zoster Vaccine Adjuvanted [facility]) injection Inject 0.5mg  IM now and again in 2-6 months. 0.5 mL 0   apixaban (ELIQUIS) 2.5 MG TABS tablet TAKE 1 TABLET BY MOUTH 2 TIMES A DAY 180 tablet 1   No facility-administered medications prior to visit.    Allergies  Allergen Reactions   Penicillins Anaphylaxis and Swelling    Local swelling, throat swelling    Sulfa Antibiotics Anaphylaxis and Hives    Hives   Beta Adrenergic Blockers Other (See Comments)    bradycardia    Review of Systems  Constitutional:  Negative for chills and fever.  HENT:  Negative for ear discharge, ear pain and nosebleeds.   Eyes:  Negative for double vision and pain.  Respiratory:  Negative for cough, shortness of breath and wheezing.   Cardiovascular:  Negative for chest pain and palpitations.  Gastrointestinal:  Negative for diarrhea, heartburn and vomiting.  Genitourinary:  Negative for dysuria.  Musculoskeletal:  Negative for joint pain.  Skin:  Negative for itching and rash.  Neurological:  Negative for dizziness and headaches.  Psychiatric/Behavioral:  Negative for depression.        Objective:    Physical Exam HENT:     Head: Normocephalic.     Nose: Nose normal.  Cardiovascular:     Rate and Rhythm: Normal rate and regular rhythm.     Pulses: Normal pulses.  Abdominal:     General: Abdomen is flat.  Musculoskeletal:        General: Normal range of motion.     Cervical back: Normal range of motion.  Skin:    General: Skin is warm.  Neurological:     General: No  focal deficit present.     Mental Status: She is alert and oriented to person, place, and time. Mental status is at baseline.      BP 136/62   Pulse 75   Temp 98.3 F (36.8 C) (Oral)   Resp 16   Wt 128 lb (58.1 kg)   SpO2 99%   BMI 25.00 kg/m  Wt Readings from Last 3 Encounters:  09/11/22 128 lb (58.1 kg)  06/12/22 127 lb (57.6 kg)  04/25/22 127 lb 12.8 oz (58 kg)       Assessment & Plan:   Problem List Items Addressed This Visit       Unprioritized   Pancreas cyst    Follow up scan for cyst found on pancreas in 2 years per recommendations       Kidney lesion, native, left    MRI showed benign appearing cyst. No further follow up needed.       Hypertension    Blood pressure ok. Continue Losartan Potassium 100mg       Relevant Medications   apixaban (ELIQUIS) 2.5 MG TABS tablet   Hyperlipidemia    Lab Results  Component Value Date  CHOL 163 03/12/2022   HDL 42.40 03/12/2022   LDLCALC 96 05/19/2021   LDLDIRECT 82.0 03/12/2022   TRIG 268.0 (H) 03/12/2022   CHOLHDL 4 03/12/2022   LDL looked stable continue crestor.       Relevant Medications   apixaban (ELIQUIS) 2.5 MG TABS tablet   GERD (gastroesophageal reflux disease)    GERD is stable on medication. Continue Tagamet      Depression - Primary    Stable on lexapro, continue same.       CVA (cerebral vascular accident) (HCC)    Continue on Eliquis 2.5mg        Relevant Medications   apixaban (ELIQUIS) 2.5 MG TABS tablet   Chronic kidney disease, stage 3 (HCC)    Patient is scheduled to establish with Nephrology next week.       Atrial fibrillation (HCC)    Continues eliquis, about to hit donut hole. Samples provided. Rate stable.       Relevant Medications   apixaban (ELIQUIS) 2.5 MG TABS tablet    I have changed Lorraine Page's Eliquis to apixaban. I am also having her maintain her acetaminophen, multivitamin, Probiotic Product (PROBIOTIC DAILY PO), cimetidine, Calcium  Carb-Cholecalciferol (CALCIUM 600+D3 PO), losartan, Shingrix, gabapentin, rosuvastatin, escitalopram, furosemide, and hydrALAZINE.  Meds ordered this encounter  Medications   apixaban (ELIQUIS) 2.5 MG TABS tablet    Sig: Take 1 tablet (2.5 mg total) by mouth 2 (two) times daily.    Dispense:  64 tablet    Refill:  0    Order Specific Question:   Supervising Provider    Answer:   Danise Edge A [[id number]]

## 2022-09-11 NOTE — Assessment & Plan Note (Addendum)
MRI showed benign appearing cyst. No further follow up needed.

## 2022-09-11 NOTE — Assessment & Plan Note (Signed)
GERD is stable on medication. Continue Tagamet

## 2022-09-11 NOTE — Assessment & Plan Note (Signed)
Continue on Eliquis 2.5mg 

## 2022-09-19 DIAGNOSIS — N1832 Chronic kidney disease, stage 3b: Secondary | ICD-10-CM | POA: Diagnosis not present

## 2022-09-19 DIAGNOSIS — I69359 Hemiplegia and hemiparesis following cerebral infarction affecting unspecified side: Secondary | ICD-10-CM | POA: Diagnosis not present

## 2022-09-19 DIAGNOSIS — I129 Hypertensive chronic kidney disease with stage 1 through stage 4 chronic kidney disease, or unspecified chronic kidney disease: Secondary | ICD-10-CM | POA: Diagnosis not present

## 2022-10-18 NOTE — Progress Notes (Deleted)
HPI: FU atrial fibrillation and chronic diastolic congestive heart failure.  Patient was off of Eliquis for hip injection in January 2019 and suffered an embolic CVA. She has some gait instability from previous CVA.  Monitor December 2020 showed sinus bradycardia with occasional PAC, PVC, junctional rhythm and occasional pauses with longest being 4.7 seconds.  Metoprolol discontinued. Echocardiogram repeated July 2023 and showed normal LV function, severe left atrial enlargement, mild mitral regurgitation.  Since last seen,    Current Outpatient Medications  Medication Sig Dispense Refill   acetaminophen (TYLENOL) 650 MG CR tablet Take 1,300 mg by mouth 3 (three) times daily.     apixaban (ELIQUIS) 2.5 MG TABS tablet Take 1 tablet (2.5 mg total) by mouth 2 (two) times daily. 64 tablet 0   Calcium Carb-Cholecalciferol (CALCIUM 600+D3 PO) Take 1 tablet by mouth daily.     cimetidine (TAGAMET) 200 MG tablet Take 200 mg by mouth daily.      escitalopram (LEXAPRO) 20 MG tablet TAKE 1 TABLET BY MOUTH EVERY EVENING 90 tablet 0   furosemide (LASIX) 20 MG tablet TAKE 2 TABLETS (40 MG) BY MOUTH DAILY 180 tablet 2   gabapentin (NEURONTIN) 100 MG capsule Take 2 capsules (200 mg total) by mouth 3 (three) times daily. 180 capsule 3   hydrALAZINE (APRESOLINE) 100 MG tablet TAKE 1 TABLET BY MOUTH 3 TIMES DAILY 270 tablet 2   losartan (COZAAR) 100 MG tablet TAKE 1 TABLET BY MOUTH EVERY DAY 90 tablet 3   Multiple Vitamin (MULTIVITAMIN) tablet Take 1 tablet by mouth daily.     Probiotic Product (PROBIOTIC DAILY PO) Take 1 capsule by mouth daily.      rosuvastatin (CRESTOR) 10 MG tablet TAKE 1/2 TABLET BY MOUTH TWICE PER WEEK FOR CHOLESTEROL 30 tablet 3   Zoster Vaccine Adjuvanted [facility]) injection Inject 0.5mg  IM now and again in 2-6 months. 0.5 mL 0   No current facility-administered medications for this visit.     Past Medical History:  Diagnosis Date   Atrial fibrillation (HCC)    Chronic kidney  disease, stage 3 (HCC)    CVA (cerebral vascular accident) (HCC)    GERD (gastroesophageal reflux disease)    GERD (gastroesophageal reflux disease)    Hyperlipidemia    Hypertension    Osteoarthritis    Pancreas cyst 06/21/2022   MRI notes: Numerous tiny fluid signal intensity lesions in the pancreas, with the largest lesion along the cephalad portion of the pancreatic head measuring 1.9 cm in long axis. Possibilities include dilated side ducts, postinflammatory cystic lesions, or intraductal papillary mucinous neoplasm. Based on lesion size and patient age, follow pancreatic protocol MRI with and without co   Prediabetes     Past Surgical History:  Procedure Laterality Date   carpel tunnel Right    CHOLECYSTECTOMY     RHINOPLASTY     TOTAL HIP ARTHROPLASTY Right 08/12/2018   Procedure: RIGHT TOTAL HIP ARTHROPLASTY ANTERIOR APPROACH;  Surgeon: Kathryne Hitch, MD;  Location: [facility] OR;  Service: Orthopedics;  Laterality: Right;    Social History   Socioeconomic History   Marital status: Widowed    Spouse name: Not on file   Number of children: 3   Years of education: Not on file   Highest education level: Not on file  Occupational History   Not on file  Tobacco Use   Smoking status: Former   Smokeless tobacco: Never  Vaping Use   Vaping status: Never Used  Substance and Sexual  Activity   Alcohol use: Not Currently   Drug use: Never   Sexual activity: Not on file  Other Topics Concern   Not on file  Social History Narrative   Tobacco use, amount per day now: None   Has 2 sons and a daughter    Past tobacco use, amount per day:    How many years did you use tobacco: Quit 1979 ( maybe 20 years )   Alcohol use (drinks per week): None   Diet:   Do you drink/eat things with caffeine: Coffee   Marital status:    Widowed                              What year were you married? 49 years married in 1965   Do you live in a house, apartment, assisted living, condo,  trailer, etc.? House   Is it one or more stories? 1   How many persons live in your home? 1   Do you have pets in your home?( please list) None.   Highest Level of education completed? High School 12th grade.   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   Do you exercise?    None                              Type and how often?   Do you have a living will? Yes   Do you have a DNR form?    No                               If not, do you want to discuss one?   Do you have signed POA/HPOA forms?  Yes                      If so, please bring to you appointment      Do you have any difficulty bathing or dressing yourself? No   Do you have any difficulty preparing food or eating? No   Do you have any difficulty managing your medications? No   Do you have any difficulty managing your finances? Yes   Do you have any difficulty affording your medications? Yes   Social Determinants of Health   Financial Resource Strain: Not on file  Food Insecurity: No Food Insecurity (11/18/2021)   Hunger Vital Sign    Worried About Running Out of Food in the Last Year: Never true    Ran Out of Food in the Last Year: Never true  Transportation Needs: No Transportation Needs (11/18/2021)   PRAPARE - Administrator, Civil Service (Medical): No    Lack of Transportation (Non-Medical): No  Physical Activity: Not on file  Stress: Not on file  Social Connections: Not on file  Intimate Partner Violence: Not At Risk (11/18/2021)   Humiliation, Afraid, Rape, and Kick questionnaire    Fear of Current or Ex-Partner: No    Emotionally Abused: No    Physically Abused: No    Sexually Abused: No    Family History  Problem Relation Age of Onset   Cancer Mother    Stomach cancer Mother    CAD Father    Pneumonia Sister    Breast cancer Sister    Aortic stenosis Sister    Congestive  Heart Failure Sister    Gastric cancer Brother     ROS: no fevers or chills, productive  cough, hemoptysis, dysphasia, odynophagia, melena, hematochezia, dysuria, hematuria, rash, seizure activity, orthopnea, PND, pedal edema, claudication. Remaining systems are negative.  Physical Exam: Well-developed well-nourished in no acute distress.  Skin is warm and dry.  HEENT is normal.  Neck is supple.  Chest is clear to auscultation with normal expansion.  Cardiovascular exam is regular rate and rhythm.  Abdominal exam nontender or distended. No masses palpated. Extremities show no edema. neuro grossly intact  ECG- personally reviewed  A/P  1 paroxysmal atrial fibrillation-patient is in sinus rhythm today.  No beta-blocker was discontinued previously due to bradycardia.  Continue apixaban.  Patient suffered an embolic CVA when off of anticoagulation previously.  2 chronic diastolic congestive heart failure-patient remains euvolemic on examination.  Continue Lasix at present dose.  3 hypertension-patient's blood pressure is can continue present medical regimen.  4 hyperlipidemia-continue statin.  5 pulmonary hypertension-this is felt secondary to pulmonary venous hypertension.  Will continue Lasix.  Olga Millers, MD

## 2022-10-24 ENCOUNTER — Ambulatory Visit: Payer: Medicare HMO | Admitting: Cardiology

## 2022-11-16 ENCOUNTER — Other Ambulatory Visit: Payer: Self-pay | Admitting: Cardiology

## 2022-12-10 ENCOUNTER — Other Ambulatory Visit: Payer: Self-pay | Admitting: Cardiology

## 2022-12-10 DIAGNOSIS — I4891 Unspecified atrial fibrillation: Secondary | ICD-10-CM

## 2022-12-10 NOTE — Telephone Encounter (Signed)
Prescription refill request for Eliquis received. Indication:afib Last office visit:3/24 Scr:1.65  5/24 Age: 89 Weight:58.1  kg  Prescription refilled

## 2022-12-14 ENCOUNTER — Ambulatory Visit: Payer: Medicare HMO | Admitting: Family

## 2022-12-21 ENCOUNTER — Ambulatory Visit: Payer: Medicare HMO | Admitting: Family

## 2023-01-07 ENCOUNTER — Other Ambulatory Visit: Payer: Self-pay | Admitting: Cardiology

## 2023-01-07 DIAGNOSIS — I10 Essential (primary) hypertension: Secondary | ICD-10-CM

## 2023-02-01 NOTE — Progress Notes (Signed)
 HPI: FU atrial fibrillation.  Patient was off of Eliquis  for hip injection in January 2019 and suffered an embolic CVA. She has some gait instability from previous CVA.  Monitor December 2020 showed sinus bradycardia with occasional PAC, PVC, junctional rhythm and occasional pauses with longest being 4.7 seconds.  Metoprolol  discontinued. Patient felt to have acute on chronic diastolic congestive heart failure.  Echocardiogram repeated July 2023 and showed normal LV function, severe left atrial enlargement, mild mitral regurgitation.  Patient given Lasix  with some improvement. Abdominal MRI May 2024 showed densities in the pancreas and follow-up recommended in 2 years.  Since last seen, she denies dyspnea, chest pain, palpitations or syncope.  Current Outpatient Medications  Medication Sig Dispense Refill   acetaminophen  (TYLENOL ) 650 MG CR tablet Take 1,300 mg by mouth 3 (three) times daily.     apixaban  (ELIQUIS ) 2.5 MG TABS tablet TAKE 1 TABLET BY MOUTH 2 TIMES A DAY 180 tablet 1   Calcium  Carb-Cholecalciferol (CALCIUM  600+D3 PO) Take 1 tablet by mouth daily.     cimetidine (TAGAMET) 200 MG tablet Take 200 mg by mouth daily.      escitalopram  (LEXAPRO ) 20 MG tablet TAKE 1 TABLET BY MOUTH EVERY EVENING 90 tablet 1   furosemide  (LASIX ) 20 MG tablet TAKE 2 TABLETS (40 MG) BY MOUTH DAILY 180 tablet 2   gabapentin  (NEURONTIN ) 100 MG capsule Take 2 capsules (200 mg total) by mouth 3 (three) times daily. 180 capsule 3   hydrALAZINE  (APRESOLINE ) 100 MG tablet TAKE 1 TABLET BY MOUTH 3 TIMES DAILY 270 tablet 2   losartan  (COZAAR ) 100 MG tablet TAKE 1 TABLET BY MOUTH EVERY DAY 90 tablet 3   Multiple Vitamin (MULTIVITAMIN) tablet Take 1 tablet by mouth daily.     Probiotic Product (PROBIOTIC DAILY PO) Take 1 capsule by mouth daily.      rosuvastatin  (CRESTOR ) 10 MG tablet TAKE 1/2 TABLET BY MOUTH TWICE PER WEEK FOR CHOLESTEROL 30 tablet 3   Zoster Vaccine Adjuvanted (SHINGRIX ) injection Inject 0.5mg   IM now and again in 2-6 months. 0.5 mL 0   No current facility-administered medications for this visit.     Past Medical History:  Diagnosis Date   Atrial fibrillation (HCC)    Chronic kidney disease, stage 3 (HCC)    CVA (cerebral vascular accident) (HCC)    GERD (gastroesophageal reflux disease)    GERD (gastroesophageal reflux disease)    Hyperlipidemia    Hypertension    Osteoarthritis    Pancreas cyst 06/21/2022   MRI notes: Numerous tiny fluid signal intensity lesions in the pancreas, with the largest lesion along the cephalad portion of the pancreatic head measuring 1.9 cm in long axis. Possibilities include dilated side ducts, postinflammatory cystic lesions, or intraductal papillary mucinous neoplasm. Based on lesion size and patient age, follow pancreatic protocol MRI with and without co   Prediabetes     Past Surgical History:  Procedure Laterality Date   carpel tunnel Right    CHOLECYSTECTOMY     RHINOPLASTY     TOTAL HIP ARTHROPLASTY Right 08/12/2018   Procedure: RIGHT TOTAL HIP ARTHROPLASTY ANTERIOR APPROACH;  Surgeon: Vernetta Lonni GRADE, MD;  Location: [facility] OR;  Service: Orthopedics;  Laterality: Right;    Social History   Socioeconomic History   Marital status: Widowed    Spouse name: Not on file   Number of children: 3   Years of education: Not on file   Highest education level: Not on file  Occupational History  Not on file  Tobacco Use   Smoking status: Former   Smokeless tobacco: Never  Vaping Use   Vaping status: Never Used  Substance and Sexual Activity   Alcohol use: Not Currently   Drug use: Never   Sexual activity: Not on file  Other Topics Concern   Not on file  Social History Narrative   Tobacco use, amount per day now: None   Has 2 sons and a daughter    Past tobacco use, amount per day:    How many years did you use tobacco: Quit 1979 ( maybe 20 years )   Alcohol use (drinks per week): None   Diet:   Do you drink/eat things  with caffeine: Coffee   Marital status:    Widowed                              What year were you married? 49 years married in 1965   Do you live in a house, apartment, assisted living, condo, trailer, etc.? House   Is it one or more stories? 1   How many persons live in your home? 1   Do you have pets in your home?( please list) None.   Highest Level of education completed? High School 12th grade.   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   Do you exercise?    None                              Type and how often?   Do you have a living will? Yes   Do you have a DNR form?    No                               If not, do you want to discuss one?   Do you have signed POA/HPOA forms?  Yes                      If so, please bring to you appointment      Do you have any difficulty bathing or dressing yourself? No   Do you have any difficulty preparing food or eating? No   Do you have any difficulty managing your medications? No   Do you have any difficulty managing your finances? Yes   Do you have any difficulty affording your medications? Yes   Social Drivers of Corporate Investment Banker Strain: Not on file  Food Insecurity: No Food Insecurity (11/18/2021)   Hunger Vital Sign    Worried About Running Out of Food in the Last Year: Never true    Ran Out of Food in the Last Year: Never true  Transportation Needs: No Transportation Needs (11/18/2021)   PRAPARE - Administrator, Civil Service (Medical): No    Lack of Transportation (Non-Medical): No  Physical Activity: Not on file  Stress: Not on file  Social Connections: Not on file  Intimate Partner Violence: Not At Risk (11/18/2021)   Humiliation, Afraid, Rape, and Kick questionnaire    Fear of Current or Ex-Partner: No    Emotionally Abused: No    Physically Abused: No    Sexually Abused: No    Family History  Problem Relation Age of Onset   Cancer Mother  Stomach cancer Mother    CAD  Father    Pneumonia Sister    Breast cancer Sister    Aortic stenosis Sister    Congestive Heart Failure Sister    Gastric cancer Brother     ROS: no fevers or chills, productive cough, hemoptysis, dysphasia, odynophagia, melena, hematochezia, dysuria, hematuria, rash, seizure activity, orthopnea, PND, pedal edema, claudication. Remaining systems are negative.  Physical Exam: Well-developed well-nourished in no acute distress.  Skin is warm and dry.  HEENT is normal.  Neck is supple.  Chest is clear to auscultation with normal expansion.  Cardiovascular exam is regular rate and rhythm.  Abdominal exam nontender or distended. No masses palpated. Extremities show no edema. neuro grossly intact  EKG Interpretation Date/Time:  Wednesday February 13 2023 11:28:11 EST Ventricular Rate:  78 PR Interval:  192 QRS Duration:  74 QT Interval:  374 QTC Calculation: 426 R Axis:   40  Text Interpretation: Normal sinus rhythm Nonspecific ST abnormality When compared with ECG of 17-Nov-2021 20:35, No significant change was found Confirmed by Pietro Rogue ([id number]) on 02/13/2023 11:30:34 AM     A/P  1 chronic diastolic congestive heart failure-she appears to be reasonably well compensated.  Continue diuretic at present.  Check potassium and renal function.  2 paroxysmal atrial fibrillation-she is in sinus rhythm today.  Beta-blocker was discontinued previously due to bradycardia.  Will continue apixaban .  Plan check hemoglobin and renal function.  Patient has fallen in the past.  We discussed the potential for Watchman device to discontinue anticoagulation long-term but she declined.  She will discuss this further with her family.  3 hypertension-blood pressure elevated; however typically controlled.  Continue present medications and follow-up.  4 hyperlipidemia-continue statin.  Check lipids and liver.  5 pulmonary hypertension-this is felt likely secondary to pulmonary venous hypertension.   Will continue Lasix  at present dose.  6 pancreatic lesion-she will need follow-up MRI May 2026.  Rogue Pietro, MD

## 2023-02-13 ENCOUNTER — Encounter: Payer: Self-pay | Admitting: Cardiology

## 2023-02-13 ENCOUNTER — Ambulatory Visit: Payer: Medicare HMO | Attending: Cardiology | Admitting: Cardiology

## 2023-02-13 VITALS — BP 183/67 | HR 78 | Ht 60.0 in | Wt 125.0 lb

## 2023-02-13 DIAGNOSIS — I5032 Chronic diastolic (congestive) heart failure: Secondary | ICD-10-CM

## 2023-02-13 DIAGNOSIS — I480 Paroxysmal atrial fibrillation: Secondary | ICD-10-CM | POA: Diagnosis not present

## 2023-02-13 DIAGNOSIS — E7800 Pure hypercholesterolemia, unspecified: Secondary | ICD-10-CM | POA: Diagnosis not present

## 2023-02-13 NOTE — Patient Instructions (Signed)
   Follow-Up: At [facility], you and your health needs are our priority.  As part of our continuing mission to provide you with exceptional heart care, we have created designated Provider Care Teams.  These Care Teams include your primary Cardiologist (physician) and Advanced Practice Providers (APPs -  Physician Assistants and Nurse Practitioners) who all work together to provide you with the care you need, when you need it.    Your next appointment:   6 month(s)  Provider:   Olga Millers, MD

## 2023-02-25 ENCOUNTER — Other Ambulatory Visit: Payer: Self-pay | Admitting: Family

## 2023-03-25 DIAGNOSIS — N179 Acute kidney failure, unspecified: Secondary | ICD-10-CM | POA: Diagnosis not present

## 2023-03-25 DIAGNOSIS — K5730 Diverticulosis of large intestine without perforation or abscess without bleeding: Secondary | ICD-10-CM | POA: Diagnosis not present

## 2023-03-25 DIAGNOSIS — E860 Dehydration: Secondary | ICD-10-CM | POA: Diagnosis not present

## 2023-03-25 DIAGNOSIS — R197 Diarrhea, unspecified: Secondary | ICD-10-CM | POA: Diagnosis not present

## 2023-03-25 DIAGNOSIS — I700 Atherosclerosis of aorta: Secondary | ICD-10-CM | POA: Diagnosis not present

## 2023-03-25 DIAGNOSIS — M79604 Pain in right leg: Secondary | ICD-10-CM | POA: Diagnosis not present

## 2023-03-25 DIAGNOSIS — R531 Weakness: Secondary | ICD-10-CM | POA: Diagnosis not present

## 2023-03-25 DIAGNOSIS — R55 Syncope and collapse: Secondary | ICD-10-CM | POA: Diagnosis not present

## 2023-03-25 DIAGNOSIS — R509 Fever, unspecified: Secondary | ICD-10-CM | POA: Diagnosis not present

## 2023-03-25 DIAGNOSIS — I517 Cardiomegaly: Secondary | ICD-10-CM | POA: Diagnosis not present

## 2023-03-25 DIAGNOSIS — K439 Ventral hernia without obstruction or gangrene: Secondary | ICD-10-CM | POA: Diagnosis not present

## 2023-03-25 DIAGNOSIS — M79601 Pain in right arm: Secondary | ICD-10-CM | POA: Diagnosis not present

## 2023-03-25 DIAGNOSIS — I69351 Hemiplegia and hemiparesis following cerebral infarction affecting right dominant side: Secondary | ICD-10-CM | POA: Diagnosis not present

## 2023-03-25 DIAGNOSIS — E871 Hypo-osmolality and hyponatremia: Secondary | ICD-10-CM | POA: Diagnosis not present

## 2023-03-25 DIAGNOSIS — R109 Unspecified abdominal pain: Secondary | ICD-10-CM | POA: Diagnosis not present

## 2023-03-25 DIAGNOSIS — I959 Hypotension, unspecified: Secondary | ICD-10-CM | POA: Diagnosis not present

## 2023-03-25 DIAGNOSIS — I6782 Cerebral ischemia: Secondary | ICD-10-CM | POA: Diagnosis not present

## 2023-03-25 DIAGNOSIS — R200 Anesthesia of skin: Secondary | ICD-10-CM | POA: Diagnosis not present

## 2023-03-25 DIAGNOSIS — D649 Anemia, unspecified: Secondary | ICD-10-CM | POA: Diagnosis not present

## 2023-03-26 DIAGNOSIS — R531 Weakness: Secondary | ICD-10-CM | POA: Diagnosis not present

## 2023-03-26 DIAGNOSIS — R197 Diarrhea, unspecified: Secondary | ICD-10-CM | POA: Diagnosis not present

## 2023-03-26 DIAGNOSIS — N179 Acute kidney failure, unspecified: Secondary | ICD-10-CM | POA: Diagnosis not present

## 2023-03-26 DIAGNOSIS — E860 Dehydration: Secondary | ICD-10-CM | POA: Diagnosis not present

## 2023-03-26 DIAGNOSIS — D649 Anemia, unspecified: Secondary | ICD-10-CM | POA: Diagnosis not present

## 2023-03-31 ENCOUNTER — Encounter: Payer: Self-pay | Admitting: Cardiology

## 2023-03-31 DIAGNOSIS — R0602 Shortness of breath: Secondary | ICD-10-CM

## 2023-04-03 ENCOUNTER — Encounter: Payer: Self-pay | Admitting: Family

## 2023-04-03 ENCOUNTER — Other Ambulatory Visit: Payer: Self-pay | Admitting: Cardiology

## 2023-04-03 DIAGNOSIS — I10 Essential (primary) hypertension: Secondary | ICD-10-CM

## 2023-04-08 MED ORDER — FUROSEMIDE 20 MG PO TABS
ORAL_TABLET | ORAL | Status: DC
Start: 2023-04-08 — End: 2023-08-07

## 2023-04-10 ENCOUNTER — Other Ambulatory Visit: Payer: Self-pay | Admitting: Family

## 2023-04-19 DIAGNOSIS — R0602 Shortness of breath: Secondary | ICD-10-CM | POA: Diagnosis not present

## 2023-04-20 LAB — BASIC METABOLIC PANEL
BUN/Creatinine Ratio: 15 (ref 12–28)
BUN: 20 mg/dL (ref 8–27)
CO2: 20 mmol/L (ref 20–29)
Calcium: 8.8 mg/dL (ref 8.7–10.3)
Chloride: 92 mmol/L — ABNORMAL LOW (ref 96–106)
Creatinine, Ser: 1.32 mg/dL — ABNORMAL HIGH (ref 0.57–1.00)
Glucose: 92 mg/dL (ref 70–99)
Potassium: 4.3 mmol/L (ref 3.5–5.2)
Sodium: 130 mmol/L — ABNORMAL LOW (ref 134–144)
eGFR: 39 mL/min/{1.73_m2} — ABNORMAL LOW (ref >59)

## 2023-04-22 ENCOUNTER — Encounter: Payer: Self-pay | Admitting: *Deleted

## 2023-05-01 ENCOUNTER — Telehealth: Payer: Self-pay | Admitting: Family

## 2023-05-01 NOTE — Telephone Encounter (Signed)
 Spoke to pt's daughter Olegario Messier. She states that pt has had pain off/on for some time in the umbilical hernia.  More pain recently. She is eating/drinking and moving her bowels. Has appointment scheduled for Friday. Advised daughter to keep upcoming appointment but to bring pt to the ED if severe/worsening abdominal pain, nausea/vomitting/change in bowel pattern. She verbalizes understanding.

## 2023-05-03 ENCOUNTER — Ambulatory Visit (INDEPENDENT_AMBULATORY_CARE_PROVIDER_SITE_OTHER): Admitting: Family

## 2023-05-03 VITALS — BP 139/44 | HR 75 | Temp 98.6°F | Resp 16 | Ht 60.0 in | Wt 124.0 lb

## 2023-05-03 DIAGNOSIS — K429 Umbilical hernia without obstruction or gangrene: Secondary | ICD-10-CM | POA: Diagnosis not present

## 2023-05-03 DIAGNOSIS — R739 Hyperglycemia, unspecified: Secondary | ICD-10-CM | POA: Diagnosis not present

## 2023-05-03 NOTE — Progress Notes (Addendum)
   Established Patient Office Visit  Subjective   Patient ID: Lorraine Page, female    DOB: July 14, 1932  Age: 89 y.o. MRN: [id number]  Shingrix, flu, DEXA  Pleasant 89 yo patient accompanied by her daughter for evaluation of a presumed hernia. States she has had an umbilical hernia for years but has just started experiencing pain in the past 2 weeks. Patient's daughter reports that her brother who is a PA reduced the hernia which provided the patient with some pain relief. The patient denies nausea, vomiting, diarrhea, or constipation.   ROS See HPI   Objective:     See documented vital signs.    Physical Exam Vitals reviewed.  Constitutional:      General: She is not in acute distress.    Appearance: Normal appearance. She is not ill-appearing.  HENT:     Head: Normocephalic and atraumatic.     Right Ear: External ear normal.     Left Ear: External ear normal.  Cardiovascular:     Rate and Rhythm: Normal rate and regular rhythm.     Pulses: Normal pulses.     Heart sounds: Normal heart sounds.  Pulmonary:     Effort: Pulmonary effort is normal.     Breath sounds: Normal breath sounds.  Abdominal:     General: Bowel sounds are normal.     Palpations: Abdomen is soft.     Tenderness: There is no abdominal tenderness.     Hernia: A hernia is present.     Comments: Umbilical hernia present. Soft to palpation.   Musculoskeletal:     Right lower leg: 1+ Pitting Edema present.     Left lower leg: 1+ Pitting Edema present.  Skin:    General: Skin is warm and dry.     Capillary Refill: Capillary refill takes less than 2 seconds.  Neurological:     Mental Status: She is alert and oriented to person, place, and time.  Psychiatric:        Mood and Affect: Mood normal.        Behavior: Behavior normal.      Assessment & Plan:   -Umbilical hernia - stable at this time. No concern for strangulation. Will refer to general surgery for evaluation.   -Hypertension - stable  on hydralazine and losartan.  -GERD - stable on cimetidine  -Hyperglycemia - repeat A1C today   -Depression - reports mood is stable on escitalopram   Cristopher Peru, RN

## 2023-05-03 NOTE — Assessment & Plan Note (Signed)
  Chronic umbilical hernia with recent soreness. Easily Reducible, no bowel obstruction. Discussed signs/symptoms or strangulated hernia. Surgical consultation advised to discuss possible elective repair.   - Refer to general surgery for evaluation. - Instruct to seek emergency care if nausea, vomiting, abdominal pain occur.

## 2023-05-03 NOTE — Patient Instructions (Addendum)
 VISIT SUMMARY:  During today's visit, we discussed your recent discomfort from your umbilical hernia, mild swelling in your ankles, and slightly elevated blood sugar levels.   YOUR PLAN:  -UMBILICAL HERNIA: An umbilical hernia occurs when part of the intestine protrudes through an opening in the abdominal muscles near the belly button. Your hernia is chronic but has recently become sore. It is reducible, meaning it can be pushed back in, and there is no bowel obstruction. We discussed the risks of strangulation, which could require emergency surgery. You are advised to see a general surgeon for further evaluation. If you experience nausea, vomiting, or worsening abdominal pain, seek emergency care immediately.  -PERIPHERAL EDEMA: Peripheral edema is swelling caused by fluid retention, often in the lower legs and ankles. Your mild swelling is likely due to the reduced dosage of Lasix, a diuretic medication. It is important to monitor your weight daily to track fluid retention and prevent kidney damage. Watch for any significant weight gain.  -DIABETES SCREENING: We are concerned about your slightly elevated blood sugar levels, which could indicate diabetes. A blood test will be ordered to screen for diabetes and assess your blood sugar control.  INSTRUCTIONS:  Please follow up with a general surgeon for your umbilical hernia evaluation. Continue to monitor your weight daily and watch for any significant changes. We will order a blood test to screen for diabetes. Attempt to collect a stool sample for the pancreatic enzyme test using the provided kit, and clean the container if it gets contaminated.

## 2023-05-03 NOTE — Progress Notes (Signed)
 Subjective:     Patient ID: Lorraine Page, female    DOB: 10-Nov-1932, 89 y.o.   MRN: [id number]  Chief Complaint  Patient presents with   Umbilical Hernia    Patient complains of umbilical hernia, "hurting in the past few weeks"    HPI  Discussed the use of AI scribe software for clinical note transcription with the patient, who gave verbal consent to proceed.  History of Present Illness  Lorraine Page is an 89 year old female who presents with recent exacerbation of umbilical hernia symptoms. She is accompanied by her son, Jorja Loa, who is a Advice worker.  She has a longstanding umbilical hernia that has recently started causing discomfort, with soreness around the umbilical area becoming more noticeable over the past few weeks. The hernia protrudes more when standing for extended periods or after straining or lifting heavy objects. Her son was able to reduce the hernia when it protruded recently, and she did not experience pain during this process. No nausea, vomiting, or changes in bowel movements, and she has been eating and drinking without issues.  She takes Lasix, which was reduced to once daily after a recent hospitalization due to dehydration. Mild swelling in her ankles is noted, which she attributes to her socks. She monitors her weight daily to track any fluid retention.  There is concern about slightly elevated blood sugar levels recently, and a blood test for diabetes is planned to further investigate this issue.  A previous attempt to test her pancreatic enzymes was unsuccessful due to sample contamination. She has arthritis, which makes handling small specimen containers challenging.     Health Maintenance Due  Topic Date Due   Zoster Vaccines- Shingrix (1 of 2) 12/29/1951   Medicare Annual Wellness (AWV)  10/08/2020   INFLUENZA VACCINE  09/06/2022   COVID-19 Vaccine (6 - 2024-25 season) 10/07/2022    Past Medical History:  Diagnosis Date   Atrial  fibrillation (HCC)    Chronic kidney disease, stage 3 (HCC)    CVA (cerebral vascular accident) (HCC)    GERD (gastroesophageal reflux disease)    GERD (gastroesophageal reflux disease)    Hyperlipidemia    Hypertension    Osteoarthritis    Pancreas cyst 06/21/2022   MRI notes: Numerous tiny fluid signal intensity lesions in the pancreas, with the largest lesion along the cephalad portion of the pancreatic head measuring 1.9 cm in long axis. Possibilities include dilated side ducts, postinflammatory cystic lesions, or intraductal papillary mucinous neoplasm. Based on lesion size and patient age, follow pancreatic protocol MRI with and without co   Prediabetes     Past Surgical History:  Procedure Laterality Date   carpel tunnel Right    CHOLECYSTECTOMY     RHINOPLASTY     TOTAL HIP ARTHROPLASTY Right 08/12/2018   Procedure: RIGHT TOTAL HIP ARTHROPLASTY ANTERIOR APPROACH;  Surgeon: Kathryne Hitch, MD;  Location: [facility] OR;  Service: Orthopedics;  Laterality: Right;    Family History  Problem Relation Age of Onset   Cancer Mother    Stomach cancer Mother    CAD Father    Pneumonia Sister    Breast cancer Sister    Aortic stenosis Sister    Congestive Heart Failure Sister    Gastric cancer Brother     Social History   Socioeconomic History   Marital status: Widowed    Spouse name: Not on file   Number of children: 3   Years of education: Not on file  Highest education level: Not on file  Occupational History   Not on file  Tobacco Use   Smoking status: Former   Smokeless tobacco: Never  Vaping Use   Vaping status: Never Used  Substance and Sexual Activity   Alcohol use: Not Currently   Drug use: Never   Sexual activity: Not on file  Other Topics Concern   Not on file  Social History Narrative   Tobacco use, amount per day now: None   Has 2 sons and a daughter    Past tobacco use, amount per day:    How many years did you use tobacco: Quit 1979 ( maybe 20  years )   Alcohol use (drinks per week): None   Diet:   Do you drink/eat things with caffeine: Coffee   Marital status:    Widowed                              What year were you married? 49 years married in 1965   Do you live in a house, apartment, assisted living, condo, trailer, etc.? House   Is it one or more stories? 1   How many persons live in your home? 1   Do you have pets in your home?( please list) None.   Highest Level of education completed? High School 12th grade.   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   Do you exercise?    None                              Type and how often?   Do you have a living will? Yes   Do you have a DNR form?    No                               If not, do you want to discuss one?   Do you have signed POA/HPOA forms?  Yes                      If so, please bring to you appointment      Do you have any difficulty bathing or dressing yourself? No   Do you have any difficulty preparing food or eating? No   Do you have any difficulty managing your medications? No   Do you have any difficulty managing your finances? Yes   Do you have any difficulty affording your medications? Yes   Social Drivers of Corporate investment banker Strain: Not on file  Food Insecurity: Low Risk  (03/25/2023)   Received from [facility]   Hunger Vital Sign    Worried About Running Out of Food in the Last Year: Never true    Ran Out of Food in the Last Year: Never true  Transportation Needs: No Transportation Needs (03/25/2023)   Received from [facility]    In the past 12 months, has lack of reliable transportation kept you from medical appointments, meetings, work or from getting things needed for daily living? : No  Physical Activity: Not on file  Stress: Not on file  Social Connections: Not on file  Intimate Partner Violence: Not At Risk (11/18/2021)   Humiliation, Afraid, Rape, and Kick questionnaire    Fear of  Current or Ex-Partner: No  Emotionally Abused: No    Physically Abused: No    Sexually Abused: No    Outpatient Medications Prior to Visit  Medication Sig Dispense Refill   acetaminophen (TYLENOL) 650 MG CR tablet Take 1,300 mg by mouth 3 (three) times daily.     apixaban (ELIQUIS) 2.5 MG TABS tablet TAKE 1 TABLET BY MOUTH 2 TIMES A DAY 180 tablet 1   Calcium Carb-Cholecalciferol (CALCIUM 600+D3 PO) Take 1 tablet by mouth daily.     cimetidine (TAGAMET) 200 MG tablet Take 200 mg by mouth daily.      escitalopram (LEXAPRO) 20 MG tablet TAKE 1 TABLET BY MOUTH EVERY EVENING 90 tablet 1   furosemide (LASIX) 20 MG tablet Take 1 tablet by mouth once daily may take extra 1 tablet as needed for swelling or shortness of breath.     gabapentin (NEURONTIN) 100 MG capsule Take 2 capsules (200 mg total) by mouth 3 (three) times daily. 180 capsule 0   hydrALAZINE (APRESOLINE) 100 MG tablet TAKE 1 TABLET BY MOUTH 3 TIMES DAILY 270 tablet 3   losartan (COZAAR) 100 MG tablet TAKE 1 TABLET BY MOUTH EVERY DAY 90 tablet 3   Multiple Vitamin (MULTIVITAMIN) tablet Take 1 tablet by mouth daily.     Probiotic Product (PROBIOTIC DAILY PO) Take 1 capsule by mouth daily.      rosuvastatin (CRESTOR) 10 MG tablet TAKE 1/2 TABLET BY MOUTH TWICE PER WEEK FOR CHOLESTEROL 30 tablet 3   Zoster Vaccine Adjuvanted [facility]) injection Inject 0.5mg  IM now and again in 2-6 months. 0.5 mL 0   No facility-administered medications prior to visit.    Allergies  Allergen Reactions   Penicillins Anaphylaxis and Swelling    Local swelling, throat swelling    Sulfa Antibiotics Anaphylaxis and Hives    Hives   Beta Adrenergic Blockers Other (See Comments)    bradycardia    ROS    See HPI Objective:    Physical Exam Constitutional:      General: She is not in acute distress.    Appearance: Normal appearance. She is well-developed.  HENT:     Head: Normocephalic and atraumatic.     Right Ear: External ear normal.      Left Ear: External ear normal.  Eyes:     General: No scleral icterus. Neck:     Thyroid: No thyromegaly.  Cardiovascular:     Rate and Rhythm: Normal rate and regular rhythm.     Heart sounds: Murmur heard.     Systolic murmur is present with a grade of 1/6.     Comments: Trace bilateral LE edema Pulmonary:     Effort: Pulmonary effort is normal. No respiratory distress.     Breath sounds: Normal breath sounds. No wheezing.  Abdominal:     Comments: Fat containing umbilical hernia noted, easily reducible, non-tender  Musculoskeletal:     Cervical back: Neck supple.  Skin:    General: Skin is warm and dry.  Neurological:     Mental Status: She is alert and oriented to person, place, and time.  Psychiatric:        Mood and Affect: Mood normal.        Behavior: Behavior normal.        Thought Content: Thought content normal.        Judgment: Judgment normal.      BP (!) 139/44 (BP Location: Left Arm, Patient Position: Sitting)   Pulse 75   Temp 98.6 F (37 C) (  Oral)   Resp 16   Ht 5' (1.524 m)   Wt 124 lb (56.2 kg)   SpO2 99%   BMI 24.22 kg/m  Wt Readings from Last 3 Encounters:  05/03/23 124 lb (56.2 kg)  02/13/23 125 lb (56.7 kg)  09/11/22 128 lb (58.1 kg)       Assessment & Plan:   Problem List Items Addressed This Visit       Unprioritized   Umbilical hernia without obstruction and without gangrene - Primary    Chronic umbilical hernia with recent soreness. Easily Reducible, no bowel obstruction. Discussed signs/symptoms or strangulated hernia. Surgical consultation advised to discuss possible elective repair.   - Refer to general surgery for evaluation. - Instruct to seek emergency care if nausea, vomiting, abdominal pain occur.       Relevant Orders   Ambulatory referral to General Surgery   Hyperglycemia   Relevant Orders   HgB A1c    I am having Kathrynn B. Reifsteck maintain her acetaminophen, multivitamin, Probiotic Product (PROBIOTIC DAILY  PO), cimetidine, Calcium Carb-Cholecalciferol (CALCIUM 600+D3 PO), Shingrix, rosuvastatin, escitalopram, Eliquis, losartan, hydrALAZINE, furosemide, and gabapentin.  No orders of the defined types were placed in this encounter.

## 2023-05-04 LAB — HEMOGLOBIN A1C
Hgb A1c MFr Bld: 5.2 %{Hb} (ref <5.7)
Mean Plasma Glucose: 103 mg/dL
eAG (mmol/L): 5.7 mmol/L

## 2023-05-06 ENCOUNTER — Encounter: Payer: Self-pay | Admitting: Family

## 2023-05-22 ENCOUNTER — Other Ambulatory Visit: Payer: Self-pay | Admitting: Family

## 2023-05-22 ENCOUNTER — Other Ambulatory Visit: Payer: Self-pay | Admitting: Cardiology

## 2023-07-10 ENCOUNTER — Other Ambulatory Visit: Payer: Self-pay | Admitting: Pharmacist

## 2023-07-10 DIAGNOSIS — I4891 Unspecified atrial fibrillation: Secondary | ICD-10-CM

## 2023-07-10 MED ORDER — APIXABAN 2.5 MG PO TABS: 2.5000 mg | ORAL_TABLET | Freq: Two times a day (BID) | ORAL | 1 refills | Status: DC

## 2023-07-19 DIAGNOSIS — W19XXXA Unspecified fall, initial encounter: Secondary | ICD-10-CM | POA: Diagnosis not present

## 2023-07-19 DIAGNOSIS — Z5329 Procedure and treatment not carried out because of patient's decision for other reasons: Secondary | ICD-10-CM | POA: Diagnosis not present

## 2023-07-19 DIAGNOSIS — I10 Essential (primary) hypertension: Secondary | ICD-10-CM | POA: Diagnosis not present

## 2023-07-19 DIAGNOSIS — R531 Weakness: Secondary | ICD-10-CM | POA: Diagnosis not present

## 2023-08-07 ENCOUNTER — Other Ambulatory Visit: Payer: Self-pay | Admitting: Cardiology

## 2023-08-07 DIAGNOSIS — R0602 Shortness of breath: Secondary | ICD-10-CM

## 2023-08-28 ENCOUNTER — Other Ambulatory Visit: Payer: Self-pay | Admitting: Family

## 2023-10-09 ENCOUNTER — Other Ambulatory Visit: Payer: Self-pay | Admitting: Family

## 2023-10-09 ENCOUNTER — Other Ambulatory Visit: Payer: Self-pay | Admitting: Cardiology

## 2023-10-09 DIAGNOSIS — I4891 Unspecified atrial fibrillation: Secondary | ICD-10-CM

## 2023-10-09 NOTE — Telephone Encounter (Signed)
Please contact pt to schedule follow up visit.

## 2023-10-09 NOTE — Telephone Encounter (Signed)
 Prescription refill request for Eliquis  received. Indication:afib Last office visit:1/25 Scr:1.29  6/25 Age: 89 Weight:56.2  kg Prescription refilled

## 2023-10-09 NOTE — Telephone Encounter (Signed)
Called pt left voicemail to schedule appt.

## 2023-10-30 DIAGNOSIS — S6990XA Unspecified injury of unspecified wrist, hand and finger(s), initial encounter: Secondary | ICD-10-CM | POA: Diagnosis not present

## 2023-10-30 DIAGNOSIS — Z87891 Personal history of nicotine dependence: Secondary | ICD-10-CM | POA: Diagnosis not present

## 2023-10-30 DIAGNOSIS — M4312 Spondylolisthesis, cervical region: Secondary | ICD-10-CM | POA: Diagnosis not present

## 2023-10-30 DIAGNOSIS — S61412A Laceration without foreign body of left hand, initial encounter: Secondary | ICD-10-CM | POA: Diagnosis not present

## 2023-10-30 DIAGNOSIS — S0003XA Contusion of scalp, initial encounter: Secondary | ICD-10-CM | POA: Diagnosis not present

## 2023-10-30 DIAGNOSIS — I69351 Hemiplegia and hemiparesis following cerebral infarction affecting right dominant side: Secondary | ICD-10-CM | POA: Diagnosis not present

## 2023-10-30 DIAGNOSIS — W19XXXA Unspecified fall, initial encounter: Secondary | ICD-10-CM | POA: Diagnosis not present

## 2023-10-30 DIAGNOSIS — Z7901 Long term (current) use of anticoagulants: Secondary | ICD-10-CM | POA: Diagnosis not present

## 2023-11-20 ENCOUNTER — Other Ambulatory Visit: Payer: Self-pay | Admitting: Cardiology

## 2023-11-22 NOTE — Telephone Encounter (Signed)
 For review-Are we managing/ok for refill?

## 2023-12-04 ENCOUNTER — Other Ambulatory Visit: Payer: Self-pay | Admitting: Family

## 2024-01-07 ENCOUNTER — Other Ambulatory Visit: Payer: Self-pay

## 2024-01-07 ENCOUNTER — Inpatient Hospital Stay (HOSPITAL_BASED_OUTPATIENT_CLINIC_OR_DEPARTMENT_OTHER)
Admission: EM | Admit: 2024-01-07 | Discharge: 2024-01-11 | DRG: 291 | Disposition: A | Attending: Internal Medicine | Admitting: Internal Medicine

## 2024-01-07 ENCOUNTER — Encounter (HOSPITAL_BASED_OUTPATIENT_CLINIC_OR_DEPARTMENT_OTHER): Payer: Self-pay

## 2024-01-07 ENCOUNTER — Emergency Department (HOSPITAL_BASED_OUTPATIENT_CLINIC_OR_DEPARTMENT_OTHER)

## 2024-01-07 DIAGNOSIS — E782 Mixed hyperlipidemia: Secondary | ICD-10-CM | POA: Diagnosis not present

## 2024-01-07 DIAGNOSIS — R0602 Shortness of breath: Secondary | ICD-10-CM | POA: Diagnosis present

## 2024-01-07 DIAGNOSIS — I517 Cardiomegaly: Secondary | ICD-10-CM | POA: Diagnosis not present

## 2024-01-07 DIAGNOSIS — D649 Anemia, unspecified: Secondary | ICD-10-CM | POA: Diagnosis present

## 2024-01-07 DIAGNOSIS — E785 Hyperlipidemia, unspecified: Secondary | ICD-10-CM | POA: Diagnosis present

## 2024-01-07 DIAGNOSIS — F32A Depression, unspecified: Secondary | ICD-10-CM | POA: Diagnosis present

## 2024-01-07 DIAGNOSIS — M7989 Other specified soft tissue disorders: Secondary | ICD-10-CM

## 2024-01-07 DIAGNOSIS — R531 Weakness: Secondary | ICD-10-CM

## 2024-01-07 DIAGNOSIS — I700 Atherosclerosis of aorta: Secondary | ICD-10-CM | POA: Diagnosis not present

## 2024-01-07 DIAGNOSIS — I5033 Acute on chronic diastolic (congestive) heart failure: Secondary | ICD-10-CM | POA: Diagnosis not present

## 2024-01-07 DIAGNOSIS — R7989 Other specified abnormal findings of blood chemistry: Secondary | ICD-10-CM | POA: Diagnosis not present

## 2024-01-07 DIAGNOSIS — I10 Essential (primary) hypertension: Secondary | ICD-10-CM | POA: Diagnosis present

## 2024-01-07 DIAGNOSIS — I480 Paroxysmal atrial fibrillation: Secondary | ICD-10-CM | POA: Diagnosis present

## 2024-01-07 DIAGNOSIS — I5031 Acute diastolic (congestive) heart failure: Secondary | ICD-10-CM | POA: Diagnosis not present

## 2024-01-07 DIAGNOSIS — N179 Acute kidney failure, unspecified: Secondary | ICD-10-CM

## 2024-01-07 DIAGNOSIS — Z9181 History of falling: Secondary | ICD-10-CM

## 2024-01-07 DIAGNOSIS — N1832 Chronic kidney disease, stage 3b: Secondary | ICD-10-CM | POA: Diagnosis not present

## 2024-01-07 DIAGNOSIS — I272 Pulmonary hypertension, unspecified: Secondary | ICD-10-CM | POA: Diagnosis present

## 2024-01-07 DIAGNOSIS — Z7901 Long term (current) use of anticoagulants: Secondary | ICD-10-CM

## 2024-01-07 DIAGNOSIS — M40204 Unspecified kyphosis, thoracic region: Secondary | ICD-10-CM | POA: Diagnosis not present

## 2024-01-07 DIAGNOSIS — F3341 Major depressive disorder, recurrent, in partial remission: Secondary | ICD-10-CM | POA: Diagnosis present

## 2024-01-07 HISTORY — DX: Chronic diastolic (congestive) heart failure: I50.32

## 2024-01-07 LAB — CBC WITH DIFFERENTIAL/PLATELET
Abs Immature Granulocytes: 0.01 K/uL (ref 0.00–0.07)
Basophils Absolute: 0 K/uL (ref 0.0–0.1)
Basophils Relative: 1 %
Eosinophils Absolute: 0 K/uL (ref 0.0–0.5)
Eosinophils Relative: 1 %
HCT: 29.9 % — ABNORMAL LOW (ref 36.0–46.0)
Hemoglobin: 9.8 g/dL — ABNORMAL LOW (ref 12.0–15.0)
Immature Granulocytes: 0 %
Lymphocytes Relative: 24 %
Lymphs Abs: 0.9 K/uL (ref 0.7–4.0)
MCH: 31.7 pg (ref 26.0–34.0)
MCHC: 32.8 g/dL (ref 30.0–36.0)
MCV: 96.8 fL (ref 80.0–100.0)
Monocytes Absolute: 0.5 K/uL (ref 0.1–1.0)
Monocytes Relative: 13 %
Neutro Abs: 2.3 K/uL (ref 1.7–7.7)
Neutrophils Relative %: 61 %
Platelets: 226 K/uL (ref 150–400)
RBC: 3.09 MIL/uL — ABNORMAL LOW (ref 3.87–5.11)
RDW: 13.6 % (ref 11.5–15.5)
WBC: 3.9 K/uL — ABNORMAL LOW (ref 4.0–10.5)
nRBC: 0 % (ref 0.0–0.2)

## 2024-01-07 LAB — URINALYSIS, W/ REFLEX TO CULTURE (INFECTION SUSPECTED)
Bilirubin Urine: NEGATIVE
Glucose, UA: NEGATIVE mg/dL
Hgb urine dipstick: NEGATIVE
Ketones, ur: NEGATIVE mg/dL
Leukocytes,Ua: NEGATIVE
Nitrite: NEGATIVE
Protein, ur: NEGATIVE mg/dL
Specific Gravity, Urine: 1.015 (ref 1.005–1.030)
pH: 7 (ref 5.0–8.0)

## 2024-01-07 LAB — COMPREHENSIVE METABOLIC PANEL WITH GFR
ALT: 15 U/L (ref 0–44)
AST: 34 U/L (ref 15–41)
Albumin: 4.5 g/dL (ref 3.5–5.0)
Alkaline Phosphatase: 53 U/L (ref 38–126)
Anion gap: 14 (ref 5–15)
BUN: 23 mg/dL (ref 8–23)
CO2: 22 mmol/L (ref 22–32)
Calcium: 9 mg/dL (ref 8.9–10.3)
Chloride: 99 mmol/L (ref 98–111)
Creatinine, Ser: 1.55 mg/dL — ABNORMAL HIGH (ref 0.44–1.00)
GFR, Estimated: 32 mL/min — ABNORMAL LOW (ref >60)
Glucose, Bld: 115 mg/dL — ABNORMAL HIGH (ref 70–99)
Potassium: 4.4 mmol/L (ref 3.5–5.1)
Sodium: 135 mmol/L (ref 135–145)
Total Bilirubin: 0.3 mg/dL (ref 0.0–1.2)
Total Protein: 7.3 g/dL (ref 6.5–8.1)

## 2024-01-07 LAB — LACTIC ACID, PLASMA
Lactic Acid, Venous: 1.4 mmol/L (ref 0.5–1.9)
Lactic Acid, Venous: 1 mmol/L (ref 0.5–1.9)

## 2024-01-07 LAB — RESP PANEL BY RT-PCR (RSV, FLU A&B, COVID)  RVPGX2
Influenza A by PCR: NEGATIVE
Influenza B by PCR: NEGATIVE
Resp Syncytial Virus by PCR: NEGATIVE
SARS Coronavirus 2 by RT PCR: NEGATIVE

## 2024-01-07 LAB — TROPONIN T, HIGH SENSITIVITY
Troponin T High Sensitivity: 44 ng/L — ABNORMAL HIGH (ref 0–19)
Troponin T High Sensitivity: 42 ng/L — ABNORMAL HIGH (ref 0–19)

## 2024-01-07 LAB — PRO BRAIN NATRIURETIC PEPTIDE: Pro Brain Natriuretic Peptide: 2335 pg/mL — ABNORMAL HIGH (ref <300.0)

## 2024-01-07 LAB — LIPASE, BLOOD: Lipase: 67 U/L — ABNORMAL HIGH (ref 11–51)

## 2024-01-07 MED ORDER — ISOSORBIDE MONONITRATE ER 30 MG PO TB24: 30.0000 mg | ORAL_TABLET | Freq: Every day | ORAL | Status: DC

## 2024-01-07 MED ORDER — GABAPENTIN 100 MG PO CAPS
200.0000 mg | ORAL_CAPSULE | Freq: Three times a day (TID) | ORAL | Status: DC
  Administered 2024-01-07 – 2024-01-09 (×6): 200 mg via ORAL
  Filled 2024-01-07 (×11): qty 2

## 2024-01-07 MED ORDER — LOSARTAN POTASSIUM 50 MG PO TABS
100.0000 mg | ORAL_TABLET | Freq: Every day | ORAL | Status: DC
  Administered 2024-01-08 – 2024-01-09 (×2): 100 mg via ORAL
  Filled 2024-01-07 (×2): qty 4
  Filled 2024-01-07: qty 2

## 2024-01-07 MED ORDER — ONDANSETRON HCL 4 MG/2ML IJ SOLN: 4.0000 mg | Freq: Four times a day (QID) | INTRAMUSCULAR | Status: DC | PRN

## 2024-01-07 MED ORDER — ROSUVASTATIN CALCIUM 10 MG PO TABS: 10.0000 mg | ORAL_TABLET | Freq: Every day | ORAL | Status: DC

## 2024-01-07 MED ORDER — ESCITALOPRAM OXALATE 20 MG PO TABS
20.0000 mg | ORAL_TABLET | Freq: Every evening | ORAL | Status: DC
  Administered 2024-01-08 – 2024-01-10 (×3): 20 mg via ORAL
  Filled 2024-01-07 (×3): qty 1
  Filled 2024-01-07: qty 2

## 2024-01-07 MED ORDER — ACETAMINOPHEN 650 MG RE SUPP: 650.0000 mg | Freq: Four times a day (QID) | RECTAL | Status: DC | PRN

## 2024-01-07 MED ORDER — APIXABAN 2.5 MG PO TABS
2.5000 mg | ORAL_TABLET | Freq: Two times a day (BID) | ORAL | Status: DC
  Administered 2024-01-07 – 2024-01-11 (×8): 2.5 mg via ORAL
  Filled 2024-01-07 (×10): qty 1

## 2024-01-07 MED ORDER — HYDRALAZINE HCL 50 MG PO TABS
100.0000 mg | ORAL_TABLET | Freq: Three times a day (TID) | ORAL | Status: DC
  Administered 2024-01-07 – 2024-01-11 (×8): 100 mg via ORAL
  Filled 2024-01-07 (×2): qty 2
  Filled 2024-01-07 (×2): qty 4
  Filled 2024-01-07: qty 2
  Filled 2024-01-07 (×9): qty 4

## 2024-01-07 MED ORDER — ACETAMINOPHEN 325 MG PO TABS
650.0000 mg | ORAL_TABLET | Freq: Four times a day (QID) | ORAL | Status: DC | PRN
  Administered 2024-01-07 – 2024-01-11 (×8): 650 mg via ORAL
  Filled 2024-01-07 (×14): qty 2

## 2024-01-07 MED ORDER — FUROSEMIDE 10 MG/ML IJ SOLN
40.0000 mg | Freq: Once | INTRAMUSCULAR | Status: AC
  Administered 2024-01-07: 40 mg via INTRAVENOUS
  Filled 2024-01-07: qty 4

## 2024-01-07 MED ORDER — LACTATED RINGERS IV BOLUS
1000.0000 mL | Freq: Once | INTRAVENOUS | Status: AC
  Administered 2024-01-07: 1000 mL via INTRAVENOUS

## 2024-01-07 MED ORDER — MELATONIN 3 MG PO TABS
3.0000 mg | ORAL_TABLET | Freq: Every evening | ORAL | Status: DC | PRN
  Filled 2024-01-07: qty 1

## 2024-01-07 MED ORDER — FUROSEMIDE 10 MG/ML IJ SOLN
40.0000 mg | Freq: Two times a day (BID) | INTRAMUSCULAR | Status: DC
  Administered 2024-01-08 – 2024-01-09 (×3): 40 mg via INTRAVENOUS
  Filled 2024-01-07 (×5): qty 4

## 2024-01-07 NOTE — Plan of Care (Addendum)
 Lorraine Page, is a 89 y.o. female, DOB - 08-Sep-1932, FMW:[id number]  With H/O Chr d CHF, Afib on Eliquis , HTN, dyslipidemia, CKD 3 A, came to [facility] for weakness, orthopnea and SOB, diagnosed with CHF, admit to tele for Lasix , BP control, PT, repeat TTE.   Vitals:   01/07/24 1458 01/07/24 1510 01/07/24 1557 01/07/24 1710  BP: (!) 154/62 (!) 164/60  (!) 176/71  Pulse: 82 80  84  Resp: 17 20  17   Temp: 98.2 F (36.8 C) 98.2 F (36.8 C)  98.2 F (36.8 C)  TempSrc: Oral Oral  Oral  SpO2: 99% 100%  100%  Weight:   57.1 kg   Height:   5' (1.524 m)         Data Review   Micro Results Recent Results (from the past 240 hours)  Resp panel by RT-PCR (RSV, Flu A&B, Covid) Anterior Nasal Swab     Status: None   Collection Time: 01/07/24  3:16 PM   Specimen: Anterior Nasal Swab  Result Value Ref Range Status   SARS Coronavirus 2 by RT PCR NEGATIVE NEGATIVE Final    Comment: (NOTE) SARS-CoV-2 target nucleic acids are NOT DETECTED.  The SARS-CoV-2 RNA is generally detectable in upper respiratory specimens during the acute phase of infection. The lowest concentration of SARS-CoV-2 viral copies this assay can detect is 138 copies/mL. A negative result does not preclude SARS-Cov-2 infection and should not be used as the sole basis for treatment or other patient management decisions. A negative result may occur with  improper specimen collection/handling, submission of specimen other than nasopharyngeal swab, presence of viral mutation(s) within the areas targeted by this assay, and inadequate number of viral copies(<138 copies/mL). A negative result must be combined with clinical observations, patient history, and epidemiological information. The expected result is Negative.  Fact Sheet for Patients:  bloggercourse.com  Fact Sheet for Healthcare Providers:   seriousbroker.it  This test is no t yet approved or cleared by the [place]  FDA and  has been authorized for detection and/or diagnosis of SARS-CoV-2 by FDA under an Emergency Use Authorization (EUA). This EUA will remain  in effect (meaning this test can be used) for the duration of the COVID-19 declaration under Section 564(b)(1) of the Act, 21 U.S.C.section 360bbb-3(b)(1), unless the authorization is terminated  or revoked sooner.       Influenza A by PCR NEGATIVE NEGATIVE Final   Influenza B by PCR NEGATIVE NEGATIVE Final    Comment: (NOTE) The Xpert Xpress SARS-CoV-2/FLU/RSV plus assay is intended as an aid in the diagnosis of influenza from Nasopharyngeal swab specimens and should not be used as a sole basis for treatment. Nasal washings and aspirates are unacceptable for Xpert Xpress SARS-CoV-2/FLU/RSV testing.  Fact Sheet for Patients: bloggercourse.com  Fact Sheet for Healthcare Providers: seriousbroker.it  This test is not yet approved or cleared by the [place]  FDA and has been authorized for detection and/or diagnosis of SARS-CoV-2 by FDA under an Emergency Use Authorization (EUA). This EUA will remain in effect (meaning this test can be used) for the duration of the COVID-19 declaration under Section 564(b)(1) of the Act, 21 U.S.C. section 360bbb-3(b)(1), unless the authorization is terminated or revoked.     Resp Syncytial Virus by PCR NEGATIVE NEGATIVE Final    Comment: (NOTE) Fact Sheet for Patients: bloggercourse.com  Fact Sheet for Healthcare Providers: seriousbroker.it  This test is not yet approved or cleared by the [place]  FDA and has been authorized for  detection and/or diagnosis of SARS-CoV-2 by FDA under an Emergency Use Authorization (EUA). This EUA will remain in effect (meaning this test can be used) for  the duration of the COVID-19 declaration under Section 564(b)(1) of the Act, 21 U.S.C. section 360bbb-3(b)(1), unless the authorization is terminated or revoked.  Performed at [facility], [address], [place], [place] [address]     Radiology Reports DG Chest 2 View Result Date: 01/07/2024 EXAM: 2 VIEW(S) XRAY OF THE CHEST 01/07/2024 03:55:31 PM COMPARISON: Chest x ray 03/25/2023. CLINICAL HISTORY: weakness FINDINGS: LUNGS AND PLEURA: Lungs are hypoinflated with possible mild vascular congestion. No lobar consolidation or effusion. No pneumothorax. HEART AND MEDIASTINUM: Mild cardiomegaly. Aortic atherosclerosis. BONES AND SOFT TISSUES: Exaggerated thoracic kyphosis. Advanced degenerative changes of the right shoulder. No acute osseous abnormality. IMPRESSION: 1. Hypoinflated  lungs and possible mild vascular congestion. 2. Mild cardiomegaly with aortic atherosclerosis. Electronically signed by: Toribio Agreste MD 01/07/2024 04:03 PM EST RP Workstation: [id number]    CBC Recent Labs  Lab 01/07/24 1516  WBC 3.9*  HGB 9.8*  HCT 29.9*  PLT 226  MCV 96.8  MCH 31.7  MCHC 32.8  RDW 13.6  LYMPHSABS 0.9  MONOABS 0.5  EOSABS 0.0  BASOSABS 0.0    Chemistries  Recent Labs  Lab 01/07/24 1516  NA 135  K 4.4  CL 99  CO2 22  GLUCOSE 115*  BUN 23  CREATININE 1.55*  CALCIUM  9.0  AST 34  ALT 15  ALKPHOS 53  BILITOT 0.3   ------------------------------------------------------------------------------------------------------------------ estimated creatinine clearance is 19.5 mL/min (A) (by C-G formula based on SCr of 1.55 mg/dL (H)). ------------------------------------------------------------------------------------------------------------------ No results for input(s): HGBA1C in the last 72 hours. ------------------------------------------------------------------------------------------------------------------ No results for input(s): CHOL, HDL, LDLCALC,  TRIG, CHOLHDL, LDLDIRECT in the last 72 hours. ------------------------------------------------------------------------------------------------------------------ No results for input(s): TSH, T4TOTAL, T3FREE, THYROIDAB in the last 72 hours.  Invalid input(s): FREET3 ------------------------------------------------------------------------------------------------------------------ No results for input(s): VITAMINB12, FOLATE, FERRITIN, TIBC, IRON, RETICCTPCT in the last 72 hours.  Coagulation profile No results for input(s): INR, PROTIME in the last 168 hours.  No results for input(s): DDIMER in the last 72 hours.  Cardiac Enzymes No results for input(s): CKMB, TROPONINI, MYOGLOBIN in the last 168 hours.  Invalid input(s): CK ------------------------------------------------------------------------------------------------------------------ Invalid input(s): POCBNP   Signature  Lavada Stank M.D on 01/07/2024 at 5:35 PM   -  To page go to [email or web address]

## 2024-01-07 NOTE — ED Notes (Signed)
 Per Dr. Neysa, IVF bolus is to be discontinued after 500mL.

## 2024-01-07 NOTE — ED Notes (Signed)
 Please call daughter when pt is transported to hospital Donny Ferrier [phone]

## 2024-01-07 NOTE — ED Notes (Signed)
 Pt aware of necessity of urine sample.

## 2024-01-07 NOTE — ED Provider Notes (Addendum)
 [facility] Provider Note   CSN: [id number] Arrival date & time: 01/07/24  1453     Patient presents with: Weakness   Lorraine Page is a 89 y.o. female.   89 year old female presenting emergency department for generalized weakness/malaise.  Reports worsening pain in her bilateral lower extremities with edema for the past few days.  Today she tires more quickly.  Denies chest pain when asked if short of breath she denies at rest but states you could call what that when she is ambulating.  No fevers, chills, URI symptoms.  No abdominal pain nausea or vomiting.  No dysuria.  Family member did have fever at Thanksgiving, but patient had no direct contact with them.  Does note history of CHF has been taking diuretics   Weakness      Prior to Admission medications   Medication Sig Start Date End Date Taking? Authorizing Provider  acetaminophen  (TYLENOL ) 650 MG CR tablet Take 1,300 mg by mouth 3 (three) times daily.    [provider]  Calcium  Carb-Cholecalciferol (CALCIUM  600+D3 PO) Take 1 tablet by mouth daily.    [provider]  cimetidine (TAGAMET) 200 MG tablet Take 200 mg by mouth daily.     [provider]  ELIQUIS  2.5 MG TABS tablet TAKE 1 TABLET BY MOUTH 2 TIMES A DAY 10/09/23   Pietro Redell RAMAN, MD  escitalopram  (LEXAPRO ) 20 MG tablet TAKE 1 TABLET BY MOUTH EVERY EVENING 05/22/23   Pietro Redell RAMAN, MD  furosemide  (LASIX ) 20 MG tablet TAKE 2 TABLETS (40 MG) BY MOUTH DAILY 08/07/23   Pietro Redell RAMAN, MD  gabapentin  (NEURONTIN ) 100 MG capsule TAKE 2 CAPSULES BY MOUTH 3 TIMES A DAY 12/04/23   O'Sullivan, Melissa, NP  hydrALAZINE  (APRESOLINE ) 100 MG tablet TAKE 1 TABLET BY MOUTH 3 TIMES DAILY 04/04/23   Pietro Redell RAMAN, MD  losartan  (COZAAR ) 100 MG tablet TAKE 1 TABLET BY MOUTH EVERY DAY 01/09/23   Pietro Redell RAMAN, MD  Multiple Vitamin (MULTIVITAMIN) tablet Take 1 tablet by mouth daily.    [provider]  Probiotic Product (PROBIOTIC DAILY PO) Take 1 capsule by mouth daily.     [provider]  rosuvastatin  (CRESTOR ) 10 MG tablet TAKE 1/2 TABLET BY MOUTH TWICE PER WEEK FOR CHOLESTEROL 07/20/22   Pietro Redell RAMAN, MD  Zoster Vaccine Adjuvanted (SHINGRIX ) injection Inject 0.5mg  IM now and again in 2-6 months. 03/12/22   O'Sullivan, Melissa, NP    Allergies: Penicillins, Sulfa antibiotics, and Beta adrenergic blockers    Review of Systems  Neurological:  Positive for weakness.    Updated Vital Signs BP (!) 176/71 (BP Location: Right Arm)   Pulse 84   Temp 98.2 F (36.8 C) (Oral)   Resp 17   Ht 5' (1.524 m)   Wt 57.1 kg   SpO2 100%   BMI 24.58 kg/m   Physical Exam Vitals and nursing note reviewed.  Constitutional:      General: She is not in acute distress.    Appearance: She is not toxic-appearing.  HENT:     Head: Normocephalic.     Nose: Nose normal.     Mouth/Throat:     Mouth: Mucous membranes are moist.  Eyes:     Conjunctiva/sclera: Conjunctivae normal.  Cardiovascular:     Rate and Rhythm: Normal rate and regular rhythm.  Pulmonary:     Effort: Pulmonary effort is normal.     Comments: Coarse breath sounds  Abdominal:     General: Abdomen is flat. There is no distension.     Tenderness: There is no abdominal tenderness. There is no guarding or rebound.  Musculoskeletal:     Right lower leg: Edema present.     Left lower leg: Edema present.  Skin:    General: Skin is warm and dry.     Capillary Refill: Capillary refill takes less than 2 seconds.  Neurological:     Mental Status: She is alert.  Psychiatric:        Mood and Affect: Mood normal.        Behavior: Behavior normal.     (all labs ordered are listed, but only abnormal results are displayed) Labs Reviewed  CBC WITH DIFFERENTIAL/PLATELET - Abnormal; Notable for the following components:      Result Value   WBC 3.9 (*)    RBC 3.09 (*)    Hemoglobin 9.8 (*)    HCT 29.9 (*)     All other components within normal limits  COMPREHENSIVE METABOLIC PANEL WITH GFR - Abnormal; Notable for the following components:   Glucose, Bld 115 (*)    Creatinine, Ser 1.55 (*)    GFR, Estimated 32 (*)    All other components within normal limits  LIPASE, BLOOD - Abnormal; Notable for the following components:   Lipase 67 (*)    All other components within normal limits  PRO BRAIN NATRIURETIC PEPTIDE - Abnormal; Notable for the following components:   Pro Brain Natriuretic Peptide 2,335.0 (*)    All other components within normal limits  TROPONIN T, HIGH SENSITIVITY - Abnormal; Notable for the following components:   Troponin T High Sensitivity 44 (*)    All other components within normal limits  RESP PANEL BY RT-PCR (RSV, FLU A&B, COVID)  [id number]  LACTIC ACID, PLASMA  URINALYSIS, W/ REFLEX TO CULTURE (INFECTION SUSPECTED)  LACTIC ACID, PLASMA  TROPONIN T, HIGH SENSITIVITY    EKG: EKG Interpretation Date/Time:  Tuesday January 07 2024 15:05:45 EST Ventricular Rate:  81 PR Interval:    QRS Duration:  85 QT Interval:  379 QTC Calculation: 440 R Axis:   34  Text Interpretation: Normal sinus rhythm Probable lateral infarct, old Confirmed by Neysa Clap [phone]) on 01/07/2024 3:29:23 PM  Radiology: DG Chest 2 View Result Date: 01/07/2024 EXAM: 2 VIEW(S) XRAY OF THE CHEST 01/07/2024 03:55:31 PM COMPARISON: Chest x ray 03/25/2023. CLINICAL HISTORY: weakness FINDINGS: LUNGS AND PLEURA: Lungs are hypoinflated with possible mild vascular congestion. No lobar consolidation or effusion. No pneumothorax. HEART AND MEDIASTINUM: Mild cardiomegaly. Aortic atherosclerosis. BONES AND SOFT TISSUES: Exaggerated thoracic kyphosis. Advanced degenerative changes of the right shoulder. No acute osseous abnormality. IMPRESSION: 1. Hypoinflated  lungs and possible mild vascular congestion. 2. Mild cardiomegaly with aortic atherosclerosis. Electronically signed by: Toribio Agreste MD 01/07/2024 04:03  PM EST RP Workstation: [id number]     Procedures   Medications Ordered in the ED  hydrALAZINE  (APRESOLINE ) tablet 100 mg (has no administration in time range)  apixaban  (ELIQUIS ) tablet 2.5 mg (has no administration in time range)  rosuvastatin  (CRESTOR ) tablet 10 mg (has no administration in time range)  gabapentin  (NEURONTIN ) capsule 200 mg (has no administration in time range)  isosorbide  mononitrate (IMDUR ) 24 hr tablet 30 mg (has no administration in time range)  lactated ringers  bolus 1,000 mL (0 mLs Intravenous Stopped 01/07/24 1615)  furosemide  (LASIX ) injection 40 mg (40 mg Intravenous Given 01/07/24 1708)    Clinical Course as of 01/07/24 1734  Tue Jan 07, 2024  1556 CBC with Differential(!) Anemia noted.  Was 11 2 years ago, so slightly worsened. [TY]  1601 Comprehensive metabolic panel(!) Stable CKD compared to prior labs [TY]  1602 Echo in 2020: 1. Left ventricular ejection fraction, by visual estimation, is 55 to  60%. The left ventricle has normal function. There is mildly increased  left ventricular hypertrophy.   2. Left ventricular diastolic parameters are consistent with Grade III  diastolic dysfunction (restrictive).   [TY]  1633 DG Chest 2 View MPRESSION: 1. Hypoinflated  lungs and possible mild vascular congestion. 2. Mild cardiomegaly with aortic atherosclerosis.   [TY]  1633 Pro Brain Natriuretic Peptide(!): 2,335.0 Concern for CHF exacerbation.  IV fluids stopped.  Received 500 mL.  Lasix  ordered. [TY]  1633 Troponin T High Sensitivity(!): 44 [TY]  1703 Resp panel by RT-PCR (RSV, Flu A&B, Covid) Anterior Nasal Swab Negative for viral etiology [TY]  1703 Lactic Acid, Venous: 1.4 Systemic infection less likely [TY]  1704 Lipase(!): 67 Not having abdominal pain.  No liver enzyme elevation.  Low suspicion for acute pancreatitis [TY]  1704 Given patient's advanced age and significant comorbidities with high propensity for decompensation will admit for  observation and diuresis.  Possible echo if she has not had one in 5 years.  Discussed with patient and family who are agreeable for admission. [TY]  1732 Spoke with Dr. Dennise hospitalist who agrees to admit patient. [TY]    Clinical Course User Index [TY] Neysa Caron PARAS, DO                                 Medical Decision Making This is an 89 year old female with complicated past medical history to include prior stroke, A-fib on Eliquis , hypertension hyperlipidemia, CKD, obesity presenting emergency department with generalized weakness.  She is afebrile nontachycardic, slightly hypertensive.  Physical exam without localizing neurodeficits.  Lungs are clear.  Equal pulses.  Does have some minor edema in lower extremities.  Query viral etiology given patient's granddaughter at Thanksgiving with fever.  Daughter at bedside notes that patient mentating at baseline.  Is complaining of some bilateral leg pain which is not a new complaint, but may be worsened over the past few days.  She has some lower extremity edema, but no asymmetry to suggest DVT.  Per chart review follows with cardiology, per their note has diastolic heart failure.  Will get broad screening labs, chest x-ray, flu/COVID/RSV.  Will give fluids  Amount and/or Complexity of Data Reviewed Independent Historian:     Details: Daughter notes patient with similar presentation several months ago and was dehydrated.  They decreased her Lasix  dose at that time. External Data Reviewed:     Details: Last echo in 2020 appeared to have normal EF, but diastolic dysfunction. Labs: ordered. Decision-making details documented in ED Course.    Details: See ED course Radiology: ordered and independent interpretation performed. Decision-making details documented in ED Course.    Details: Agree with radiology; vascular congestion do not appreciate pneumonia pneumothorax ECG/medicine tests: ordered and independent interpretation performed.  Decision-making details documented in ED Course.    Details: EKG appears to be sinus rhythm at a rate of 81 bpm.  Normal intervals.  No QTc prolongation.  No ST segment changes to indicate ischemia.  Risk Prescription drug management. Decision regarding hospitalization. Diagnosis or treatment significantly limited by social determinants of health. Risk Details: Poor health literacy  Final diagnoses:  Acute on chronic diastolic congestive heart failure [facility])    ED Discharge Orders     None          Neysa Caron PARAS, DO 01/07/24 1716    Neysa Caron PARAS, DO 01/07/24 1734

## 2024-01-07 NOTE — H&P (Signed)
 History and Physical      Lorraine Page FMW:[id number] DOB: 12/16/1932 DOA: 01/07/2024; DOS: 01/07/2024  PCP: Daryl Setter, NP  Patient coming from: home   I have personally briefly reviewed patient's old medical records in [facility] Link  Chief Complaint: Shortness of breath  HPI: Lorraine Page is a 89 y.o. female with medical history significant for chronic diastolic heart failure, paroxysmal atrial fibrillation chronically anticoagulated on Eliquis , essential hypertension, hyperlipidemia, CKD 3B with baseline creatinine 1.3-1.7, anemia of chronic disease with baseline hemoglobin 11-12, who is admitted to [facility] on 01/07/2024 by way of transfer from [facility] with acute on chronic diastolic heart failure after presenting from home to the latter facility complaining of shortness of breath.  The patient reports 3 to 4 days of progressive shortness of breath associated with orthopnea, and worsening of edema in the bilateral lower extremities.  Denies any associated chest pain, palpitations, diaphoresis.  No associated any wheezing, hemoptysis, nor any recent subjective fever, chills, rigors, or generalized myalgias.  Over the last 3 to 4 days, she has also noted generalized weakness in the absence of any acute focal weakness.  Denies any recent dysuria or gross hematuria.  She has a history of chronic diastolic heart failure, with most recent echocardiogram performed in November 2020, which was notable for LVEF 55 to 60%, grade 3 diastolic dysfunction, mildly reduced right ventricular systolic function, mildly dilated bilateral atria, moderate mitral digitation as well as moderate tricuspid regurgitation.  Her patient diuretic regimen consists of Lasix  40 mg p.o. daily.  She is also noted to be on losartan  and hydralazine  at home.  Her cardiac history is also notable for paroxysmal atrial fibrillation for which she is chronically anticoagulated on Eliquis .   Not on any AV nodal blocking agents at home.    ED Course:  Vital signs in the ED were notable for the following: Afebrile; rates in the 70s to 80s; systolic blood pressures in the 130s to 160s; respiratory rate 15-20, oxygen saturation 97 to 100% on room air.  Labs were notable for the following: CMP was notable for the following: Sodium 135, potassium 4.4, bicarbonate 22, creatinine 1.55 compared to most recent prior value of 1.32 on 04/19/2023.  Liver enzymes are within normal limits.  proBNP 2335 without a prior proBNP data point available for point comparison.  High sensitive troponin I was initially 44, with repeat trending down to 42.  CBC notable for white cell count 3900, hemoglobin 9.8 associated Neuraceq/Norocarp properties and nonelevated RDW, relative demonstrates a prior hemoglobin data point of 11.4 on 07/19/2023, platelet count 226.  Lactic acid 1.4, with repeat trending down to 1.0.  Urinalysis showed no evidence of succumbs was leukocyte esterase/nitrate negative and showed no bacteria.  COVID, influenza, RSV PCR were all negative.  Per my interpretation, EKG in ED demonstrated the following: Sinus rhythm with PAC, heart rate 81, normal intervals, no evidence of T wave changes, nonspecific less than 1 mm ST depression limited to V6, otherwise demonstrate no evidence of ST changes, including no evidence of ST elevation.  Imaging in the ED, per corresponding formal radiology read, was notable for the following: 2 view chest x-ray showed cardiomegaly with evidence of increased pulmonary vascular congestion, while showing no evidence of infiltrate, pleural effusion, or pneumothorax.  While in the ED, the following were administered: Initially she received lactated Ringer 's x 1 L bolus, followed by Lasix  40 mg IV x 1 dose, gabapentin  200 mg p.o.,  and home hydralazine  100 mg p.o. x 1 dose.  Subsequently, the patient was admitted to [facility] for further evaluation management of presenting  acute on chronic diastolic heart failure.      Review of Systems: As per HPI otherwise 10 point review of systems negative.   Past Medical History:  Diagnosis Date   Atrial fibrillation (HCC)    Chronic kidney disease, stage 3 (HCC)    CVA (cerebral vascular accident) (HCC)    GERD (gastroesophageal reflux disease)    GERD (gastroesophageal reflux disease)    Hyperlipidemia    Hypertension    Osteoarthritis    Pancreas cyst 06/21/2022   MRI notes: Numerous tiny fluid signal intensity lesions in the pancreas, with the largest lesion along the cephalad portion of the pancreatic head measuring 1.9 cm in long axis. Possibilities include dilated side ducts, postinflammatory cystic lesions, or intraductal papillary mucinous neoplasm. Based on lesion size and patient age, follow pancreatic protocol MRI with and without co   Prediabetes     Past Surgical History:  Procedure Laterality Date   carpel tunnel Right    CHOLECYSTECTOMY     RHINOPLASTY     TOTAL HIP ARTHROPLASTY Right 08/12/2018   Procedure: RIGHT TOTAL HIP ARTHROPLASTY ANTERIOR APPROACH;  Surgeon: Vernetta Lonni GRADE, MD;  Location: [facility] OR;  Service: Orthopedics;  Laterality: Right;    Social History:  reports that she has quit smoking. She has never used smokeless tobacco. She reports that she does not currently use alcohol. She reports that she does not use drugs.   Allergies  Allergen Reactions   Penicillins Anaphylaxis and Swelling    Local swelling, throat swelling    Sulfa Antibiotics Anaphylaxis and Hives    Hives   Beta Adrenergic Blockers Other (See Comments)    bradycardia    Family History  Problem Relation Age of Onset   Cancer Mother    Stomach cancer Mother    CAD Father    Pneumonia Sister    Breast cancer Sister    Aortic stenosis Sister    Congestive Heart Failure Sister    Gastric cancer Brother     Family history reviewed and not pertinent    Prior to Admission medications    Medication Sig Start Date End Date Taking? Authorizing Provider  acetaminophen  (TYLENOL ) 650 MG CR tablet Take 1,300 mg by mouth 3 (three) times daily.    [provider]  Calcium  Carb-Cholecalciferol (CALCIUM  600+D3 PO) Take 1 tablet by mouth daily.    [provider]  cimetidine (TAGAMET) 200 MG tablet Take 200 mg by mouth daily.     [provider]  ELIQUIS  2.5 MG TABS tablet TAKE 1 TABLET BY MOUTH 2 TIMES A DAY 10/09/23   Pietro Redell RAMAN, MD  escitalopram  (LEXAPRO ) 20 MG tablet TAKE 1 TABLET BY MOUTH EVERY EVENING 05/22/23   Pietro Redell RAMAN, MD  furosemide  (LASIX ) 20 MG tablet TAKE 2 TABLETS (40 MG) BY MOUTH DAILY 08/07/23   Pietro Redell RAMAN, MD  gabapentin  (NEURONTIN ) 100 MG capsule TAKE 2 CAPSULES BY MOUTH 3 TIMES A DAY 12/04/23   O'Sullivan, Melissa, NP  hydrALAZINE  (APRESOLINE ) 100 MG tablet TAKE 1 TABLET BY MOUTH 3 TIMES DAILY 04/04/23   Pietro Redell RAMAN, MD  losartan  (COZAAR ) 100 MG tablet TAKE 1 TABLET BY MOUTH EVERY DAY 01/09/23   Pietro Redell RAMAN, MD  Multiple Vitamin (MULTIVITAMIN) tablet Take 1 tablet by mouth daily.    [provider]  Probiotic Product (PROBIOTIC  DAILY PO) Take 1 capsule by mouth daily.     [provider]  rosuvastatin  (CRESTOR ) 10 MG tablet TAKE 1/2 TABLET BY MOUTH TWICE PER WEEK FOR CHOLESTEROL 07/20/22   Pietro Redell RAMAN, MD  Zoster Vaccine Adjuvanted (SHINGRIX ) injection Inject 0.5mg  IM now and again in 2-6 months. 03/12/22   Daryl Setter, NP     Objective    Physical Exam: Vitals:   01/07/24 1710 01/07/24 1900 01/07/24 2100 01/07/24 2239  BP: (!) 176/71 (!) 159/70 135/62 (!) 147/62  Pulse: 84 71  72  Resp: 17 19 16 15   Temp: 98.2 F (36.8 C) 98.2 F (36.8 C)  98.3 F (36.8 C)  TempSrc: Oral Oral  Oral  SpO2: 100% 100%  97%  Weight:    52.6 kg  Height:    5' (1.524 m)    General: appears to be stated age; alert, oriented Skin: warm, dry, no rash Head:  AT/[place] Mouth:  Oral mucosa membranes  appear moist, normal dentition Neck: supple; trachea midline Heart:  RRR; 2/6 holosystolic murmur noted Lungs: CTAB, did not appreciate any wheezes, rales, or rhonchi Abdomen: + BS; soft, ND, NT Vascular: 2+ pedal pulses b/l; 2+ radial pulses b/l Extremities: Trace edema on the bilateral lower extremities     Labs on Admission: I have personally reviewed following labs and imaging studies  CBC: Recent Labs  Lab 01/07/24 1516  WBC 3.9*  NEUTROABS 2.3  HGB 9.8*  HCT 29.9*  MCV 96.8  PLT 226   Basic Metabolic Panel: Recent Labs  Lab 01/07/24 1516  NA 135  K 4.4  CL 99  CO2 22  GLUCOSE 115*  BUN 23  CREATININE 1.55*  CALCIUM  9.0   GFR: Estimated Creatinine Clearance: 17.7 mL/min (A) (by C-G formula based on SCr of 1.55 mg/dL (H)). Liver Function Tests: Recent Labs  Lab 01/07/24 1516  AST 34  ALT 15  ALKPHOS 53  BILITOT 0.3  PROT 7.3  ALBUMIN  4.5   Recent Labs  Lab 01/07/24 1516  LIPASE 67*   No results for input(s): AMMONIA in the last 168 hours. Coagulation Profile: No results for input(s): INR, PROTIME in the last 168 hours. Cardiac Enzymes: No results for input(s): CKTOTAL, CKMB, CKMBINDEX, TROPONINI in the last 168 hours. BNP (last 3 results) Recent Labs    01/07/24 1516  PROBNP 2,335.0*   HbA1C: No results for input(s): HGBA1C in the last 72 hours. CBG: No results for input(s): GLUCAP in the last 168 hours. Lipid Profile: No results for input(s): CHOL, HDL, LDLCALC, TRIG, CHOLHDL, LDLDIRECT in the last 72 hours. Thyroid  Function Tests: No results for input(s): TSH, T4TOTAL, FREET4, T3FREE, THYROIDAB in the last 72 hours. Anemia Panel: No results for input(s): VITAMINB12, FOLATE, FERRITIN, TIBC, IRON, RETICCTPCT in the last 72 hours. Urine analysis:    Component Value Date/Time   COLORURINE YELLOW 01/07/2024 1516   APPEARANCEUR CLEAR 01/07/2024 1516   LABSPEC 1.015 01/07/2024 1516    PHURINE 7.0 01/07/2024 1516   GLUCOSEU NEGATIVE 01/07/2024 1516   HGBUR NEGATIVE 01/07/2024 1516   BILIRUBINUR NEGATIVE 01/07/2024 1516   KETONESUR NEGATIVE 01/07/2024 1516   PROTEINUR NEGATIVE 01/07/2024 1516   NITRITE NEGATIVE 01/07/2024 1516   LEUKOCYTESUR NEGATIVE 01/07/2024 1516    Radiological Exams on Admission: DG Chest 2 View Result Date: 01/07/2024 EXAM: 2 VIEW(S) XRAY OF THE CHEST 01/07/2024 03:55:31 PM COMPARISON: Chest x ray 03/25/2023. CLINICAL HISTORY: weakness FINDINGS: LUNGS AND PLEURA: Lungs are hypoinflated with possible mild vascular congestion. No lobar  consolidation or effusion. No pneumothorax. HEART AND MEDIASTINUM: Mild cardiomegaly. Aortic atherosclerosis. BONES AND SOFT TISSUES: Exaggerated thoracic kyphosis. Advanced degenerative changes of the right shoulder. No acute osseous abnormality. IMPRESSION: 1. Hypoinflated  lungs and possible mild vascular congestion. 2. Mild cardiomegaly with aortic atherosclerosis. Electronically signed by: Toribio Agreste MD 01/07/2024 04:03 PM EST RP Workstation: [id number]      Assessment/Plan   Principal Problem:   Acute on chronic diastolic CHF (congestive heart failure) (HCC) Active Problems:   Paroxysmal atrial fibrillation (HCC)   CKD stage 3b, GFR 30-44 ml/min (HCC)   SOB (shortness of breath)   HLD (hyperlipidemia)   Depression   Generalized weakness   Elevated troponin   Acute on chronic anemia      #) Acute on chronic diastolic heart failure: dx of acute decompensation on the basis of presenting with 4 days of progressive shortness of breath associate with orthopnea, worsening edema, with elevated proBNP, as well as chest x-ray showing evidence of increased pulmonary vascular congestion. This is in the context of a known history of chronic diastolic heart failure, with most recent echocardiogram performed November 2020, which showed LVEF 55 to 60%, grade 3 diastolic dysfunction as well as mildly reduced right  ventricular systolic function.  Etiology leading to presenting acutely decompensated heart failure is not entirely clear at this time.  She is at increased risk for worsening heart failure in the context of her moderate mitral regurgitation.  Will pursue updated echocardiogram to further evaluate.  Her acute on chronic anemia is noted, although it is suspected that the interval decline in hemoglobin stems from a delusional component given presenting acute on chronic diastolic heart failure.  However, as acute on chronic anemia can contribute to acutely decompensated heart failure, also pursue further evaluation of this interval decline in hemoglobin, as outlined below.  I suspect that mildly elevated initial troponin is a consequence of underlying acutely decompensated heart failure as opposed to representing ACS causing presenting acute heart failure exacerbation, particularly in the absence of any recent CP, and with presenting EKG showing no evidence of acute ischemic changes.  Repeat troponin is now trending down, as quantified above.  Patient conveys good compliance with home diuretic therapy, which consists of Lasix  40 mg p.o. daily.  Will also pursue further optimization of afterload reduction.  For now, given her chronic right-sided heart failure as well as previous echocardiogram showing evidence of pulmonary hypertension, will refrain initially from venous dilatory agents.   She received Lasix  40 mg IV x 1 dose at [facility] earlier today.  In terms of consideration for other contributions towards her presenting shortness of breath, presented chest x-ray showed no evidence of pneumothorax nor any evidence of infiltrate to suggest pneumonia.  Will also check procalcitonin to further evaluate the latter.  COVID, influenza, RSV PCR were all negative.  Plan: monitor strict I's & O's and daily weights. Monitor on telemetry. CMP in the morning, including for monitoring trend of potassium,  bicarbonate, and renal function in response to interval diuresis efforts.  Check magnesium level.  Close monitoring of ensuing blood pressure response to diuresis efforts, including to help guide need for improvement in afterload reduction in order to optimize cardiac output.  Resume home hydralazine  and losartan .  Lasix  40 mg IV twice daily.  Echocardiogram in the morning.  BMP in the morning.  Check procalcitonin level.  Further evaluation management of presenting acute on chronic anemia.                           #)  Generalized weakness: 3 to 4-day duration of generalized weakness, in the absence of any evidence of acute focal neurologic deficits, including no evidence of acute focal weakness to suggest acute CVA.  Suspect contribution from physiologic stress stemming from presenting acute on chronic diastolic heart failure.  No e/o additional infectious process at this time, including urinalysis that is inconsistent with UTI, while chest x-ray showed no evidence of infiltrate to suggest pneumonia.  Will further evaluate the latter by checking procalcitonin level.  Will further eval for any additional contributions from endocrine/metabolic sources, as detailed below.   Plan: work-up and management of presenting acute on chronic diastolic heart failure, as described above. PT/OT consults ordered for the AM. Fall precautions. CMP/CBC in the AM. Check TSH, serum Mg level. Check B12 level, procalcitonin level.                           #) Acute anemia superimposed on anemia of chronic disease: Relative to her documented history of anemia of chronic disease with baseline hemoglobin range 11-12, with most recent prior hemoglobin data point noted to be 11.4 on 07/19/2023, presenting hemoglobin is found to be low at 9.8, associated Neuraceq/Norocarp properties as well as nonelevated RDW.  No overt evidence of active or contributory bleed at this time.  It is possible  that the relative decline in hemoglobin is hemodilutional in nature in the setting of her presenting acute on chronic diastolic heart failure.  Will pursue additional diagnostic evaluation as well as further trending in hemoglobin as outlined below.  Plan: Repeat CBC in the morning.  Check INR, PTT, noting that the patient is on Eliquis  as an outpatient.  Check iron studies, B12, folic acid level, reticulocyte count.                          #) Paroxysmal atrial fibrillation: Documented history of such. In setting of CHA2DS2-VASc score of  5, there is an indication for chronic anticoagulation for thromboembolic prophylaxis. Consistent with this, patient is chronically anticoagulated on Eliquis . Home AV nodal blocking regimen: None.  Most recent echocardiogram was performed in November 2020 and notable for mildly dilated bilateral atria as well as moderate left regurgitation and moderate tricuspid regurgitation. Presenting EKG is suggestive of sinus rhythm with single PAC.   Plan: monitor strict I's & O's and daily weights. CMP/CBC in AM. Check serum mag level.  Continue outpatient Eliquis .  Monitor on telemetry.  Follow for result of updated echocardiogram ordered for tomorrow morning.                      #) CKD Stage 3B: Documented history of such, with baseline creatinine 1.3-1.7, with presenting creatinine consistent with this baseline.  In the context of plan for additional IV diuresis in the setting of presenting acute on chronic diastolic heart failure, will closely monitor in setting renal function, as outlined below.  Plan: Monitor strict I's and O's and daily weights.  Attempt to avoid nephrotoxic agents.  CMP/magnesium level in the AM.                    #) Hyperlipidemia: documented h/o such. On rosuvastatin  as outpatient.   Plan: continue home statin.                         #) Depression: documented h/o  such. On Lexapro   as outpatient.  Patient's presenting serum sodium level noted to be on the low end of normal at 135.  In the context of plan for additional IV diuresis, will closely monitor and seeing serum sodium trend, as outlined below.  Plan: Resume home Lexapro .  Repeat CMP in the morning.  Monitor strict I's and O's and daily weights.             DVT prophylaxis: Resume home Eliquis  Code Status: Full code Family Communication: none Disposition Plan: Per Rounding Team Consults called: none;  Admission status: inpatient     I SPENT GREATER THAN 75  MINUTES IN CLINICAL CARE TIME/MEDICAL DECISION-MAKING IN COMPLETING THIS ADMISSION.      Eva NOVAK Lorine Iannaccone DO [facility]  From 7PM - 7AM   01/07/2024, 11:05 PM

## 2024-01-07 NOTE — ED Triage Notes (Addendum)
 Arrived to triage in transport chair with family. Complains of generalized weakness and pain in legs. Denies CP, SOB or N/V/D Just states Im weak

## 2024-01-07 NOTE — ED Notes (Signed)
Patient transported to imaging at this time.

## 2024-01-07 NOTE — ED Notes (Addendum)
 Patient transferred from waiting room to ED treatment room. Assuming pt care at this time.

## 2024-01-08 ENCOUNTER — Encounter (HOSPITAL_COMMUNITY): Payer: Self-pay | Admitting: Internal Medicine

## 2024-01-08 ENCOUNTER — Inpatient Hospital Stay (HOSPITAL_COMMUNITY)

## 2024-01-08 DIAGNOSIS — I5031 Acute diastolic (congestive) heart failure: Secondary | ICD-10-CM

## 2024-01-08 LAB — CBC WITH DIFFERENTIAL/PLATELET
Abs Immature Granulocytes: 0.01 K/uL (ref 0.00–0.07)
Basophils Absolute: 0 K/uL (ref 0.0–0.1)
Basophils Relative: 1 %
Eosinophils Absolute: 0 K/uL (ref 0.0–0.5)
Eosinophils Relative: 1 %
HCT: 29.4 % — ABNORMAL LOW (ref 36.0–46.0)
Hemoglobin: 9.9 g/dL — ABNORMAL LOW (ref 12.0–15.0)
Immature Granulocytes: 0 %
Lymphocytes Relative: 29 %
Lymphs Abs: 1 K/uL (ref 0.7–4.0)
MCH: 32.2 pg (ref 26.0–34.0)
MCHC: 33.7 g/dL (ref 30.0–36.0)
MCV: 95.8 fL (ref 80.0–100.0)
Monocytes Absolute: 0.5 K/uL (ref 0.1–1.0)
Monocytes Relative: 14 %
Neutro Abs: 1.9 K/uL (ref 1.7–7.7)
Neutrophils Relative %: 55 %
Platelets: 221 K/uL (ref 150–400)
RBC: 3.07 MIL/uL — ABNORMAL LOW (ref 3.87–5.11)
RDW: 13.6 % (ref 11.5–15.5)
WBC: 3.5 K/uL — ABNORMAL LOW (ref 4.0–10.5)
nRBC: 0 % (ref 0.0–0.2)

## 2024-01-08 LAB — APTT: aPTT: 35 s (ref 24–36)

## 2024-01-08 LAB — IRON AND TIBC
Iron: 54 ug/dL (ref 28–170)
Saturation Ratios: 17 % (ref 10.4–31.8)
TIBC: 318 ug/dL (ref 250–450)
UIBC: 264 ug/dL

## 2024-01-08 LAB — COMPREHENSIVE METABOLIC PANEL WITH GFR
ALT: 16 U/L (ref 0–44)
AST: 28 U/L (ref 15–41)
Albumin: 3.5 g/dL (ref 3.5–5.0)
Alkaline Phosphatase: 42 U/L (ref 38–126)
Anion gap: 13 (ref 5–15)
BUN: 18 mg/dL (ref 8–23)
CO2: 27 mmol/L (ref 22–32)
Calcium: 8.9 mg/dL (ref 8.9–10.3)
Chloride: 97 mmol/L — ABNORMAL LOW (ref 98–111)
Creatinine, Ser: 1.31 mg/dL — ABNORMAL HIGH (ref 0.44–1.00)
GFR, Estimated: 39 mL/min — ABNORMAL LOW (ref >60)
Glucose, Bld: 102 mg/dL — ABNORMAL HIGH (ref 70–99)
Potassium: 3.7 mmol/L (ref 3.5–5.1)
Sodium: 137 mmol/L (ref 135–145)
Total Bilirubin: 0.7 mg/dL (ref 0.0–1.2)
Total Protein: 6.2 g/dL — ABNORMAL LOW (ref 6.5–8.1)

## 2024-01-08 LAB — RETICULOCYTES
Immature Retic Fract: 5.1 % (ref 2.3–15.9)
RBC.: 3.01 MIL/uL — ABNORMAL LOW (ref 3.87–5.11)
Retic Count, Absolute: 34.6 K/uL (ref 19.0–186.0)
Retic Ct Pct: 1.2 % (ref 0.4–3.1)

## 2024-01-08 LAB — FOLATE: Folate: >20 ng/mL (ref >5.9)

## 2024-01-08 LAB — BRAIN NATRIURETIC PEPTIDE: B Natriuretic Peptide: 815.4 pg/mL — ABNORMAL HIGH (ref 0.0–100.0)

## 2024-01-08 LAB — MRSA NEXT GEN BY PCR, NASAL: MRSA by PCR Next Gen: NOT DETECTED

## 2024-01-08 LAB — PHOSPHORUS: Phosphorus: 4.3 mg/dL (ref 2.5–4.6)

## 2024-01-08 LAB — ECHOCARDIOGRAM COMPLETE
Area-P 1/2: 4.31 cm2
Height: 60 in
MV M vel: 6.4 m/s
MV Peak grad: 163.8 mmHg
MV VTI: 2.6 cm2
Radius: 0.4 cm
S' Lateral: 3.2 cm
Single Plane A2C EF: 66.9 %
Weight: 1855.39 [oz_av]

## 2024-01-08 LAB — TSH: TSH: 8.223 u[IU]/mL — ABNORMAL HIGH (ref 0.350–4.500)

## 2024-01-08 LAB — PROTIME-INR
INR: 1.2 (ref 0.8–1.2)
Prothrombin Time: 15.6 s — ABNORMAL HIGH (ref 11.4–15.2)

## 2024-01-08 LAB — PROCALCITONIN: Procalcitonin: <0.1 ng/mL

## 2024-01-08 LAB — MAGNESIUM: Magnesium: 2 mg/dL (ref 1.7–2.4)

## 2024-01-08 LAB — VITAMIN B12: Vitamin B-12: 380 pg/mL (ref 180–914)

## 2024-01-08 LAB — FERRITIN: Ferritin: 24 ng/mL (ref 11–307)

## 2024-01-08 MED ORDER — INFLUENZA VAC SPLIT HIGH-DOSE 0.5 ML IM SUSY: 0.5000 mL | PREFILLED_SYRINGE | INTRAMUSCULAR | Status: DC | PRN

## 2024-01-08 MED ORDER — GERHARDT'S BUTT CREAM
TOPICAL_CREAM | Freq: Three times a day (TID) | CUTANEOUS | Status: DC | PRN
  Filled 2024-01-08: qty 60

## 2024-01-08 MED ORDER — MELATONIN 3 MG PO TABS: 3.0000 mg | ORAL_TABLET | Freq: Once | ORAL | Status: DC

## 2024-01-08 MED ORDER — POTASSIUM CHLORIDE CRYS ER 20 MEQ PO TBCR
40.0000 meq | EXTENDED_RELEASE_TABLET | Freq: Once | ORAL | Status: AC
  Administered 2024-01-08: 40 meq via ORAL
  Filled 2024-01-08 (×2): qty 2

## 2024-01-08 MED ORDER — ROSUVASTATIN CALCIUM 5 MG PO TABS
10.0000 mg | ORAL_TABLET | ORAL | Status: DC
  Administered 2024-01-10: 10 mg via ORAL
  Filled 2024-01-08: qty 2

## 2024-01-08 MED ORDER — ROSUVASTATIN CALCIUM 5 MG PO TABS
10.0000 mg | ORAL_TABLET | Freq: Every day | ORAL | Status: DC
  Administered 2024-01-08: 10 mg via ORAL
  Filled 2024-01-08 (×6): qty 2

## 2024-01-08 MED ORDER — POTASSIUM CHLORIDE CRYS ER 20 MEQ PO TBCR
20.0000 meq | EXTENDED_RELEASE_TABLET | Freq: Two times a day (BID) | ORAL | Status: DC
  Administered 2024-01-09 – 2024-01-10 (×3): 20 meq via ORAL
  Filled 2024-01-08 (×5): qty 1

## 2024-01-08 NOTE — Subjective & Objective (Addendum)
 Pt seen and examined. Met with dtr cathy at bedside. Reviewed [facility] program.  Breathing is better but still SOB.

## 2024-01-08 NOTE — TOC Progression Note (Signed)
 Transition of Care [facility]) - Progression Note    Patient Details  Name: Lorraine Page MRN: [id number] Date of Birth: 20-May-1932  [facility] of [facility]) CM/SW Contact  Roxie KANDICE Stain, RN Phone Number: 01/08/2024, 3:23 PM  Clinical Narrative:    Patient transferring to hospital at home program.                      Expected Discharge Plan and Services                                               Social Drivers of Health (SDOH) Interventions SDOH Screenings   Food Insecurity: No Food Insecurity (01/07/2024)  Housing: Low Risk  (01/07/2024)  Transportation Needs: No Transportation Needs (01/07/2024)  Utilities: Not At Risk (01/07/2024)  Depression (PHQ2-9): Low Risk  (09/11/2022)  Social Connections: Unknown (01/07/2024)  Tobacco Use: Medium Risk (01/07/2024)    Readmission Risk Interventions     No data to display

## 2024-01-08 NOTE — Assessment & Plan Note (Addendum)
 Remains on Eliquis  2.5 mg bid.

## 2024-01-08 NOTE — Progress Notes (Signed)
  Echocardiogram 2D Echocardiogram has been performed.  Koleen KANDICE Popper, RDCS 01/08/2024, 8:22 AM

## 2024-01-08 NOTE — Assessment & Plan Note (Addendum)
 Baseline Scr 1.35-1.65. monitor Scr while on losartan  and IV lasix . Check BMP today and monitor renal function and electrolytes

## 2024-01-08 NOTE — Progress Notes (Addendum)
 PROGRESS NOTE    Lorraine Page  FMW:[id number] DOB: 06-Oct-1932 DOA: 01/07/2024 PCP: Daryl Setter, NP  Subjective: Pt seen and examined. Met with dtr cathy at bedside. Reviewed [facility] program.  Breathing is better but still SOB.   Hospital Course: CC: weakness, pain in legs HPI: Lorraine Page is a 89 y.o. female with medical history significant for chronic diastolic heart failure, paroxysmal atrial fibrillation chronically anticoagulated on Eliquis , essential hypertension, hyperlipidemia, CKD 3B with baseline creatinine 1.3-1.7, anemia of chronic disease with baseline hemoglobin 11-12, who is admitted to [facility] on 01/07/2024 by way of transfer from [facility] with acute on chronic diastolic heart failure after presenting from home to the latter facility complaining of shortness of breath.   The patient reports 3 to 4 days of progressive shortness of breath associated with orthopnea, and worsening of edema in the bilateral lower extremities.  Denies any associated chest pain, palpitations, diaphoresis.  No associated any wheezing, hemoptysis, nor any recent subjective fever, chills, rigors, or generalized myalgias.   Over the last 3 to 4 days, she has also noted generalized weakness in the absence of any acute focal weakness.  Denies any recent dysuria or gross hematuria.   She has a history of chronic diastolic heart failure, with most recent echocardiogram performed in November 2020, which was notable for LVEF 55 to 60%, grade 3 diastolic dysfunction, mildly reduced right ventricular systolic function, mildly dilated bilateral atria, moderate mitral digitation as well as moderate tricuspid regurgitation.  Her patient diuretic regimen consists of Lasix  40 mg p.o. daily.  She is also noted to be on losartan  and hydralazine  at home.   Her cardiac history is also notable for paroxysmal atrial fibrillation for which she is chronically anticoagulated on Eliquis .   Not on any AV nodal blocking agents at home.       ED Course:  Vital signs in the ED were notable for the following: Afebrile; rates in the 70s to 80s; systolic blood pressures in the 130s to 160s; respiratory rate 15-20, oxygen saturation 97 to 100% on room air.   Labs were notable for the following: CMP was notable for the following: Sodium 135, potassium 4.4, bicarbonate 22, creatinine 1.55 compared to most recent prior value of 1.32 on 04/19/2023.  Liver enzymes are within normal limits.  proBNP 2335 without a prior proBNP data point available for point comparison.  High sensitive troponin I was initially 44, with repeat trending down to 42.  CBC notable for white cell count 3900, hemoglobin 9.8 associated Neuraceq/Norocarp properties and nonelevated RDW, relative demonstrates a prior hemoglobin data point of 11.4 on 07/19/2023, platelet count 226.  Lactic acid 1.4, with repeat trending down to 1.0.  Urinalysis showed no evidence of succumbs was leukocyte esterase/nitrate negative and showed no bacteria.  COVID, influenza, RSV PCR were all negative.   Per my interpretation, EKG in ED demonstrated the following: Sinus rhythm with PAC, heart rate 81, normal intervals, no evidence of T wave changes, nonspecific less than 1 mm ST depression limited to V6, otherwise demonstrate no evidence of ST changes, including no evidence of ST elevation.   Imaging in the ED, per corresponding formal radiology read, was notable for the following: 2 view chest x-ray showed cardiomegaly with evidence of increased pulmonary vascular congestion, while showing no evidence of infiltrate, pleural effusion, or pneumothorax.   While in the ED, the following were administered: Initially she received lactated Ringer 's x 1 L bolus, followed by Lasix   40 mg IV x 1 dose, gabapentin  200 mg p.o., and home hydralazine  100 mg p.o. x 1 dose.   Subsequently, the patient was admitted to [facility] for further evaluation management of  presenting acute on chronic diastolic heart failure.   Significant Events: Admitted 01/07/2024 for acute on chronic diastolic CHF exacerbation 01-08-2024 transferred to [facility] program.  Admission Labs: WBC 3.9, HgB 9.8, plt 226 Pro BNP 2335 Lipase 67 Na 135, K 4.4, CO2 of 22, BUN 23, Scr 1.55, glu 115 T. Prot 7.3, alb 4.5, AST 34, ALT 15, alk phos 53, t. Bili 0.3 Lactic acid 1.4 Covid/rsv/flu Negative UA negative, except rare bacteria  Admission Imaging Studies: CXR Hypoinflated  lungs and possible mild vascular congestion. 2. Mild cardiomegaly with aortic atherosclerosis.  Significant Labs: TSH 8.223 B12 of 380 Procalcitonin <0.10 Iron studies    Component Value Date/Time   IRON 54 01/08/2024 0541   TIBC 318 01/08/2024 0541   FERRITIN 24 01/08/2024 0541   IRONPCTSAT 17 01/08/2024 0541   Significant Imaging Studies: ECHO shows Left ventricular ejection fraction, by estimation, is 60 to 65%. The left ventricle has normal function. The left ventricle has no regional wall motion abnormalities. There is moderately elevated pulmonary artery systolic  pressure. The estimated right ventricular systolic pressure is 46.3 mmHg. Comparison(s): Changes from prior study are noted. No evidence of TR and RV size/function has normalized.   Antibiotic Therapy: Anti-infectives (From admission, onward)    None       Procedures:   Consultants:     Assessment and Plan: * Acute on chronic diastolic congestive heart failure (HCC) 01/08/24 remains on IV lasix  40 mg bid. Pt screens for [facility] program. Pt's son Kaylan Yates, PA(is a cardiology APP with [employer]) and pt's dtr Donny Ferrier are both agreeable to program.  Dtr will be able to stay with patient but will need to leave the pt's home intermittent to go and care for her dogs. Dtr lives only 5 mins away from patient.  Pt can use cellphone but unable to use tablet computer.  Dtr aware that she will need to be present with patient for  video visit and to assist patient at night. Dtr asking if night-time Purewick is a possibility to help with pt not having to get up in the middle of the night.  Dtr aware that pt will need to ambulate to the bathroom during the day. Awaiting echo.  **update. Echo shows LVEF 60%. Dtr and pt have consented to [facility] program.   Acute on chronic anemia 01/08/24 iron studies do not show iron deficiency. No B12 or folate deficiency either. Last CBC from 2023. Will need outpatient f/u.  Anemia Workup Lab Results  Component Value Date/Time   VITAMINB12 380 01/08/2024 05:41 AM   FOLATE >20.0 01/08/2024 05:41 AM   FERRITIN 24 01/08/2024 05:41 AM   TIBC 318 01/08/2024 05:41 AM   IRON 54 01/08/2024 05:41 AM   RETICCTPCT 1.2 01/08/2024 05:41 AM      Generalized weakness 01/08/24 due to CHF exacerbation. PT/OT consulted.  SOB (shortness of breath) 01/08/24 due to CHF exacerbation. On RA.  Elevated troponin 01/08/24 troponins are flat. Likely due to CKD 3b vs demand ischemia from CHF. Favor CKD 3b as the cause. No other Troponin values in Epic to compare.  Recurrent major depression in partial remission 01/08/24 continue lexapro  10 mg daily.  HLD (hyperlipidemia) 01/08/24 continue crestor  10 mg daily.  Pulmonary hypertension, moderate to severe (HCC) 01/08/24 follows with  Dr. Pietro with cardiology. Awaiting echo. Not on supplemental O2. Not on betablocker due to hx of bradycardia while taking beta-blockers.  Essential (primary) hypertension 01/08/24  on hydralazine  100 mg tid, losartan  100 mg daily.  CKD stage 3b, GFR 30-44 ml/min (HCC) - baseline Scr 1.35-1.65 01/08/24 baseline Scr 1.35-1.65. monitor Scr while on losartan  and IV lasix .  Chronic anticoagulation - on eliquis  for PAF 01/08/24 remains on Eliquis  2.5 mg bid.  Paroxysmal atrial fibrillation (HCC) 01/08/24 on eliquis  2.5 mg bid. Not on betablocker due to bradycardia in the past while on beta-blockers.      DVT  prophylaxis: apixaban  (ELIQUIS ) tablet 2.5 mg Start: 01/07/24 2200 apixaban  (ELIQUIS ) tablet 2.5 mg  Eliquis    Code Status: Full Code Family Communication: discussed with pt, son Vivyan Biggers and dtr cathy Londo Disposition Plan: transfer to hospital at home program Reason for continuing need for hospitalization: remains on IV lasix  40 mg bid.  Objective: Vitals:   01/08/24 0208 01/08/24 0300 01/08/24 0700 01/08/24 0908  BP:  133/60 (!) 168/65 (!) 161/76  Pulse: 64 64 78   Resp: 16 14 17    Temp:  98.1 F (36.7 C) 98.1 F (36.7 C)   TempSrc:  Oral Oral   SpO2: 95% 95% 94%   Weight: 52.6 kg     Height:        Intake/Output Summary (Last 24 hours) at 01/08/2024 1149 Last data filed at 01/08/2024 0411 Gross per 24 hour  Intake 500 ml  Output 2150 ml  Net -1650 ml   Filed Weights   01/07/24 1557 01/07/24 2239 01/08/24 0208  Weight: 57.1 kg 52.6 kg 52.6 kg    Examination:  Physical Exam Vitals and nursing note reviewed.  Constitutional:      General: She is not in acute distress.    Appearance: She is not toxic-appearing or diaphoretic.  HENT:     Head: Normocephalic and atraumatic.     Nose: Nose normal.  Eyes:     General: No scleral icterus. Cardiovascular:     Rate and Rhythm: Normal rate and regular rhythm.     Heart sounds: Murmur heard.     Comments: Soft SEM LLSB 1/6 Pulmonary:     Effort: Pulmonary effort is normal. No respiratory distress.     Breath sounds: Examination of the right-lower field reveals rales. Examination of the left-lower field reveals rales. Rales present.  Abdominal:     General: Bowel sounds are normal. There is no distension.     Palpations: Abdomen is soft.  Musculoskeletal:     Right lower leg: Edema present.     Left lower leg: Edema present.     Comments: Trace ankle bilateral edema  Skin:    General: Skin is warm and dry.     Capillary Refill: Capillary refill takes less than 2 seconds.  Neurological:     General: No focal  deficit present.     Mental Status: She is alert and oriented to person, place, and time.     Data Reviewed: I have personally reviewed following labs and imaging studies  CBC: Recent Labs  Lab 01/07/24 1516 01/08/24 0541  WBC 3.9* 3.5*  NEUTROABS 2.3 1.9  HGB 9.8* 9.9*  HCT 29.9* 29.4*  MCV 96.8 95.8  PLT 226 221   Basic Metabolic Panel: Recent Labs  Lab 01/07/24 1516 01/08/24 0541  NA 135 137  K 4.4 3.7  CL 99 97*  CO2 22 27  GLUCOSE 115* 102*  BUN 23  18  CREATININE 1.55* 1.31*  CALCIUM  9.0 8.9  MG  --  2.0  PHOS  --  4.3   GFR: Estimated Creatinine Clearance: 20.9 mL/min (A) (by C-G formula based on SCr of 1.31 mg/dL (H)). Liver Function Tests: Recent Labs  Lab 01/07/24 1516 01/08/24 0541  AST 34 28  ALT 15 16  ALKPHOS 53 42  BILITOT 0.3 0.7  PROT 7.3 6.2*  ALBUMIN  4.5 3.5   Recent Labs  Lab 01/07/24 1516  LIPASE 67*    Coagulation Profile: Recent Labs  Lab 01/08/24 0541  INR 1.2   ProBNP, BNP (last 5 results) Recent Labs    01/07/24 1516 01/08/24 0541  PROBNP 2,335.0*  --   BNP  --  815.4*   Thyroid  Function Tests: Recent Labs    01/08/24 0541  TSH 8.223*   Anemia Panel: Recent Labs    01/08/24 0541  VITAMINB12 380  FOLATE >20.0  FERRITIN 24  TIBC 318  IRON 54  RETICCTPCT 1.2   Sepsis Labs: Recent Labs  Lab 01/07/24 1516 01/07/24 1717 01/08/24 0541  PROCALCITON  --   --  <0.10  LATICACIDVEN 1.4 1.0  --     Recent Results (from the past 240 hours)  Resp panel by RT-PCR (RSV, Flu A&B, Covid) Anterior Nasal Swab     Status: None   Collection Time: 01/07/24  3:16 PM   Specimen: Anterior Nasal Swab  Result Value Ref Range Status   SARS Coronavirus 2 by RT PCR NEGATIVE NEGATIVE Final    Comment: (NOTE) SARS-CoV-2 target nucleic acids are NOT DETECTED.  The SARS-CoV-2 RNA is generally detectable in upper respiratory specimens during the acute phase of infection. The lowest concentration of SARS-CoV-2 viral copies  this assay can detect is 138 copies/mL. A negative result does not preclude SARS-Cov-2 infection and should not be used as the sole basis for treatment or other patient management decisions. A negative result may occur with  improper specimen collection/handling, submission of specimen other than nasopharyngeal swab, presence of viral mutation(s) within the areas targeted by this assay, and inadequate number of viral copies(<138 copies/mL). A negative result must be combined with clinical observations, patient history, and epidemiological information. The expected result is Negative.  Fact Sheet for Patients:  bloggercourse.com  Fact Sheet for Healthcare Providers:  seriousbroker.it  This test is no t yet approved or cleared by the [place]  FDA and  has been authorized for detection and/or diagnosis of SARS-CoV-2 by FDA under an Emergency Use Authorization (EUA). This EUA will remain  in effect (meaning this test can be used) for the duration of the COVID-19 declaration under Section 564(b)(1) of the Act, 21 U.S.C.section 360bbb-3(b)(1), unless the authorization is terminated  or revoked sooner.       Influenza A by PCR NEGATIVE NEGATIVE Final   Influenza B by PCR NEGATIVE NEGATIVE Final    Comment: (NOTE) The Xpert Xpress SARS-CoV-2/FLU/RSV plus assay is intended as an aid in the diagnosis of influenza from Nasopharyngeal swab specimens and should not be used as a sole basis for treatment. Nasal washings and aspirates are unacceptable for Xpert Xpress SARS-CoV-2/FLU/RSV testing.  Fact Sheet for Patients: bloggercourse.com  Fact Sheet for Healthcare Providers: seriousbroker.it  This test is not yet approved or cleared by the [place]  FDA and has been authorized for detection and/or diagnosis of SARS-CoV-2 by FDA under an Emergency Use Authorization (EUA). This EUA  will remain in effect (meaning this test can be used) for the duration  of the COVID-19 declaration under Section 564(b)(1) of the Act, 21 U.S.C. section 360bbb-3(b)(1), unless the authorization is terminated or revoked.     Resp Syncytial Virus by PCR NEGATIVE NEGATIVE Final    Comment: (NOTE) Fact Sheet for Patients: bloggercourse.com  Fact Sheet for Healthcare Providers: seriousbroker.it  This test is not yet approved or cleared by the [place]  FDA and has been authorized for detection and/or diagnosis of SARS-CoV-2 by FDA under an Emergency Use Authorization (EUA). This EUA will remain in effect (meaning this test can be used) for the duration of the COVID-19 declaration under Section 564(b)(1) of the Act, 21 U.S.C. section 360bbb-3(b)(1), unless the authorization is terminated or revoked.  Performed at [facility], [address] [place], [place] [address]   MRSA Next Gen by PCR, Nasal     Status: None   Collection Time: 01/07/24 10:54 PM   Specimen: Nasal Mucosa; Nasal Swab  Result Value Ref Range Status   MRSA by PCR Next Gen NOT DETECTED NOT DETECTED Final    Comment: (NOTE) The GeneXpert MRSA Assay (FDA approved for NASAL specimens only), is one component of a comprehensive MRSA colonization surveillance program. It is not intended to diagnose MRSA infection nor to guide or monitor treatment for MRSA infections. Test performance is not FDA approved in patients less than 42 years old. Performed at [facility], [address] [place], [place] [address]      Radiology Studies: ECHOCARDIOGRAM COMPLETE Result Date: 01/08/2024    ECHOCARDIOGRAM REPORT   Patient Name:   EVADEAN SPROULE Shults Date of Exam: 01/08/2024 Medical Rec #:  [id number]        Height:       60.0 in Accession #:    [id number]       Weight:       116.0 lb Date of Birth:  1932/05/27       BSA:          1.481 m Patient Age:    89 years          BP:           168/65 mmHg Patient Gender: F                HR:           75 bpm. Exam Location:  Inpatient Procedure: 2D Echo, Cardiac Doppler and Color Doppler (Both Spectral and Color            Flow Doppler were utilized during procedure). Indications:    CHF-Acute Diastolic I50.31  History:        Patient has prior history of Echocardiogram examinations, most                 recent 12/23/2018. CKD, CVA, Signs/Symptoms:Shortness of Breath;                 Risk Factors:Dyslipidemia, Hypertension and Eleavted Troponin,                 Pre-diabetes.  Sonographer:    Koleen Popper RDCS Referring Phys: [id number] EVA NOVAK PORE  Sonographer Comments: Image acquisition challenging due to patient body habitus. IMPRESSIONS  1. Left ventricular ejection fraction, by estimation, is 60 to 65%. The left ventricle has normal function. The left ventricle has no regional wall motion abnormalities. Left ventricular diastolic function could not be evaluated.  2. Right ventricular systolic function is normal. The right ventricular size is normal. There is moderately elevated  pulmonary artery systolic pressure. The estimated right ventricular systolic pressure is 46.3 mmHg.  3. Left atrial size was severely dilated.  4. The mitral valve is normal in structure. Mild mitral valve regurgitation. Mild mitral stenosis. Severe mitral annular calcification.  5. The aortic valve was not well visualized. Aortic valve regurgitation is not visualized. Aortic valve sclerosis/calcification is present, without any evidence of aortic stenosis.  6. The inferior vena cava is normal in size with greater than 50% respiratory variability, suggesting right atrial pressure of 3 mmHg. Comparison(s): Changes from prior study are noted. No evidence of TR and RV size/function has normalized. FINDINGS  Left Ventricle: Left ventricular ejection fraction, by estimation, is 60 to 65%. The left ventricle has normal function. The left ventricle has no  regional wall motion abnormalities. The left ventricular internal cavity size was normal in size. There is  no left ventricular hypertrophy. Left ventricular diastolic function could not be evaluated due to mitral annular calcification (moderate or greater). Left ventricular diastolic function could not be evaluated. Right Ventricle: The right ventricular size is normal. No increase in right ventricular wall thickness. Right ventricular systolic function is normal. There is moderately elevated pulmonary artery systolic pressure. The tricuspid regurgitant velocity is 3.29 m/s, and with an assumed right atrial pressure of 3 mmHg, the estimated right ventricular systolic pressure is 46.3 mmHg. Left Atrium: Left atrial size was severely dilated. Right Atrium: Right atrial size was normal in size. Pericardium: There is no evidence of pericardial effusion. Mitral Valve: The mitral valve is normal in structure. Severe mitral annular calcification. Mild mitral valve regurgitation. Mild mitral valve stenosis. MV peak gradient, 13.4 mmHg. The mean mitral valve gradient is 5.0 mmHg with average heart rate of 76  bpm. Tricuspid Valve: The tricuspid valve is normal in structure. Tricuspid valve regurgitation is not demonstrated. No evidence of tricuspid stenosis. Aortic Valve: The aortic valve was not well visualized. Aortic valve regurgitation is not visualized. Aortic valve sclerosis/calcification is present, without any evidence of aortic stenosis. Pulmonic Valve: The pulmonic valve was not well visualized. Pulmonic valve regurgitation is not visualized. No evidence of pulmonic stenosis. Aorta: The aortic root is normal in size and structure. Venous: The inferior vena cava is normal in size with greater than 50% respiratory variability, suggesting right atrial pressure of 3 mmHg. IAS/Shunts: No atrial level shunt detected by color flow Doppler.  LEFT VENTRICLE PLAX 2D LVIDd:         4.80 cm     Diastology LVIDs:         3.20  cm     LV e' medial:    7.14 cm/s LV PW:         0.90 cm     LV E/e' medial:  23.8 LV IVS:        0.80 cm     LV e' lateral:   8.68 cm/s LVOT diam:     1.60 cm     LV E/e' lateral: 19.6 LV SV:         55 LV SV Index:   37 LVOT Area:     2.01 cm  LV Volumes (MOD) LV vol d, MOD A2C: 96.2 ml LV vol s, MOD A2C: 31.8 ml LV SV MOD A2C:     64.4 ml RIGHT VENTRICLE             IVC RV Basal diam:  3.40 cm     IVC diam: 1.80 cm RV S prime:  12.20 cm/s TAPSE (M-mode): 1.9 cm LEFT ATRIUM             Index        RIGHT ATRIUM           Index LA diam:        4.10 cm 2.77 cm/m   RA Area:     12.10 cm LA Vol (A2C):   71.9 ml 48.56 ml/m  RA Volume:   22.70 ml  15.33 ml/m LA Vol (A4C):   70.8 ml 47.82 ml/m LA Biplane Vol: 74.7 ml 50.45 ml/m  AORTIC VALVE LVOT Vmax:   137.00 cm/s LVOT Vmean:  88.700 cm/s LVOT VTI:    0.274 m  AORTA Ao Root diam: 2.60 cm MITRAL VALVE                  TRICUSPID VALVE MV Area (PHT): 4.31 cm       TR Peak grad:   43.3 mmHg MV Area VTI:   2.60 cm       TR Vmax:        329.00 cm/s MV Peak grad:  13.4 mmHg MV Mean grad:  5.0 mmHg       SHUNTS MV Vmax:       1.83 m/s       Systemic VTI:  0.27 m MV Vmean:      107.0 cm/s     Systemic Diam: 1.60 cm MV VTI:        0.21 m MV Decel Time: 176 msec MR Peak grad:    163.8 mmHg MR Vmax:         640.00 cm/s MR Vmean:        492.0 cm/s MR PISA Nyquist: 0.3 m/s MR PISA:         1.01 cm MR PISA Radius:  0.40 cm MV E velocity: 170.00 cm/s MV A velocity: 89.50 cm/s MV E/A ratio:  1.90 Franck Azobou Tonleu Electronically signed by Joelle Ren Ny Signature Date/Time: 01/08/2024/11:03:06 AM    Final    DG Chest 2 View Result Date: 01/07/2024 EXAM: 2 VIEW(S) XRAY OF THE CHEST 01/07/2024 03:55:31 PM COMPARISON: Chest x ray 03/25/2023. CLINICAL HISTORY: weakness FINDINGS: LUNGS AND PLEURA: Lungs are hypoinflated with possible mild vascular congestion. No lobar consolidation or effusion. No pneumothorax. HEART AND MEDIASTINUM: Mild cardiomegaly. Aortic  atherosclerosis. BONES AND SOFT TISSUES: Exaggerated thoracic kyphosis. Advanced degenerative changes of the right shoulder. No acute osseous abnormality. IMPRESSION: 1. Hypoinflated  lungs and possible mild vascular congestion. 2. Mild cardiomegaly with aortic atherosclerosis. Electronically signed by: Toribio Agreste MD 01/07/2024 04:03 PM EST RP Workstation: [id number]    Scheduled Meds:  apixaban   2.5 mg Oral BID   escitalopram   20 mg Oral QPM   furosemide   40 mg Intravenous BID   gabapentin   200 mg Oral TID   hydrALAZINE   100 mg Oral TID   losartan   100 mg Oral Daily   [START ON 01/09/2024] potassium chloride  20 mEq Oral BID   potassium chloride  40 mEq Oral Once   rosuvastatin   10 mg Oral Daily   Continuous Infusions:   LOS: 1 day   Time spent: 55 minutes  Camellia Door, DO  [facility]  01/08/2024, 11:49 AM   Hospital at Home Admission Criteria Checklist:  Formal consent explained in detail and signed at the bedside: yes Patient meets inpatient admission criteria (see below for further details) yes Is pt Medicare FFS/[employer] Medicare-Medicaid, [employer], [employer] ( required for  initial launch with plan to expand)? yes Lives within 25 mil/ 30 min from [facility] within [place](pt may stay with family member during admission who lives within 25 miles or 30 min from [facility] w/in [facility])? yes Hemodynamically stable with relatively low risk of clinical deterioration-not requiring ICU? yes Age >55? yes Does not require frequent touch-points or complex interventions/medications (ie Titrated Infusions (IV insulin, heparin drips, vasoactive drips, use of infused or injectable controlled substances, patients on insulin)? yes Any Behavioral Health comorbidities likely to increase risk for in-home care (ie Acute delirium or experiencing a marked altered mental status and cause is not a treatable condition in the home)? no Has the patient been on BIPAP during course of ED  evaluation or hospitalization? no IF YES, Has the patient been off of BIPAP for >24 hours(If NO-THEN PATIENT DOES MEET INCLUSION CRITERIA)? not applicable On Room Air or Needs oxygen at home (<6L)? is not on home oxygen therapy. Active safety concerns (ie Unable to use bedside commode independently and lacks caregiver support for safety- needs SNF placement, unable to obtain IV access)? no Has skin check been performed? yes  Has Physical Therapy screened the patient? yes  Common admission diagnoses including: CAP, COPD Exacerbation, Acute on chronic heart failure, Cellulitis, UTI , dehydration, acute resp failure with hypoxia (requiring <5L)   Social Screening:  - Has the family been directly contacted about Hospital at Home program with consent obtained (if yes- please document who was spoken to with name and phone number)? yes  -Was the family approached about the use of [facility] pharmacy for medications at discharge? yes Denies significant ETOH intake? yes Does not smoke and understands may not smoke in the presence of oxygen? yes Patient states able to use iPad/phone for communication/has family who is able to use? yes Patient has agreed to be compliant with medication and treatment regimen of the program? yes Any active drug use in patient or primary caregiver including daily dosing of methadone? no Stable home environment ( access to appropriate heating in cold conditions and/or appropriate air conditioning in hot conditions and/or no running water/electricity)? yes  No aggressive pets at home? no Firearm present? no  With ability or willingness to store them unloaded in a locked case for duration of hospitalization? not applicable Ambulatory? yes  ambulates with a walker Bed bugs present on home evaluation? no Family support system in place? yes Patient feels safe at home and does not endorse any violence? yes Any actively decompensated behavioral health issues including  agitation/aggressive behavior? no  Patient requests food to be provided by hospital home program? no PT/OT eval completed and not requiring SNF, ALF, inpatient rehab? yes  To be admitted to the [facility] program, a patient generally must meet the following: 1. Requirement for Inpatient Level of Care: The patient's condition must necessitate an inpatient level of care. This is typically indicated by one or more of the following, depending on their specific diagnosis:  Persistent tachycardia despite appropriate treatment (e.g., for Heart Failure, UTI). Persistent tachypnea (rapid breathing) or dyspnea (shortness of breath) that hasn't improved sufficiently with observation care (e.g., for Heart Failure, Pneumonia, Viral Illness, COVID). Hypoxemia (low oxygen levels), such as a new need for oxygen, an increased need from baseline, or specific oxygen saturation levels (e.g., SpO2 <90-94% depending on the condition) that persist despite observation (e.g., for Heart Failure, COPD, Pneumonia, Viral Illness, COVID). Need for Intravenous (IV) hydration due to an inability to maintain oral hydration, which persists  despite observation care (e.g., for Cellulitis, UTI, Viral Illness, COVID). Specific to Heart Failure: Persistent pulmonary edema, indicated by a new oxygen need, lack of improvement with IV diuretics, and ongoing tachypnea/dyspnea. Specific to COPD: A decrease in known baseline resting oxygen saturation (SpO2) by 4% or more, or an increase in pre-existing supplemental oxygen requirements, which persists despite observation and requires continued close monitoring. Specific to Pneumonia: A Pneumonia Severity Index (PSI) class IV (moderate risk). Specific to Cellulitis: Failure of outpatient antibiotic therapy (indicated by progression or no improvement after a minimum of 48 hours on an adequate regimen) or a clinical presentation (like acuity or rapidity of progression) that requires the  intensity of monitoring found in an inpatient setting. Specific to UTI: Persistence or worsening of clinical findings like fever, pain, or dehydration despite observation care; presence of significant uropathy; suspected infection of an indwelling prosthetic device, stent, implant, or graft; or pregnancy with suspected pyelonephritis.  2. Appropriateness for Hospital at Home Setting: The patient's overall clinical picture, including the severity of their illness, their care needs, and their medical history and comorbidities, must be suitable for management in the Hospital at Home environment. This essentially means that none of the exclusion criteria (listed below) are met.  Unified Exclusion Criteria for Hospital at Home Admission: A patient would not be eligible for Hospital at Home if any of the following are present: Hemodynamic Instability: Hypotension (low blood pressure) is present. Respiratory Instability or Needs Beyond Program Capability: There is a new need for invasive or noninvasive ventilatory assistance (like BiPAP or a ventilator). Oxygenation is not sufficient, generally indicated if an FiO2 (fraction of inspired oxygen) of 45% (which is about 6 Liters/minute via nasal cannula) or more is required to keep oxygen saturation (SpO2) at 90% or greater. Monitoring or Procedural Needs Beyond Program Capability: There is a need for invasive monitoring, such as a pulmonary artery catheter or an arterial line. There is a need for immediate-response telemetry monitoring (for dangerous arrhythmia detection and subsequent immediate intervention). The required medication regimen is beyond the capabilities of Hospital at Home (e.g., dosing intervals are too frequent for home administration). There is a need for a procedure that cannot be performed by the [facility] team (e.g., significant wound debridement or abscess drainage for cellulitis, or percutaneous nephrostomy for a complicated  UTI). Significant Organ Dysfunction or Markers of Severe Illness: Mental status is not at baseline, or there is altered mental status suggestive of inadequate perfusion. Renal (kidney) function is unstable or showing an ongoing decline. There is evidence of inadequate perfusion, such as metabolic acidosis or myocardial ischemia. Uncompensated acidosis is present. Condition-Specific Severity or Complications Making Home Care Unsuitable: For Heart Failure: Known severe cardiac valvular disease (e.g., aortic stenosis, mitral regurgitation); or severe peripheral edema that impairs the ability to urinate or ambulate. For COPD: Known concurrent comorbidity or finding that indicates a higher-risk COPD exacerbation (e.g., pulmonary fibrosis, cavitation, pleural effusion, pneumothorax, rib fracture). For Pneumonia: Pneumonia Severity Index (PSI) class V (indicating high risk for inpatient mortality); known concurrent comorbidity or finding that indicates higher-risk pneumonia (e.g., pulmonary fibrosis, cavitation, large or loculated pleural effusion); or a concomitant serious infectious process like endocarditis or empyema. For Cellulitis: Orbital, periorbital, or necrotizing infection is suspected; or a concomitant serious infectious process like endocarditis, septic emboli, or septic joint space infection. For UTI: Urinary tract obstruction (e.g., kidney stone, bladder outlet obstruction); or a concomitant serious infectious process like endocarditis or septic emboli. For Viral Illness & COVID-19: A  concomitant serious infectious process like endocarditis or empyema.  General Comorbidities or Status:  The patient is significantly immunosuppressed (this applies to Pneumonia, Cellulitis, UTI, Viral Illness, and COVID-19). The patient meets inpatient admission criteria for a second diagnosis, or has care needs beyond the capabilities of Hospital at Home due to an active clinically significant comorbidity.  (This is a general exclusion across all listed conditions)

## 2024-01-08 NOTE — Hospital Course (Addendum)
 CC: weakness, pain in legs HPI: Lorraine Page is a 89 y.o. female with medical history significant for chronic diastolic heart failure, Pfib, chronically anticoagulated on Eliquis , essential hypertension, hyperlipidemia, CKD 3B with baseline creatinine 1.3-1.7, anemia of chronic disease with baseline hemoglobin 11-12, who is admitted to [facility] on 01/07/2024 by way of transfer from [facility] with acute on chronic diastolic heart failure after presenting from home to the latter facility complaining of shortness of breath.  She has a history of chronic diastolic heart failure, with most recent echocardiogram performed in November 2020, which was notable for LVEF 55 to 60%, grade 3 diastolic dysfunction, mildly reduced right ventricular systolic function, mildly dilated bilateral atria, moderate mitral digitation as well as moderate tricuspid regurgitation.  Her cardiac history is also notable for paroxysmal atrial fibrillation for which she is chronically anticoagulated on Eliquis .  Not on any AV nodal blocking agents at home.  12/2: pt presented to ED for weaknes 12/2: pt admitted to [facility] for [facility] diastolic heart failure 12/3: transferred to H@H  service   12/4: I assumed care of the patient. Strict I/Os initiated.  Addendum at 1300: sCr noted to be elevated to 1.89/eGFR 25 from 1.31/39 yesterday. Stop evening lasix  20 mg IV. NS 500 ml bolus scheduled for 1700. Recheck BMP in the AM.  12/5: We will recheck BMP today and evaluate patient's renal function. If indicated we will give her fluids this evening or administer an additional dose of lasix  in the evening medic visit.  Pt has planned virtual visit with PT today at approximately 3p.   HH PT, RN, and nurse aide placed for patient for discharge. TOC consulted.  12/6: Lasix  20 mg PO once today. No kchlor po indicated given serum potassium yesterday was 5. Patient is ready for discharged today.

## 2024-01-08 NOTE — Assessment & Plan Note (Addendum)
 Troponins: 44 and then 42; Likely due demand ischemia from CHF. No other Troponin values in Epic to compare. Low clinical suspicion for ACS given no chest pain and now no shortness of breath and EKG on 12/2 reviewed with negative for ischemic changes

## 2024-01-08 NOTE — Plan of Care (Signed)

## 2024-01-08 NOTE — Assessment & Plan Note (Addendum)
 Secondary to CHF exacerbation, currently on RA

## 2024-01-08 NOTE — Progress Notes (Signed)
 PROGRESS NOTE    Lorraine Page  FMW:[id number] DOB: December 16, 1932 DOA: 01/07/2024 PCP: Daryl Setter, NP  Subjective: Patient seen at bedside. She reports feeling better, denied any shortness of breath. Has been urinating   Hospital Course: CC: weakness, pain in legs HPI: Lorraine Page is a 89 y.o. female with medical history significant for chronic diastolic heart failure, paroxysmal atrial fibrillation chronically anticoagulated on Eliquis , essential hypertension, hyperlipidemia, CKD 3B with baseline creatinine 1.3-1.7, anemia of chronic disease with baseline hemoglobin 11-12, who is admitted to [facility] ([facility] on 01/07/2024 by way of transfer from [facility] with acute on chronic diastolic heart failure after presenting from home to the latter facility complaining of shortness of breath.  She has a history of chronic diastolic heart failure, with most recent echocardiogram performed in November 2020, which was notable for LVEF 55 to 60%, grade 3 diastolic dysfunction, mildly reduced right ventricular systolic function, mildly dilated bilateral atria, moderate mitral digitation as well as moderate tricuspid regurgitation.  Her cardiac history is also notable for paroxysmal atrial fibrillation for which she is chronically anticoagulated on Eliquis .  Not on any AV nodal blocking agents at home.     Assessment and Plan: Acute on chronic diastolic congestive heart failure (HCC) - had SOB, orthopnea, edema, elevated proBNP and CXR showing vascular congestion. S/p IV lasix  with good UOP. Etiology of exacerbation is unclear at this time, does have known mitral regurgitation and pulmonary HTN - continue IV lasix  40 mg BID - strict I/Os - TTE completed on 01/08/24 and showed EF 60-65% with mild mitral regurgitation, mild mitral stenosis and severe mitral annular calcification  - she has consented for [facility] program  - not on BB due to h/o bradycardia  Acute on chronic  anemia - no acute sign of bleeding, Hb stable in the 9 range from admission but lower than her baseline of 11-12 - outpatient workup   Elevated troponin 01/08/24 troponins are flat. Likely due to CKD 3b vs demand ischemia from CHF. Favor CKD 3b as the cause. No other Troponin values in Epic to compare.  Recurrent major depression in partial remission - continue lexapro  10 mg daily.  HLD (hyperlipidemia) - continue crestor  10 mg daily.  Pulmonary hypertension, moderate to severe (HCC) - follows with Dr. Pietro with cardiology. Not on betablocker due to hx of bradycardia while taking beta-blockers - f/u outpatient  Essential (primary) hypertension - on hydralazine  100 mg tid, losartan  100 mg daily.  CKD stage 3b, GFR 30-44 ml/min (HCC) - baseline Scr 1.35-1.65 - monitor Scr while on losartan  and IV lasix .  Chronic anticoagulation - on eliquis  for PAF - on Eliquis  2.5 mg bid.  Paroxysmal atrial fibrillation (HCC) - on eliquis  2.5 mg bid. Not on betablocker due to bradycardia in the past while on beta-blockers.      DVT prophylaxis: apixaban  (ELIQUIS ) tablet 2.5 mg Start: 01/07/24 2200 apixaban  (ELIQUIS ) tablet 2.5 mg  Eliquis    Code Status: Full Code Family Communication: Family updated at bedside  Disposition Plan: [facility] program Reason for continuing need for hospitalization: ongoing symptoms of acute on chronic CHF  Objective: Vitals:   01/08/24 0300 01/08/24 0700 01/08/24 0908 01/08/24 1235  BP: 133/60 (!) 168/65 (!) 161/76 (!) 147/67  Pulse: 64 78  82  Resp: 14 17  18   Temp: 98.1 F (36.7 C) 98.1 F (36.7 C)  98.1 F (36.7 C)  TempSrc: Oral Oral  Oral  SpO2: 95% 94%  96%  Weight:  Height:        Intake/Output Summary (Last 24 hours) at 01/08/2024 1315 Last data filed at 01/08/2024 0411 Gross per 24 hour  Intake 500 ml  Output 2150 ml  Net -1650 ml   Filed Weights   01/07/24 1557 01/07/24 2239 01/08/24 0208  Weight: 57.1 kg 52.6 kg 52.6 kg     Examination:  Physical Exam Vitals and nursing note reviewed.  Constitutional:      General: She is not in acute distress. Cardiovascular:     Rate and Rhythm: Normal rate.  Pulmonary:     Breath sounds: No wheezing or rales.  Abdominal:     General: There is no distension.     Tenderness: There is no abdominal tenderness.  Musculoskeletal:     Comments: Ankle edema     Data Reviewed: I have personally reviewed following labs and imaging studies  CBC: Recent Labs  Lab 01/07/24 1516 01/08/24 0541  WBC 3.9* 3.5*  NEUTROABS 2.3 1.9  HGB 9.8* 9.9*  HCT 29.9* 29.4*  MCV 96.8 95.8  PLT 226 221   Basic Metabolic Panel: Recent Labs  Lab 01/07/24 1516 01/08/24 0541  NA 135 137  K 4.4 3.7  CL 99 97*  CO2 22 27  GLUCOSE 115* 102*  BUN 23 18  CREATININE 1.55* 1.31*  CALCIUM  9.0 8.9  MG  --  2.0  PHOS  --  4.3   GFR: Estimated Creatinine Clearance: 20.9 mL/min (A) (by C-G formula based on SCr of 1.31 mg/dL (H)). Liver Function Tests: Recent Labs  Lab 01/07/24 1516 01/08/24 0541  AST 34 28  ALT 15 16  ALKPHOS 53 42  BILITOT 0.3 0.7  PROT 7.3 6.2*  ALBUMIN  4.5 3.5   Recent Labs  Lab 01/07/24 1516  LIPASE 67*   No results for input(s): AMMONIA in the last 168 hours. Coagulation Profile: Recent Labs  Lab 01/08/24 0541  INR 1.2   Cardiac Enzymes: No results for input(s): CKTOTAL, CKMB, CKMBINDEX, TROPONINI in the last 168 hours. ProBNP, BNP (last 5 results) Recent Labs    01/07/24 1516 01/08/24 0541  PROBNP 2,335.0*  --   BNP  --  815.4*   HbA1C: No results for input(s): HGBA1C in the last 72 hours. CBG: No results for input(s): GLUCAP in the last 168 hours. Lipid Profile: No results for input(s): CHOL, HDL, LDLCALC, TRIG, CHOLHDL, LDLDIRECT in the last 72 hours. Thyroid  Function Tests: Recent Labs    01/08/24 0541  TSH 8.223*   Anemia Panel: Recent Labs    01/08/24 0541  VITAMINB12 380  FOLATE >20.0   FERRITIN 24  TIBC 318  IRON 54  RETICCTPCT 1.2   Sepsis Labs: Recent Labs  Lab 01/07/24 1516 01/07/24 1717 01/08/24 0541  PROCALCITON  --   --  <0.10  LATICACIDVEN 1.4 1.0  --     Recent Results (from the past 240 hours)  Resp panel by RT-PCR (RSV, Flu A&B, Covid) Anterior Nasal Swab     Status: None   Collection Time: 01/07/24  3:16 PM   Specimen: Anterior Nasal Swab  Result Value Ref Range Status   SARS Coronavirus 2 by RT PCR NEGATIVE NEGATIVE Final    Comment: (NOTE) SARS-CoV-2 target nucleic acids are NOT DETECTED.  The SARS-CoV-2 RNA is generally detectable in upper respiratory specimens during the acute phase of infection. The lowest concentration of SARS-CoV-2 viral copies this assay can detect is 138 copies/mL. A negative result does not preclude SARS-Cov-2 infection and should  not be used as the sole basis for treatment or other patient management decisions. A negative result may occur with  improper specimen collection/handling, submission of specimen other than nasopharyngeal swab, presence of viral mutation(s) within the areas targeted by this assay, and inadequate number of viral copies(<138 copies/mL). A negative result must be combined with clinical observations, patient history, and epidemiological information. The expected result is Negative.  Fact Sheet for Patients:  bloggercourse.com  Fact Sheet for Healthcare Providers:  seriousbroker.it  This test is no t yet approved or cleared by the [place]  FDA and  has been authorized for detection and/or diagnosis of SARS-CoV-2 by FDA under an Emergency Use Authorization (EUA). This EUA will remain  in effect (meaning this test can be used) for the duration of the COVID-19 declaration under Section 564(b)(1) of the Act, 21 U.S.C.section 360bbb-3(b)(1), unless the authorization is terminated  or revoked sooner.       Influenza A by PCR NEGATIVE  NEGATIVE Final   Influenza B by PCR NEGATIVE NEGATIVE Final    Comment: (NOTE) The Xpert Xpress SARS-CoV-2/FLU/RSV plus assay is intended as an aid in the diagnosis of influenza from Nasopharyngeal swab specimens and should not be used as a sole basis for treatment. Nasal washings and aspirates are unacceptable for Xpert Xpress SARS-CoV-2/FLU/RSV testing.  Fact Sheet for Patients: bloggercourse.com  Fact Sheet for Healthcare Providers: seriousbroker.it  This test is not yet approved or cleared by the [place]  FDA and has been authorized for detection and/or diagnosis of SARS-CoV-2 by FDA under an Emergency Use Authorization (EUA). This EUA will remain in effect (meaning this test can be used) for the duration of the COVID-19 declaration under Section 564(b)(1) of the Act, 21 U.S.C. section 360bbb-3(b)(1), unless the authorization is terminated or revoked.     Resp Syncytial Virus by PCR NEGATIVE NEGATIVE Final    Comment: (NOTE) Fact Sheet for Patients: bloggercourse.com  Fact Sheet for Healthcare Providers: seriousbroker.it  This test is not yet approved or cleared by the [place]  FDA and has been authorized for detection and/or diagnosis of SARS-CoV-2 by FDA under an Emergency Use Authorization (EUA). This EUA will remain in effect (meaning this test can be used) for the duration of the COVID-19 declaration under Section 564(b)(1) of the Act, 21 U.S.C. section 360bbb-3(b)(1), unless the authorization is terminated or revoked.  Performed at [facility], [address] [place], [place] [address]   MRSA Next Gen by PCR, Nasal     Status: None   Collection Time: 01/07/24 10:54 PM   Specimen: Nasal Mucosa; Nasal Swab  Result Value Ref Range Status   MRSA by PCR Next Gen NOT DETECTED NOT DETECTED Final    Comment: (NOTE) The GeneXpert MRSA Assay (FDA  approved for NASAL specimens only), is one component of a comprehensive MRSA colonization surveillance program. It is not intended to diagnose MRSA infection nor to guide or monitor treatment for MRSA infections. Test performance is not FDA approved in patients less than 8 years old. Performed at [facility], [address] [place], [place] [address]      Radiology Studies: ECHOCARDIOGRAM COMPLETE Result Date: 01/08/2024    ECHOCARDIOGRAM REPORT   Patient Name:   Lorraine Page Date of Exam: 01/08/2024 Medical Rec #:  [id number]        Height:       60.0 in Accession #:    [id number]       Weight:  116.0 lb Date of Birth:  Dec 11, 1932       BSA:          1.481 m Patient Age:    89 years         BP:           168/65 mmHg Patient Gender: F                HR:           75 bpm. Exam Location:  Inpatient Procedure: 2D Echo, Cardiac Doppler and Color Doppler (Both Spectral and Color            Flow Doppler were utilized during procedure). Indications:    CHF-Acute Diastolic I50.31  History:        Patient has prior history of Echocardiogram examinations, most                 recent 12/23/2018. CKD, CVA, Signs/Symptoms:Shortness of Breath;                 Risk Factors:Dyslipidemia, Hypertension and Eleavted Troponin,                 Pre-diabetes.  Sonographer:    Koleen Popper RDCS Referring Phys: [id number] EVA KATHEE PORE  Sonographer Comments: Image acquisition challenging due to patient body habitus. IMPRESSIONS  1. Left ventricular ejection fraction, by estimation, is 60 to 65%. The left ventricle has normal function. The left ventricle has no regional wall motion abnormalities. Left ventricular diastolic function could not be evaluated.  2. Right ventricular systolic function is normal. The right ventricular size is normal. There is moderately elevated pulmonary artery systolic pressure. The estimated right ventricular systolic pressure is 46.3 mmHg.  3. Left atrial size was severely  dilated.  4. The mitral valve is normal in structure. Mild mitral valve regurgitation. Mild mitral stenosis. Severe mitral annular calcification.  5. The aortic valve was not well visualized. Aortic valve regurgitation is not visualized. Aortic valve sclerosis/calcification is present, without any evidence of aortic stenosis.  6. The inferior vena cava is normal in size with greater than 50% respiratory variability, suggesting right atrial pressure of 3 mmHg. Comparison(s): Changes from prior study are noted. No evidence of TR and RV size/function has normalized. FINDINGS  Left Ventricle: Left ventricular ejection fraction, by estimation, is 60 to 65%. The left ventricle has normal function. The left ventricle has no regional wall motion abnormalities. The left ventricular internal cavity size was normal in size. There is  no left ventricular hypertrophy. Left ventricular diastolic function could not be evaluated due to mitral annular calcification (moderate or greater). Left ventricular diastolic function could not be evaluated. Right Ventricle: The right ventricular size is normal. No increase in right ventricular wall thickness. Right ventricular systolic function is normal. There is moderately elevated pulmonary artery systolic pressure. The tricuspid regurgitant velocity is 3.29 m/s, and with an assumed right atrial pressure of 3 mmHg, the estimated right ventricular systolic pressure is 46.3 mmHg. Left Atrium: Left atrial size was severely dilated. Right Atrium: Right atrial size was normal in size. Pericardium: There is no evidence of pericardial effusion. Mitral Valve: The mitral valve is normal in structure. Severe mitral annular calcification. Mild mitral valve regurgitation. Mild mitral valve stenosis. MV peak gradient, 13.4 mmHg. The mean mitral valve gradient is 5.0 mmHg with average heart rate of 76  bpm. Tricuspid Valve: The tricuspid valve is normal in structure. Tricuspid valve regurgitation is not  demonstrated. No evidence of  tricuspid stenosis. Aortic Valve: The aortic valve was not well visualized. Aortic valve regurgitation is not visualized. Aortic valve sclerosis/calcification is present, without any evidence of aortic stenosis. Pulmonic Valve: The pulmonic valve was not well visualized. Pulmonic valve regurgitation is not visualized. No evidence of pulmonic stenosis. Aorta: The aortic root is normal in size and structure. Venous: The inferior vena cava is normal in size with greater than 50% respiratory variability, suggesting right atrial pressure of 3 mmHg. IAS/Shunts: No atrial level shunt detected by color flow Doppler.  LEFT VENTRICLE PLAX 2D LVIDd:         4.80 cm     Diastology LVIDs:         3.20 cm     LV e' medial:    7.14 cm/s LV PW:         0.90 cm     LV E/e' medial:  23.8 LV IVS:        0.80 cm     LV e' lateral:   8.68 cm/s LVOT diam:     1.60 cm     LV E/e' lateral: 19.6 LV SV:         55 LV SV Index:   37 LVOT Area:     2.01 cm  LV Volumes (MOD) LV vol d, MOD A2C: 96.2 ml LV vol s, MOD A2C: 31.8 ml LV SV MOD A2C:     64.4 ml RIGHT VENTRICLE             IVC RV Basal diam:  3.40 cm     IVC diam: 1.80 cm RV S prime:     12.20 cm/s TAPSE (M-mode): 1.9 cm LEFT ATRIUM             Index        RIGHT ATRIUM           Index LA diam:        4.10 cm 2.77 cm/m   RA Area:     12.10 cm LA Vol (A2C):   71.9 ml 48.56 ml/m  RA Volume:   22.70 ml  15.33 ml/m LA Vol (A4C):   70.8 ml 47.82 ml/m LA Biplane Vol: 74.7 ml 50.45 ml/m  AORTIC VALVE LVOT Vmax:   137.00 cm/s LVOT Vmean:  88.700 cm/s LVOT VTI:    0.274 m  AORTA Ao Root diam: 2.60 cm MITRAL VALVE                  TRICUSPID VALVE MV Area (PHT): 4.31 cm       TR Peak grad:   43.3 mmHg MV Area VTI:   2.60 cm       TR Vmax:        329.00 cm/s MV Peak grad:  13.4 mmHg MV Mean grad:  5.0 mmHg       SHUNTS MV Vmax:       1.83 m/s       Systemic VTI:  0.27 m MV Vmean:      107.0 cm/s     Systemic Diam: 1.60 cm MV VTI:        0.21 m MV Decel Time:  176 msec MR Peak grad:    163.8 mmHg MR Vmax:         640.00 cm/s MR Vmean:        492.0 cm/s MR PISA Nyquist: 0.3 m/s MR PISA:         1.01 cm MR PISA Radius:  0.40 cm  MV E velocity: 170.00 cm/s MV A velocity: 89.50 cm/s MV E/A ratio:  1.90 Franck Azobou Tonleu Electronically signed by Joelle Ren Ny Signature Date/Time: 01/08/2024/11:03:06 AM    Final    DG Chest 2 View Result Date: 01/07/2024 EXAM: 2 VIEW(S) XRAY OF THE CHEST 01/07/2024 03:55:31 PM COMPARISON: Chest x ray 03/25/2023. CLINICAL HISTORY: weakness FINDINGS: LUNGS AND PLEURA: Lungs are hypoinflated with possible mild vascular congestion. No lobar consolidation or effusion. No pneumothorax. HEART AND MEDIASTINUM: Mild cardiomegaly. Aortic atherosclerosis. BONES AND SOFT TISSUES: Exaggerated thoracic kyphosis. Advanced degenerative changes of the right shoulder. No acute osseous abnormality. IMPRESSION: 1. Hypoinflated  lungs and possible mild vascular congestion. 2. Mild cardiomegaly with aortic atherosclerosis. Electronically signed by: Toribio Agreste MD 01/07/2024 04:03 PM EST RP Workstation: [id number]    Scheduled Meds:  apixaban   2.5 mg Oral BID   escitalopram   20 mg Oral QPM   furosemide   40 mg Intravenous BID   gabapentin   200 mg Oral TID   hydrALAZINE   100 mg Oral TID   losartan   100 mg Oral Daily   [START ON 01/09/2024] potassium chloride  20 mEq Oral BID   rosuvastatin   10 mg Oral Daily   Continuous Infusions:   LOS: 1 day   Time spent: 36 minutes  Casimer Dare, MD  [facility]  01/08/2024, 1:15 PM

## 2024-01-08 NOTE — TOC CM/SW Note (Signed)
 Transition of Care [facility]) - Inpatient Brief Assessment   Patient Details  Name: Lorraine Page MRN: [id number] Date of Birth: [birth year]  [facility] CM/SW Contact:    Lauraine FORBES Saa, LCSWA Phone Number: 01/08/2024, 9:39 AM   Clinical Narrative:  9:39 AM Per chart review, patient resides at home alone. Patient has a PCP and insurance. Patient does not have SNF history. Patient has HH history with [facility]. Patient has DME (RW, BSC, tube bench) history. Patient's preferred pharmacy is [facility]. No TOC needs identified at this time. TOC will continue to follow.  Transition of Care Asessment: Insurance and Status: Insurance coverage has been reviewed Patient has primary care physician: Yes Home environment has been reviewed: Private Residence Prior level of function:: N/A Prior/Current Home Services: No current home services Social Drivers of Health Review: SDOH reviewed no interventions necessary Readmission risk has been reviewed: Yes (Currrently Yellow 15%) Transition of care needs: no transition of care needs at this time

## 2024-01-08 NOTE — Assessment & Plan Note (Addendum)
 01/08/24 due to CHF exacerbation. PT/OT consulted.

## 2024-01-08 NOTE — Progress Notes (Signed)
 Pt seen for preadmission questions. Pt, her daughter, and her son were present. Pt denies any weapons or pets in the home. Pt's daughter would like meals provided. Pt informed we will give them an update when we have a better idea on admission time.

## 2024-01-08 NOTE — Progress Notes (Signed)
 Video call completed with patient and her daughter Donny. Introduced myself as the [employer]DR[employer] for the shift and verbalized that I would be available all night via phone call or tablet call if any needs arise. Patient could be found sitting at her dining room tablet in no distress. Daughter Donny assisted with medication administration. Patient states arm is still hurting but pain is chronic. Encouraged them to call if more pain medication would be needed later tonight. All questions answered before ending the call.

## 2024-01-08 NOTE — Assessment & Plan Note (Addendum)
 Continue hydralazine  100 mg tid, losartan  100 mg daily.

## 2024-01-08 NOTE — Assessment & Plan Note (Addendum)
Continue lexapro 10mg  daily.

## 2024-01-08 NOTE — Evaluation (Addendum)
 Occupational Therapy Evaluation Patient Details Name: Lorraine Page MRN: [id number] DOB: 1932-02-18 Today's Date: 01/08/2024   History of Present Illness   89 yo female admitted for workup CHF exacerbation PMH HTN, CKDIII, Afib, CVA, CHF, afib on eliquis , HLD, depresssion     Clinical Impressions Pt ind at baseline with ADLs and uses rollator for functional mobility, pt lives alone but her daughter comes over daily for dinner and can assist PRN. Pt with R deficits from prior CVA, overall needs min A for ADLs, CGA for bed mobility and transfers with RW. Pt presenting with impairments listed below, will follow acutely. Recommend HHOT at d/c.      If plan is discharge home, recommend the following:   A little help with walking and/or transfers;A little help with bathing/dressing/bathroom;Assistance with cooking/housework;Assist for transportation;Help with stairs or ramp for entrance     Functional Status Assessment   Patient has had a recent decline in their functional status and demonstrates the ability to make significant improvements in function in a reasonable and predictable amount of time.     Equipment Recommendations   None recommended by OT     Recommendations for Other Services   PT consult     Precautions/Restrictions   Precautions Precautions: Fall Recall of Precautions/Restrictions: Intact Restrictions Weight Bearing Restrictions Per Provider Order: No     Mobility Bed Mobility Overal bed mobility: Needs Assistance Bed Mobility: Supine to Sit     Supine to sit: Contact guard          Transfers Overall transfer level: Needs assistance Equipment used: Rolling walker (2 wheels) Transfers: Sit to/from Stand Sit to Stand: Contact guard assist                  Balance Overall balance assessment: Needs assistance Sitting-balance support: Feet supported Sitting balance-Leahy Scale: Good Sitting balance - Comments: sits unsupported  on EOB   Standing balance support: Reliant on assistive device for balance, During functional activity Standing balance-Leahy Scale: Poor Standing balance comment: reliant on external support                           ADL either performed or assessed with clinical judgement   ADL Overall ADL's : Needs assistance/impaired Eating/Feeding: Set up   Grooming: Set up   Upper Body Bathing: Minimal assistance   Lower Body Bathing: Minimal assistance   Upper Body Dressing : Minimal assistance   Lower Body Dressing: Minimal assistance   Toilet Transfer: Minimal assistance   Toileting- Clothing Manipulation and Hygiene: Minimal assistance       Functional mobility during ADLs: Minimal assistance       Vision   Additional Comments: baseline blurred vision in R eye from prior CVA, functional for BADL     Perception Perception: Not tested       Praxis Praxis: Not tested       Pertinent Vitals/Pain Pain Assessment Pain Assessment: No/denies pain     Extremity/Trunk Assessment Upper Extremity Assessment Upper Extremity Assessment: Generalized weakness;RUE deficits/detail RUE Deficits / Details: 3/5, deficits from prior CVA RUE Sensation: decreased light touch RUE Coordination: decreased fine motor;decreased gross motor   Lower Extremity Assessment Lower Extremity Assessment: Defer to PT evaluation   Cervical / Trunk Assessment Cervical / Trunk Assessment: Normal   Communication Communication Communication: No apparent difficulties   Cognition Arousal: Alert Behavior During Therapy: WFL for tasks assessed/performed Cognition: Cognition impaired  Memory impairment (select all impairments): Short-term memory                       Following commands: Intact       Cueing  General Comments   Cueing Techniques: Verbal cues  VSS on RA   Exercises     Shoulder Instructions      Home Living Family/patient expects to be  discharged to:: Private residence Living Arrangements: Alone Available Help at Discharge: Family;Available PRN/intermittently (daughter comes daily for dinner) Type of Home: House Home Access: Stairs to enter [employer] of Steps: 3 Entrance Stairs-Rails: Can reach both Home Layout: One level     Bathroom Shower/Tub: Producer, Television/film/video: Standard     Home Equipment: Agricultural Consultant (2 wheels);Rollator (4 wheels);BSC/3in1;Shower seat;Grab bars - tub/shower;Grab bars - toilet          Prior Functioning/Environment Prior Level of Function : Needs assist             Mobility Comments: rollator and furniture walking PRN, 3 falls due to LOB ADLs Comments: ind, daughter assists with meds, makes own meals    OT Problem List: Decreased strength;Decreased activity tolerance;Decreased range of motion;Impaired balance (sitting and/or standing);Impaired UE functional use   OT Treatment/Interventions: Self-care/ADL training;Therapeutic exercise;Energy conservation;DME and/or AE instruction;Therapeutic activities;Patient/family education;Balance training      OT Goals(Current goals can be found in the care plan section)   Acute Rehab OT Goals Patient Stated Goal: none stated OT Goal Formulation: With patient Time For Goal Achievement: 01/22/24 Potential to Achieve Goals: Good   OT Frequency:  Min 1X/week    Co-evaluation              AM-PAC OT 6 Clicks Daily Activity     Outcome Measure Help from another person eating meals?: None Help from another person taking care of personal grooming?: A Little Help from another person toileting, which includes using toliet, bedpan, or urinal?: A Little Help from another person bathing (including washing, rinsing, drying)?: A Little Help from another person to put on and taking off regular upper body clothing?: A Little Help from another person to put on and taking off regular lower body clothing?: A  Little 6 Click Score: 19   End of Session Equipment Utilized During Treatment: Rolling walker (2 wheels) Nurse Communication: Mobility status  Activity Tolerance: Patient tolerated treatment well Patient left: in chair;with call bell/phone within reach;with chair alarm set  OT Visit Diagnosis: Muscle weakness (generalized) (M62.81)                Time: 9160-9097 OT Time Calculation (min): 23 min Charges:  OT General Charges $OT Visit: 1 Visit OT Evaluation $OT Eval Low Complexity: 1 Low OT Treatments $Self Care/Home Management : 8-22 mins  Aniello Christopoulos K, OTD, OTR/L SecureChat Preferred Acute Rehab [phone]   Tricha Ruggirello K Koonce 01/08/2024, 11:41 AM

## 2024-01-08 NOTE — Assessment & Plan Note (Addendum)
 Continue crestor '10mg'$  daily

## 2024-01-08 NOTE — Assessment & Plan Note (Addendum)
 12/4: Continue furosemide  20 mg IV BID Strict I/Os initiated, RN aware and will place orders for HAT on toilet Per patient and daughter at bedside, patient states the swelling of her lower extremities are doing better 12/5: resolved 12/6: Furosemide  20 mg daily + one additional tablet prn for weight gain of > 5 pounds, prescribed. Patient can drink 40-60 oz of water per day. Instructions for patients to weigh herself every morning after BM and urination.

## 2024-01-08 NOTE — Evaluation (Signed)
 Physical Therapy Evaluation Patient Details Name: Lorraine Page MRN: [id number] DOB: 12-Jul-1932 Today's Date: 01/08/2024  History of Present Illness  89 yo female admitted for workup CHF exacerbation PMH HTN, CKDIII, Afib, CVA, CHF, afib on eliquis , HLD, depresssion  Clinical Impression  Pt is presenting at supervision to [facility] for sit to stand and gait with RW. Pt has all needed equipment at home. Daughter can assist as needed with steps to get into home. Pt reports 3 falls in the past 6 months from losing her balance; no overt LOB during treatment session today. Due to pt current functional status, home set up and available assistance at home recommending skilled physical therapy services 3x/week in order to address strength, balance and functional mobility to decrease risk for falls, injury and re-hospitalization.           If plan is discharge home, recommend the following: Help with stairs or ramp for entrance;Assistance with cooking/housework;Assist for transportation     Equipment Recommendations None recommended by PT     Functional Status Assessment Patient has had a recent decline in their functional status and demonstrates the ability to make significant improvements in function in a reasonable and predictable amount of time.     Precautions / Restrictions Precautions Precautions: Fall Recall of Precautions/Restrictions: Intact Restrictions Weight Bearing Restrictions Per Provider Order: No      Mobility  Bed Mobility     General bed mobility comments: up in recliner on arrival and departure.    Transfers Overall transfer level: Needs assistance Equipment used: Rolling walker (2 wheels) Transfers: Sit to/from Stand Sit to Stand: Supervision           General transfer comment: supervision for safety    Ambulation/Gait Ambulation/Gait assistance: Supervision Gait Distance (Feet): 120 Feet Assistive device: Rolling walker (2 wheels) Gait  Pattern/deviations: Step-through pattern, Decreased step length - right, Decreased step length - left, Decreased stride length Gait velocity: decrease Gait velocity interpretation: <1.31 ft/sec, indicative of household ambulator   General Gait Details: short step length with minimal heel toe gait pattern.  Stairs Stairs:  (pt was fatigued and unable to get to steps. Did not perform today. Daughter can assist with steps.)             Balance Overall balance assessment: Mild deficits observed, not formally tested Sitting-balance support: Feet supported Sitting balance-Leahy Scale: Good Sitting balance - Comments: sits unsupported on EOB   Standing balance support: During functional activity Standing balance-Leahy Scale: Fair Standing balance comment: no overt LOB           Pertinent Vitals/Pain Pain Assessment Pain Assessment: No/denies pain    Home Living Family/patient expects to be discharged to:: Private residence Living Arrangements: Alone Available Help at Discharge: Family;Available PRN/intermittently (daughter comes daily for dinner) Type of Home: House Home Access: Stairs to enter Entrance Stairs-Rails: Can reach both Entrance Stairs-Number of Steps: 3   Home Layout: One level Home Equipment: [employer]ant (2 wheels);Rollator (4 wheels);BSC/3in1;Shower seat;Grab bars - tub/shower;Grab bars - toilet      Prior Function Prior Level of Function : Needs assist             Mobility Comments: rollator and furniture walking PRN, 3 falls due to LOB ADLs Comments: ind, daughter assists with meds, makes own meals     Extremity/Trunk Assessment   Upper Extremity Assessment Upper Extremity Assessment: Defer to OT evaluation RUE Deficits / Details: 3/5, deficits from prior CVA RUE Sensation: decreased light touch RUE  Coordination: decreased fine motor;decreased gross motor    Lower Extremity Assessment Lower Extremity Assessment: Generalized weakness     Cervical / Trunk Assessment Cervical / Trunk Assessment: Normal  Communication   Communication Communication: No apparent difficulties    Cognition Arousal: Alert Behavior During Therapy: WFL for tasks assessed/performed   PT - Cognitive impairments: No apparent impairments   Following commands: Intact       Cueing Cueing Techniques: Verbal cues     General Comments General comments (skin integrity, edema, etc.): vital signs stable on room air throughout activity        Assessment/Plan    PT Assessment Patient needs continued PT services  PT Problem List Decreased strength;Decreased activity tolerance;Decreased balance;Decreased mobility       PT Treatment Interventions DME instruction;Balance training;Gait training;Stair training;Functional mobility training;Therapeutic activities;Therapeutic exercise;Patient/family education    PT Goals (Current goals can be found in the Care Plan section)  Acute Rehab PT Goals Patient Stated Goal: to return home, improve strength/balance to decrease falls PT Goal Formulation: With patient/family Time For Goal Achievement: 01/22/24 Potential to Achieve Goals: Good    Frequency Min 2X/week        AM-PAC PT 6 Clicks Mobility  Outcome Measure Help needed turning from your back to your side while in a flat bed without using bedrails?: A Little Help needed moving from lying on your back to sitting on the side of a flat bed without using bedrails?: A Little Help needed moving to and from a bed to a chair (including a wheelchair)?: A Little Help needed standing up from a chair using your arms (e.g., wheelchair or bedside chair)?: A Little Help needed to walk in hospital room?: A Little Help needed climbing 3-5 steps with a railing? : A Little 6 Click Score: 18    End of Session Equipment Utilized During Treatment: Gait belt Activity Tolerance: Patient tolerated treatment well Patient left: with call bell/phone within  reach;in chair;with family/visitor present Nurse Communication: Mobility status PT Visit Diagnosis: Unsteadiness on feet (R26.81);Muscle weakness (generalized) (M62.81)    Time: 8981-8958 PT Time Calculation (min) (ACUTE ONLY): 23 min   Charges:   PT Evaluation $PT Eval Low Complexity: 1 Low PT Treatments $Therapeutic Activity: 8-22 mins PT General Charges $$ ACUTE PT VISIT: 1 Visit         [name] Maier, DPT, CLT  Acute Rehabilitation Services Office: [phone] (Secure chat preferred)   [name] VEAR Maier 01/08/2024, 1:42 PM

## 2024-01-08 NOTE — Assessment & Plan Note (Addendum)
 Continue Eliquis  2.5 mg bid. Not on betablocker due to bradycardia in the past while on beta-blockers.

## 2024-01-08 NOTE — Assessment & Plan Note (Addendum)
 01/08/24 iron studies do not show iron deficiency. No B12 or folate deficiency either. Last CBC from 2023. Will need outpatient f/u.  Anemia Workup Lab Results  Component Value Date/Time   VITAMINB12 380 01/08/2024 05:41 AM   FOLATE >20.0 01/08/2024 05:41 AM   FERRITIN 24 01/08/2024 05:41 AM   TIBC 318 01/08/2024 05:41 AM   IRON 54 01/08/2024 05:41 AM   RETICCTPCT 1.2 01/08/2024 05:41 AM  12/4: hgb on 12/3 reviewed, hgb is 9.9

## 2024-01-08 NOTE — Progress Notes (Signed)
 Heart Failure Navigator Progress Note  Assessed for Heart & Vascular TOC clinic readiness.  Patient with a EF 60-65%, .Patient transferring to hospital at home program. Has a scheduled [facility] appointment with Dr. Pietro on  03/25/2024.   Navigator will sign off at this time.   Stephane Haddock, BSN, Scientist, Clinical (histocompatibility And Immunogenetics) Only

## 2024-01-08 NOTE — Progress Notes (Signed)
 Patient transferred to the [facility] program. Communicated with patient via video tablet. AAOx3 but forgetful. Plan of care reviewed with patient's daughter, [name]. Patient was told that the virtual nurse is available 24/7. [facility] phone number given to patient. Tablet usage explained. Medication reconciliation and skin check completed with Lauraine, paramedic. Patient and family agreeable with plan of care.

## 2024-01-08 NOTE — Assessment & Plan Note (Addendum)
 Continue outpatient followup with pulmonologist and cardiologist as appropriate

## 2024-01-08 NOTE — Plan of Care (Signed)
  Problem: Education: Goal: Knowledge of General Education information will improve Description: Including pain rating scale, medication(s)/side effects and non-pharmacologic comfort measures Outcome: Progressing   Problem: Health Behavior/Discharge Planning: Goal: Ability to manage health-related needs will improve Outcome: Progressing   Problem: Clinical Measurements: Goal: Ability to maintain clinical measurements within normal limits will improve Outcome: Progressing   Problem: Activity: Goal: Risk for activity intolerance will decrease Outcome: Progressing   Problem: Nutrition: Goal: Adequate nutrition will be maintained Outcome: Progressing   Problem: Pain Managment: Goal: General experience of comfort will improve and/or be controlled Outcome: Progressing   Problem: Safety: Goal: Ability to remain free from injury will improve Outcome: Progressing   Problem: Skin Integrity: Goal: Risk for impaired skin integrity will decrease Outcome: Progressing   Problem: Education: Goal: Ability to verbalize understanding of medication therapies will improve Outcome: Progressing

## 2024-01-08 NOTE — Progress Notes (Signed)
 Pt admitted to [facility] program. Pt transported by Gaylan, EMT via wheelchair fleeta. Pt's daughter present in home.  Home safety check completed. Pt's home is small but clean with clear walkways. Photos sent to teams of living areas. Pt's bathroom has a walk in shower with chair and grab bar. Pt's toilet has a lift seat in place. Pt's daughter will help Pt with bathing but will probably avoid using the shower due to Pt's weakness.   Med rec and 2 person skin assessment completed with Shanda, RN.   Vital signs and assessment obtained as noted. Pt has some edema noted to lower legs above sock line. Pt had an episode of incontinence and I assisted her to the bathroom and cleaned her with wipes and helped her change. Erythema noted to Pt's gluteal cleft. Erythema is blanchable. RN notified.   Medications administered as noted. IV care completed.   Pt and daughter shown how to use tablet to make and receive calls from virtual RN. I went over the welcome binder as well and made sure Pt's daughter has the [facility] hub phone number. Pt and daughter encouraged to call with any problems, 24 hours a day.

## 2024-01-09 ENCOUNTER — Encounter (HOSPITAL_COMMUNITY): Payer: Self-pay | Admitting: Internal Medicine

## 2024-01-09 DIAGNOSIS — N179 Acute kidney failure, unspecified: Secondary | ICD-10-CM

## 2024-01-09 LAB — BASIC METABOLIC PANEL WITH GFR
Anion gap: 9 (ref 5–15)
BUN: 30 mg/dL — ABNORMAL HIGH (ref 8–23)
CO2: 25 mmol/L (ref 22–32)
Calcium: 8.6 mg/dL — ABNORMAL LOW (ref 8.9–10.3)
Chloride: 101 mmol/L (ref 98–111)
Creatinine, Ser: 1.89 mg/dL — ABNORMAL HIGH (ref 0.44–1.00)
GFR, Estimated: 25 mL/min — ABNORMAL LOW (ref >60)
Glucose, Bld: 99 mg/dL (ref 70–99)
Potassium: 4.7 mmol/L (ref 3.5–5.1)
Sodium: 135 mmol/L (ref 135–145)

## 2024-01-09 LAB — MAGNESIUM: Magnesium: 1.9 mg/dL (ref 1.7–2.4)

## 2024-01-09 MED ORDER — BACID PO TABS
2.0000 | ORAL_TABLET | Freq: Every day | ORAL | Status: DC
  Filled 2024-01-09: qty 2

## 2024-01-09 MED ORDER — SODIUM CHLORIDE 0.9 % IV BOLUS: 250.0000 mL | Freq: Once | INTRAVENOUS | Status: DC

## 2024-01-09 MED ORDER — RISAQUAD PO CAPS
1.0000 | ORAL_CAPSULE | Freq: Every day | ORAL | Status: DC
  Administered 2024-01-09 – 2024-01-10 (×2): 1 via ORAL
  Filled 2024-01-09 (×5): qty 1

## 2024-01-09 MED ORDER — SODIUM CHLORIDE 0.9 % IV BOLUS
500.0000 mL | Freq: Once | INTRAVENOUS | Status: AC
  Administered 2024-01-09: 500 mL via INTRAVENOUS

## 2024-01-09 NOTE — Assessment & Plan Note (Addendum)
 Addendum at 1300: sCr noted to be elevated to 1.89/eGFR 25 from 1.31/39 yesterday. Stop evening lasix  20 mg IV. NS 500 ml bolus scheduled for 1700. Recheck BMP in the AM. Home losartan  will be discontinued for 12/5  12/5: recheck BMP today. Pending renal function and electrolytes, lasix  20 mg IV once in the evening or IV fluid bolus will be given.   12/6: sCr/eGFR: 1.09/49. Acute kidney injury has resolved. Lasix  20 mg daily resumed

## 2024-01-09 NOTE — Progress Notes (Signed)
 Arrived to patient    she is alert and oriented   she states she feels fine   daughter states the lasix  is really working   she states she can't make it to the bathroom most times   patients legs look really good   daughter states that they look more like normal    patient had received most meds by virtual nurse prior to my arrival   she was given the lasix  IV   IV looks good   no complications with IV   blood work was pulled for CMP and Magnesium  patient has no complaints  she is getting around with her walker or by handling objects as walking   vitals are stable

## 2024-01-09 NOTE — Plan of Care (Signed)

## 2024-01-09 NOTE — Progress Notes (Addendum)
 PROGRESS NOTE - Virtual Telemedicine  [name]  FMW:[id number] DOB: July 14, 1932 DOA: 01/07/2024 PCP: Daryl Setter, NP   CC: weakness, pain in legs HPI: Lorraine Page is a 89 y.o. female with medical history significant for chronic diastolic heart failure, Pfib, chronically anticoagulated on Eliquis , essential hypertension, hyperlipidemia, CKD 3B with baseline creatinine 1.3-1.7, anemia of chronic disease with baseline hemoglobin 11-12, who is admitted to [facility] on 01/07/2024 by way of transfer from [facility] with acute on chronic diastolic heart failure after presenting from home to the latter facility complaining of shortness of breath.  She has a history of chronic diastolic heart failure, with most recent echocardiogram performed in November 2020, which was notable for LVEF 55 to 60%, grade 3 diastolic dysfunction, mildly reduced right ventricular systolic function, mildly dilated bilateral atria, moderate mitral digitation as well as moderate tricuspid regurgitation.  Her cardiac history is also notable for paroxysmal atrial fibrillation for which she is chronically anticoagulated on Eliquis .  Not on any AV nodal blocking agents at home.  12/2: pt presented to ED for weaknes 12/2: pt admitted to [facility] for [facility] diastolic heart failure 12/3: transferred to H@H  service   12/4: I assumed care of the patient. Strict I/Os initiated.  Addendum at 1300: sCr noted to be elevated to 1.89/eGFR 25 from 1.31/39 yesterday. Stop evening lasix  20 mg IV. NS 500 ml bolus scheduled for 1700. Recheck BMP in the AM.  Assessment & Plan:   Principal Problem:   Acute on chronic diastolic congestive heart failure (HCC) Active Problems:   AKI (acute kidney injury)   SOB (shortness of breath)   Generalized weakness   Acute on chronic anemia   Paroxysmal atrial fibrillation (HCC)   Chronic anticoagulation - on eliquis  for PAF   CKD stage 3b, GFR  30-44 ml/min (HCC) - baseline Scr 1.35-1.65   Essential (primary) hypertension   Pulmonary hypertension, moderate to severe (HCC)   HLD (hyperlipidemia)   Recurrent major depression in partial remission   Elevated troponin   Assessment and Plan:  * Acute on chronic diastolic congestive heart failure (HCC) 12/4: Continue furosemide  20 mg IV BID Strict I/Os initiated, RN aware and will place orders for HAT on toilet Per patient and daughter at bedside, patient states the swelling of her lower extremities are doing better  AKI (acute kidney injury) Addendum at 1300: sCr noted to be elevated to 1.89/eGFR 25 from 1.31/39 yesterday. Stop evening lasix  20 mg IV. NS 500 ml bolus scheduled for 1700. Recheck BMP in the AM. Home losartan  will be discontinued for 12/5  Acute on chronic anemia 01/08/24 iron studies do not show iron deficiency. No B12 or folate deficiency either. Last CBC from 2023. Will need outpatient f/u.  Anemia Workup Lab Results  Component Value Date/Time   VITAMINB12 380 01/08/2024 05:41 AM   FOLATE >20.0 01/08/2024 05:41 AM   FERRITIN 24 01/08/2024 05:41 AM   TIBC 318 01/08/2024 05:41 AM   IRON 54 01/08/2024 05:41 AM   RETICCTPCT 1.2 01/08/2024 05:41 AM  12/4: hgb on 12/3 reviewed, hgb is 9.9  Generalized weakness 01/08/24 due to CHF exacerbation.  12/3: PT evaluated patient and recommends PT min 2x/week  SOB (shortness of breath) Secondary to CHF exacerbation, currently on RA  Elevated troponin Troponins: 44 and then 42; Likely due demand ischemia from CHF. No other Troponin values in Epic to compare. Low clinical suspicion for ACS given no chest pain and now no  shortness of breath and EKG on 12/2 reviewed with negative for ischemic changes  Recurrent major depression in partial remission Continue lexapro  10 mg daily  HLD (hyperlipidemia) Continue crestor  10 mg daily.  Pulmonary hypertension, moderate to severe [facility]) Continue outpatient followup with  pulmonologist and cardiologist as appropriate  Essential (primary) hypertension Continue hydralazine  100 mg tid, losartan  100 mg daily.  CKD stage 3b, GFR 30-44 ml/min (HCC) - baseline Scr 1.35-1.65 Baseline Scr 1.35-1.65. monitor Scr while on losartan  and IV lasix . Check BMP today and monitor renal function and electrolytes  Chronic anticoagulation - on eliquis  for PAF Remains on Eliquis  2.5 mg bid.  Paroxysmal atrial fibrillation (HCC) Continue Eliquis  2.5 mg bid. Not on betablocker due to bradycardia in the past while on beta-blockers.  DVT prophylaxis: Apixaban  2.5 mg p.o. twice daily Code Status: Full code Family Communication: Updated daughter, Sheneika Walstad at bedside Disposition Plan: Pending clinical course, anticipate discharge on Saturday, 12/6 Level of care: Hospital at Home Med-Surg  Consultants:  PT, OT  Procedures:  None indicated at this time  Antimicrobials: None indicated   Subjective:  At bedside, patient was able to tell me her first and last name, her age, location, current calendar year.  She does not appear to be in acute distress.  Her daughter, Shamell Hittle was at bedside.  She reports no changes to oral p.o. intake including water.  She does drink about half a glass of water with each time that she takes her medications.  Currently, she denies chest pain, sob, abdominal pain, nausea, vomiting, vision changes, dysuria, hematuria, changes to her bowel movements, blood in her stool, syncope.  She reports that she did note that on 01/07/2024, she did have increased swelling around her legs, ankles and about a 3 to 4 pound weight gain that morning.  However she fell and became fatigued, prompting her to present to the emergency department where she was admitted for heart failure exacerbation.  Objective: Vitals:   01/08/24 1600 01/08/24 1700 01/09/24 1042 01/09/24 1428  BP: (!) 154/80  115/71 113/66  Pulse: 80  80 82  Resp: 20  16   Temp: 98.7 F (37.1  C)  98.1 F (36.7 C)   TempSrc: Oral  Oral   SpO2: 96% 95% 96%   Weight: 50.8 kg  51.6 kg   Height:        Intake/Output Summary (Last 24 hours) at 01/09/2024 1605 Last data filed at 01/09/2024 1400 Gross per 24 hour  Intake 650 ml  Output --  Net 650 ml   Filed Weights   01/08/24 0208 01/08/24 1600 01/09/24 1042  Weight: 52.6 kg 50.8 kg 51.6 kg   Examination was performed with the assistant of medic, [employer]:  General exam: Appears calm and comfortable  Respiratory system: Clear to auscultation. Respiratory effort normal. Cardiovascular system: S1 & S2 heard, RRR. No JVD, murmurs, rubs, gallops or clicks. No pedal edema. Gastrointestinal system: Abdomen is nondistended, soft and nontender. No organomegaly or masses felt. Normal bowel sounds heard. Central nervous system: Alert and oriented. No focal neurological deficits. Skin: No rashes, lesions that were visible Psychiatry: Judgement and insight appear normal. Mood & affect appropriate.   Data Reviewed: I have personally reviewed following labs and imaging studies  CBC: Recent Labs  Lab 01/07/24 1516 01/08/24 0541  WBC 3.9* 3.5*  NEUTROABS 2.3 1.9  HGB 9.8* 9.9*  HCT 29.9* 29.4*  MCV 96.8 95.8  PLT 226 221   Basic Metabolic Panel: Recent Labs  Lab 01/07/24 1516 01/08/24 0541 01/09/24 1146  NA 135 137 135  K 4.4 3.7 4.7  CL 99 97* 101  CO2 22 27 25   GLUCOSE 115* 102* 99  BUN 23 18 30*  CREATININE 1.55* 1.31* 1.89*  CALCIUM  9.0 8.9 8.6*  MG  --  2.0 1.9  PHOS  --  4.3  --    GFR: Estimated Creatinine Clearance: 14.5 mL/min (A) (by C-G formula based on SCr of 1.89 mg/dL (H)).  Liver Function Tests: Recent Labs  Lab 01/07/24 1516 01/08/24 0541  AST 34 28  ALT 15 16  ALKPHOS 53 42  BILITOT 0.3 0.7  PROT 7.3 6.2*  ALBUMIN  4.5 3.5   Recent Labs  Lab 01/07/24 1516  LIPASE 67*   Coagulation Profile: Recent Labs  Lab 01/08/24 0541  INR 1.2   BNP (last 3 results) Recent Labs     01/07/24 1516  PROBNP 2,335.0*   Thyroid  Function Tests: Recent Labs    01/08/24 0541  TSH 8.223*   Anemia Panel: Recent Labs    01/08/24 0541  VITAMINB12 380  FOLATE >20.0  FERRITIN 24  TIBC 318  IRON 54  RETICCTPCT 1.2   Sepsis Labs: Recent Labs  Lab 01/07/24 1516 01/07/24 1717 01/08/24 0541  PROCALCITON  --   --  <0.10  LATICACIDVEN 1.4 1.0  --    Recent Results (from the past 240 hours)  Resp panel by RT-PCR (RSV, Flu A&B, Covid) Anterior Nasal Swab     Status: None   Collection Time: 01/07/24  3:16 PM   Specimen: Anterior Nasal Swab  Result Value Ref Range Status   SARS Coronavirus 2 by RT PCR NEGATIVE NEGATIVE Final    Comment: (NOTE) SARS-CoV-2 target nucleic acids are NOT DETECTED.  The SARS-CoV-2 RNA is generally detectable in upper respiratory specimens during the acute phase of infection. The lowest concentration of SARS-CoV-2 viral copies this assay can detect is 138 copies/mL. A negative result does not preclude SARS-Cov-2 infection and should not be used as the sole basis for treatment or other patient management decisions. A negative result may occur with  improper specimen collection/handling, submission of specimen other than nasopharyngeal swab, presence of viral mutation(s) within the areas targeted by this assay, and inadequate number of viral copies(<138 copies/mL). A negative result must be combined with clinical observations, patient history, and epidemiological information. The expected result is Negative.  Fact Sheet for Patients:  bloggercourse.com  Fact Sheet for Healthcare Providers:  seriousbroker.it  This test is no t yet approved or cleared by the [place]  FDA and  has been authorized for detection and/or diagnosis of SARS-CoV-2 by FDA under an Emergency Use Authorization (EUA). This EUA will remain  in effect (meaning this test can be used) for the duration of  the COVID-19 declaration under Section 564(b)(1) of the Act, 21 U.S.C.section 360bbb-3(b)(1), unless the authorization is terminated  or revoked sooner.       Influenza A by PCR NEGATIVE NEGATIVE Final   Influenza B by PCR NEGATIVE NEGATIVE Final    Comment: (NOTE) The Xpert Xpress SARS-CoV-2/FLU/RSV plus assay is intended as an aid in the diagnosis of influenza from Nasopharyngeal swab specimens and should not be used as a sole basis for treatment. Nasal washings and aspirates are unacceptable for Xpert Xpress SARS-CoV-2/FLU/RSV testing.  Fact Sheet for Patients: bloggercourse.com  Fact Sheet for Healthcare Providers: seriousbroker.it  This test is not yet approved or cleared by the [place]  FDA and has been authorized  for detection and/or diagnosis of SARS-CoV-2 by FDA under an Emergency Use Authorization (EUA). This EUA will remain in effect (meaning this test can be used) for the duration of the COVID-19 declaration under Section 564(b)(1) of the Act, 21 U.S.C. section 360bbb-3(b)(1), unless the authorization is terminated or revoked.     Resp Syncytial Virus by PCR NEGATIVE NEGATIVE Final    Comment: (NOTE) Fact Sheet for Patients: bloggercourse.com  Fact Sheet for Healthcare Providers: seriousbroker.it  This test is not yet approved or cleared by the [place]  FDA and has been authorized for detection and/or diagnosis of SARS-CoV-2 by FDA under an Emergency Use Authorization (EUA). This EUA will remain in effect (meaning this test can be used) for the duration of the COVID-19 declaration under Section 564(b)(1) of the Act, 21 U.S.C. section 360bbb-3(b)(1), unless the authorization is terminated or revoked.  Performed at [facility], [address] [place], [place] [address]   MRSA Next Gen by PCR, Nasal     Status: None   Collection Time:  01/07/24 10:54 PM   Specimen: Nasal Mucosa; Nasal Swab  Result Value Ref Range Status   MRSA by PCR Next Gen NOT DETECTED NOT DETECTED Final    Comment: (NOTE) The GeneXpert MRSA Assay (FDA approved for NASAL specimens only), is one component of a comprehensive MRSA colonization surveillance program. It is not intended to diagnose MRSA infection nor to guide or monitor treatment for MRSA infections. Test performance is not FDA approved in patients less than 83 years old. Performed at [facility], [address], [place], [place] [address]     Radiology Studies: ECHOCARDIOGRAM COMPLETE Result Date: 01/08/2024    ECHOCARDIOGRAM REPORT   Patient Name:   MAKAILYN MCCORMICK Inboden Date of Exam: 01/08/2024 Medical Rec #:  [id number]        Height:       60.0 in Accession #:    [id number]       Weight:       116.0 lb Date of Birth:  Dec 03, 1932       BSA:          1.481 m Patient Age:    89 years         BP:           168/65 mmHg Patient Gender: F                HR:           75 bpm. Exam Location:  Inpatient Procedure: 2D Echo, Cardiac Doppler and Color Doppler (Both Spectral and Color            Flow Doppler were utilized during procedure). Indications:    CHF-Acute Diastolic I50.31  History:        Patient has prior history of Echocardiogram examinations, most                 recent 12/23/2018. CKD, CVA, Signs/Symptoms:Shortness of Breath;                 Risk Factors:Dyslipidemia, Hypertension and Eleavted Troponin,                 Pre-diabetes.  Sonographer:    Koleen Popper RDCS Referring Phys: [id number] EVA NOVAK PORE  Sonographer Comments: Image acquisition challenging due to patient body habitus. IMPRESSIONS  1. Left ventricular ejection fraction, by estimation, is 60 to 65%. The left ventricle has normal function. The left ventricle has no regional wall  motion abnormalities. Left ventricular diastolic function could not be evaluated.  2. Right ventricular systolic function is normal. The right  ventricular size is normal. There is moderately elevated pulmonary artery systolic pressure. The estimated right ventricular systolic pressure is 46.3 mmHg.  3. Left atrial size was severely dilated.  4. The mitral valve is normal in structure. Mild mitral valve regurgitation. Mild mitral stenosis. Severe mitral annular calcification.  5. The aortic valve was not well visualized. Aortic valve regurgitation is not visualized. Aortic valve sclerosis/calcification is present, without any evidence of aortic stenosis.  6. The inferior vena cava is normal in size with greater than 50% respiratory variability, suggesting right atrial pressure of 3 mmHg. Comparison(s): Changes from prior study are noted. No evidence of TR and RV size/function has normalized. FINDINGS  Left Ventricle: Left ventricular ejection fraction, by estimation, is 60 to 65%. The left ventricle has normal function. The left ventricle has no regional wall motion abnormalities. The left ventricular internal cavity size was normal in size. There is  no left ventricular hypertrophy. Left ventricular diastolic function could not be evaluated due to mitral annular calcification (moderate or greater). Left ventricular diastolic function could not be evaluated. Right Ventricle: The right ventricular size is normal. No increase in right ventricular wall thickness. Right ventricular systolic function is normal. There is moderately elevated pulmonary artery systolic pressure. The tricuspid regurgitant velocity is 3.29 m/s, and with an assumed right atrial pressure of 3 mmHg, the estimated right ventricular systolic pressure is 46.3 mmHg. Left Atrium: Left atrial size was severely dilated. Right Atrium: Right atrial size was normal in size. Pericardium: There is no evidence of pericardial effusion. Mitral Valve: The mitral valve is normal in structure. Severe mitral annular calcification. Mild mitral valve regurgitation. Mild mitral valve stenosis. MV peak  gradient, 13.4 mmHg. The mean mitral valve gradient is 5.0 mmHg with average heart rate of 76  bpm. Tricuspid Valve: The tricuspid valve is normal in structure. Tricuspid valve regurgitation is not demonstrated. No evidence of tricuspid stenosis. Aortic Valve: The aortic valve was not well visualized. Aortic valve regurgitation is not visualized. Aortic valve sclerosis/calcification is present, without any evidence of aortic stenosis. Pulmonic Valve: The pulmonic valve was not well visualized. Pulmonic valve regurgitation is not visualized. No evidence of pulmonic stenosis. Aorta: The aortic root is normal in size and structure. Venous: The inferior vena cava is normal in size with greater than 50% respiratory variability, suggesting right atrial pressure of 3 mmHg. IAS/Shunts: No atrial level shunt detected by color flow Doppler.  LEFT VENTRICLE PLAX 2D LVIDd:         4.80 cm     Diastology LVIDs:         3.20 cm     LV e' medial:    7.14 cm/s LV PW:         0.90 cm     LV E/e' medial:  23.8 LV IVS:        0.80 cm     LV e' lateral:   8.68 cm/s LVOT diam:     1.60 cm     LV E/e' lateral: 19.6 LV SV:         55 LV SV Index:   37 LVOT Area:     2.01 cm  LV Volumes (MOD) LV vol d, MOD A2C: 96.2 ml LV vol s, MOD A2C: 31.8 ml LV SV MOD A2C:     64.4 ml RIGHT VENTRICLE  IVC RV Basal diam:  3.40 cm     IVC diam: 1.80 cm RV S prime:     12.20 cm/s TAPSE (M-mode): 1.9 cm LEFT ATRIUM             Index        RIGHT ATRIUM           Index LA diam:        4.10 cm 2.77 cm/m   RA Area:     12.10 cm LA Vol (A2C):   71.9 ml 48.56 ml/m  RA Volume:   22.70 ml  15.33 ml/m LA Vol (A4C):   70.8 ml 47.82 ml/m LA Biplane Vol: 74.7 ml 50.45 ml/m  AORTIC VALVE LVOT Vmax:   137.00 cm/s LVOT Vmean:  88.700 cm/s LVOT VTI:    0.274 m  AORTA Ao Root diam: 2.60 cm MITRAL VALVE                  TRICUSPID VALVE MV Area (PHT): 4.31 cm       TR Peak grad:   43.3 mmHg MV Area VTI:   2.60 cm       TR Vmax:        329.00 cm/s MV Peak  grad:  13.4 mmHg MV Mean grad:  5.0 mmHg       SHUNTS MV Vmax:       1.83 m/s       Systemic VTI:  0.27 m MV Vmean:      107.0 cm/s     Systemic Diam: 1.60 cm MV VTI:        0.21 m MV Decel Time: 176 msec MR Peak grad:    163.8 mmHg MR Vmax:         640.00 cm/s MR Vmean:        492.0 cm/s MR PISA Nyquist: 0.3 m/s MR PISA:         1.01 cm MR PISA Radius:  0.40 cm MV E velocity: 170.00 cm/s MV A velocity: 89.50 cm/s MV E/A ratio:  1.90 Franck Azobou Tonleu Electronically signed by Joelle Cedars Tonleu Signature Date/Time: 01/08/2024/11:03:06 AM    Final    Scheduled Meds:  acidophilus  1 capsule Oral Q supper   apixaban   2.5 mg Oral BID   escitalopram   20 mg Oral QPM   gabapentin   200 mg Oral TID   hydrALAZINE   100 mg Oral TID   potassium chloride  20 mEq Oral BID   [START ON 01/10/2024] rosuvastatin   10 mg Oral Once per day on Tuesday Friday    LOS: 2 days   Time spent: 50 minutes  Dr. Sherre Location: [place]  [facility] If 7PM-7AM, please contact night-coverage 01/09/2024, 4:05 PM

## 2024-01-09 NOTE — Progress Notes (Signed)
 This EMT went to room 2C05 to pick up pt for [facility] program. Upon arrival into the room the pt was found sitting upright in a recliner chair. The pt was awake and alert at this time. The RN was made aware that the pt was being taken from the room. Upon standing the pt up to transfer into the [facility] it was noted that the pt was soiled. The RN was made aware. This EMT assisted the RN in cleaning and changing the pt. New paper scrubs were placed on the pt. The pt was then assisted into the WC. The RN stated that the pt has been incontinent for the duration of her hospital stay. This EMT then wheeled the pt outside to the [facility]. While walking this EMT asked the pt what her name and DOB was. The pt was able to tell me her name but was unable to state her birthday. The pt seemed very confused and kept repeating questions multiple times after this EMT had answered the questions already asked. This EMT let the MD know via teams. LESLI Faes and MD Laurence came to the ED to assess the pt. Laurence denied any need to hold the pt. The pt was then taken out to the [place]. The pt was then loaded into the [place] with no incident to report. The pt was then transported to her home. No incidents to report. Upon arrival to the home the pt's daughter was not present at this time. Virtual RN McWhite was made aware at this time. The pt's daughter arrived within 15 minutes. The pt was then removed from the [facility] van. The pt was then assisted in ambulating into the home and up 4 stairs. The pt then ambulated to her kitchen table. The [facility] equipment was then unloaded from the [place] and taken inside. Equipment was then set up in the dining room. All equipment was explained to the pt and her daughter. The pt seemed confused about how the equipment worked. The pt continued to ask questions multiple times after they were answered. The pt's daughter verbalized understandings on how to use and trouble shoot [facility] equipment. Virtual RN McWhite was called and  it was verified that pt data was being received via current health. All other vital signs were obtained on the pt and documented in the flowchart. EMTP Hassell and Troxler then finished the rest of the admission.

## 2024-01-09 NOTE — Plan of Care (Signed)
  Problem: Education: Goal: Knowledge of General Education information will improve Description: Including pain rating scale, medication(s)/side effects and non-pharmacologic comfort measures Outcome: Progressing   Problem: Health Behavior/Discharge Planning: Goal: Ability to manage health-related needs will improve Outcome: Progressing   Problem: Clinical Measurements: Goal: Ability to maintain clinical measurements within normal limits will improve Outcome: Progressing Goal: Respiratory complications will improve Outcome: Progressing Goal: Cardiovascular complication will be avoided Outcome: Progressing   Problem: Activity: Goal: Risk for activity intolerance will decrease Outcome: Progressing   Problem: Nutrition: Goal: Adequate nutrition will be maintained Outcome: Progressing   Problem: Elimination: Goal: Will not experience complications related to bowel motility Outcome: Progressing Goal: Will not experience complications related to urinary retention Outcome: Progressing   Problem: Pain Managment: Goal: General experience of comfort will improve and/or be controlled Outcome: Progressing   Problem: Safety: Goal: Ability to remain free from injury will improve Outcome: Progressing   Problem: Skin Integrity: Goal: Risk for impaired skin integrity will decrease Outcome: Progressing   Problem: Education: Goal: Ability to demonstrate management of disease process will improve Outcome: Progressing

## 2024-01-09 NOTE — Progress Notes (Signed)
 Communicated with the patient via video tablet. Pt requested tylenol  for chronic pain. No acute distress noted. Pt denies headache, dizziness, and SOB. Plan of care reviewed with patient and daughter.

## 2024-01-09 NOTE — Progress Notes (Signed)
 Arrived to patient and daughter   patient is alert and oriented   patient states she still feels good but about ready for bed   patient has had 2-3 bathroom visits today without incident   losartan  and lasix  was removed from home bins due to being cancelled by doctor   IV fluid was started   IV flushed and pulled back great   patient did state it was a little itchy   she was told by virtual RN if it became worse to let them know and that we could get her something for it   evening meds were given   patients vitals were stable   patient was informed that she could be discharged in the next day or two   IMM was signed and dated with time   patient and daughter are told to call in for her bedtime meds in the next hour or so   they are advised if they need anything to call in

## 2024-01-09 NOTE — Progress Notes (Signed)
 Phone call completed with patient's daughter Donny. Introduced myself as the [employer] for the shift and verbalized that I am available via phone call or tablet call if any needs arise. Daughter states that they don't have any needs at this time. Plan made for medication administration in about an hour. Donny will call me from the tablet.

## 2024-01-10 DIAGNOSIS — Z9181 History of falling: Secondary | ICD-10-CM

## 2024-01-10 DIAGNOSIS — M7989 Other specified soft tissue disorders: Secondary | ICD-10-CM

## 2024-01-10 LAB — BASIC METABOLIC PANEL WITH GFR
Anion gap: 7 (ref 5–15)
BUN: 31 mg/dL — ABNORMAL HIGH (ref 8–23)
CO2: 24 mmol/L (ref 22–32)
Calcium: 8.7 mg/dL — ABNORMAL LOW (ref 8.9–10.3)
Chloride: 104 mmol/L (ref 98–111)
Creatinine, Ser: 1.56 mg/dL — ABNORMAL HIGH (ref 0.44–1.00)
GFR, Estimated: 32 mL/min — ABNORMAL LOW (ref >60)
Glucose, Bld: 95 mg/dL (ref 70–99)
Potassium: 5 mmol/L (ref 3.5–5.1)
Sodium: 135 mmol/L (ref 135–145)

## 2024-01-10 LAB — CREATININE, SERUM
Creatinine, Ser: 1.09 mg/dL — ABNORMAL HIGH (ref 0.44–1.00)
GFR, Estimated: 49 mL/min — ABNORMAL LOW (ref >60)

## 2024-01-10 MED ORDER — GABAPENTIN 100 MG PO CAPS
200.0000 mg | ORAL_CAPSULE | Freq: Every day | ORAL | Status: DC
  Administered 2024-01-10: 200 mg via ORAL
  Filled 2024-01-10 (×2): qty 2

## 2024-01-10 MED ORDER — GABAPENTIN 100 MG PO CAPS
100.0000 mg | ORAL_CAPSULE | Freq: Two times a day (BID) | ORAL | Status: DC
  Administered 2024-01-10 – 2024-01-11 (×3): 100 mg via ORAL
  Filled 2024-01-10 (×6): qty 1

## 2024-01-10 MED ORDER — POTASSIUM CHLORIDE CRYS ER 20 MEQ PO TBCR
20.0000 meq | EXTENDED_RELEASE_TABLET | Freq: Every day | ORAL | Status: DC
  Filled 2024-01-10 (×2): qty 1

## 2024-01-10 NOTE — Assessment & Plan Note (Signed)
 Increased weakness and balance difficulty the day of ED presentation Difficulty with ambulation due to balance problems Home health PT, RN, aide placed [facility] consulted

## 2024-01-10 NOTE — Plan of Care (Signed)

## 2024-01-10 NOTE — Progress Notes (Signed)
 Pt's daughter called in via ipad for administration of am meds. Pt alert and oriented, sitting in a chair. Pt able to swallow am meds. States she slept well. Pt did have one void overnight 300 cc and some incontinence  on the way to the bathroom. Pt updated on timing of paramedic am visit.

## 2024-01-10 NOTE — Progress Notes (Addendum)
 [facility] HOME PROGRESS NOTE - Telemedicine  ALEXI DORMINEY  FMW:[id number] DOB: [birth year] DOA: 01/07/2024 PCP: Daryl Setter, NP   CC: weakness, pain in legs HPI: DEAJA Page is a 89 y.o. female with medical history significant for chronic diastolic heart failure, Pfib, chronically anticoagulated on Eliquis , essential hypertension, hyperlipidemia, CKD 3B with baseline creatinine 1.3-1.7, anemia of chronic disease with baseline hemoglobin 11-12, who is admitted to [facility] on 01/07/2024 by way of transfer from [facility] with acute on chronic diastolic heart failure after presenting from home to the latter facility complaining of shortness of breath.  She has a history of chronic diastolic heart failure, with most recent echocardiogram performed in November 2020, which was notable for LVEF 55 to 60%, grade 3 diastolic dysfunction, mildly reduced right ventricular systolic function, mildly dilated bilateral atria, moderate mitral digitation as well as moderate tricuspid regurgitation.  Her cardiac history is also notable for paroxysmal atrial fibrillation for which she is chronically anticoagulated on Eliquis .  Not on any AV nodal blocking agents at home.  12/2: pt presented to ED for weaknes 12/2: pt admitted to [facility] for [facility] diastolic heart failure 12/3: transferred to H@H  service   12/4: I assumed care of the patient. Strict I/Os initiated.  Addendum at 1300: sCr noted to be elevated to 1.89/eGFR 25 from 1.31/39 yesterday. Stop evening lasix  20 mg IV. NS 500 ml bolus scheduled for 1700. Recheck BMP in the AM.  12/5: We will recheck BMP today and evaluate patient's renal function. If indicated we will give her fluids this evening or administer an additional dose of lasix  in the evening medic visit.  Pt has planned virtual visit with PT today at approximately 3p.   HH PT, RN, and nurse aide placed for patient for discharge. TOC  consulted.  Assessment & Plan:   Principal Problem:   Acute on chronic diastolic congestive heart failure (HCC) Active Problems:   AKI (acute kidney injury)   SOB (shortness of breath)   Generalized weakness   Acute on chronic anemia   Paroxysmal atrial fibrillation (HCC)   Chronic anticoagulation - on eliquis  for PAF   CKD stage 3b, GFR 30-44 ml/min (HCC) - baseline Scr 1.35-1.65   Essential (primary) hypertension   Pulmonary hypertension, moderate to severe (HCC)   HLD (hyperlipidemia)   Recurrent major depression in partial remission   Elevated troponin   Swelling of lower leg   At risk for falling   Assessment and Plan:  * Acute on chronic diastolic congestive heart failure (HCC) 12/4: Continue furosemide  20 mg IV BID Strict I/Os initiated, RN aware and will place orders for HAT on toilet Per patient and daughter at bedside, patient states the swelling of her lower extremities are doing better  AKI (acute kidney injury) Addendum at 1300: sCr noted to be elevated to 1.89/eGFR 25 from 1.31/39 yesterday. Stop evening lasix  20 mg IV. NS 500 ml bolus scheduled for 1700. Recheck BMP in the AM. Home losartan  will be discontinued for 12/5  12/5: recheck BMP today. Pending renal function and electrolytes, lasix  20 mg IV once in the evening or IV fluid bolus will be given.   Acute on chronic anemia 01/08/24 iron studies do not show iron deficiency. No B12 or folate deficiency either. Last CBC from 2023. Will need outpatient f/u.  Anemia Workup Lab Results  Component Value Date/Time   VITAMINB12 380 01/08/2024 05:41 AM   FOLATE >20.0 01/08/2024 05:41 AM  FERRITIN 24 01/08/2024 05:41 AM   TIBC 318 01/08/2024 05:41 AM   IRON 54 01/08/2024 05:41 AM   RETICCTPCT 1.2 01/08/2024 05:41 AM  12/4: hgb on 12/3 reviewed, hgb is 9.9  Generalized weakness 01/08/24 due to CHF exacerbation.  12/3: PT evaluated patient and recommends PT min 2x/week  SOB (shortness of breath) Secondary  to CHF exacerbation, currently on RA  Swelling of lower leg Chronic and at baseline Patient states she does not like to wear compression socks because they are difficult to pull up. She does wear regular socks at home that do not have grips underfoot. I discussed mild compression socks that have pressure in the range of 8 mmhg - 15 mmhg which are easy to take on or off.   Daughter at bedside states the will look into getting those for patient to try.   Elevated troponin Troponins: 44 and then 42; Likely due demand ischemia from CHF. No other Troponin values in Epic to compare. Low clinical suspicion for ACS given no chest pain and now no shortness of breath and EKG on 12/2 reviewed with negative for ischemic changes  Recurrent major depression in partial remission Continue lexapro  10 mg daily  HLD (hyperlipidemia) Continue crestor  10 mg daily.  Pulmonary hypertension, moderate to severe [facility]) Continue outpatient followup with pulmonologist and cardiologist as appropriate  Essential (primary) hypertension Continue hydralazine  100 mg tid, losartan  100 mg daily.  CKD stage 3b, GFR 30-44 ml/min (HCC) - baseline Scr 1.35-1.65 Baseline Scr 1.35-1.65. monitor Scr while on losartan  and IV lasix . Check BMP today and monitor renal function and electrolytes  Chronic anticoagulation - on eliquis  for PAF Remains on Eliquis  2.5 mg bid.  Paroxysmal atrial fibrillation (HCC) Continue Eliquis  2.5 mg bid. Not on betablocker due to bradycardia in the past while on beta-blockers.  At risk for falling Increased weakness and balance difficulty the day of ED presentation Difficulty with ambulation due to balance problems Home health PT, RN, aide placed TOC consulted  DVT prophylaxis: Eliquis  2.5 mg PO BID Code Status: full code Family Communication: updated daughter, Myisha Pickerel at bedside with patient's permission. Disposition Plan:  Level of care: Hospital at Home Med-Surg  Consultants:   PT, OT, TOC  Procedures:  None indicated  Antimicrobials: None indicated at this time   Subjective:  At bedside, patient is able to confirm her first and last name and her DOB: 08-21-1932.   She does not appear to be in acute distress. She reports she is feeling well. She endorses sock indentation de marking above her ankles mild swelling that is nonpitting. She states this is her baseline.   Medic at bedside, Lauraine Faes, confirmed no pitting of her lower ankles.  She denies chest pain, shortness of breath, nausea, vomiting, abdominal pain, dysuria, hematuria, constipation. She reports she has not had a BM today yet and at baseline, she has loose stool multiple times per day.   Objective: Vitals:   01/09/24 1700 01/09/24 2000 01/10/24 0810 01/10/24 0904  BP: 134/80 (!) 126/56 125/67 130/74  Pulse: 67 77 77 73  Resp: 18   16  Temp: 98.2 F (36.8 C)   98.1 F (36.7 C)  TempSrc: Oral   Oral  SpO2: 97%   96%  Weight:    51.8 kg  Height:        Intake/Output Summary (Last 24 hours) at 01/10/2024 1043 Last data filed at 01/10/2024 0800 Gross per 24 hour  Intake 1523 ml  Output 400 ml  Net 1123 ml   Filed Weights   01/09/24 0818 01/09/24 1042 01/10/24 0904  Weight: 50.3 kg 51.6 kg 51.8 kg   Examination with assistance of medic, Lauraine Faes who was present in the home:  General exam: Appears calm and comfortable  Respiratory system: Clear to auscultation. Respiratory effort normal. Cardiovascular system: S1 & S2 heard, RRR. No JVD, murmurs, rubs, gallops or clicks. Bilateral ankle swelling that is negative for pitting (patient endorses this is baseline). Gastrointestinal system: Abdomen is nondistended, soft and nontender. No organomegaly or masses felt. Normal bowel sounds heard. Central nervous system: Alert and oriented. No focal neurological deficits. Extremities: Patient was able to move extremities Skin: No visible rashes, lesions or ulcers observed on exposed  skin. Psychiatry: Judgement and insight appear normal. Mood & affect appropriate.   Data Reviewed: I have personally reviewed following labs and imaging studies  CBC: Recent Labs  Lab 01/07/24 1516 01/08/24 0541  WBC 3.9* 3.5*  NEUTROABS 2.3 1.9  HGB 9.8* 9.9*  HCT 29.9* 29.4*  MCV 96.8 95.8  PLT 226 221   Basic Metabolic Panel: Recent Labs  Lab 01/07/24 1516 01/08/24 0541 01/09/24 1146  NA 135 137 135  K 4.4 3.7 4.7  CL 99 97* 101  CO2 22 27 25   GLUCOSE 115* 102* 99  BUN 23 18 30*  CREATININE 1.55* 1.31* 1.89*  CALCIUM  9.0 8.9 8.6*  MG  --  2.0 1.9  PHOS  --  4.3  --    GFR: Estimated Creatinine Clearance: 14.5 mL/min (A) (by C-G formula based on SCr of 1.89 mg/dL (H)).  Liver Function Tests: Recent Labs  Lab 01/07/24 1516 01/08/24 0541  AST 34 28  ALT 15 16  ALKPHOS 53 42  BILITOT 0.3 0.7  PROT 7.3 6.2*  ALBUMIN  4.5 3.5   Recent Labs  Lab 01/07/24 1516  LIPASE 67*   Coagulation Profile: Recent Labs  Lab 01/08/24 0541  INR 1.2   BNP (last 3 results) Recent Labs    01/07/24 1516  PROBNP 2,335.0*   Thyroid  Function Tests: Recent Labs    01/08/24 0541  TSH 8.223*   Anemia Panel: Recent Labs    01/08/24 0541  VITAMINB12 380  FOLATE >20.0  FERRITIN 24  TIBC 318  IRON 54  RETICCTPCT 1.2   Sepsis Labs: Recent Labs  Lab 01/07/24 1516 01/07/24 1717 01/08/24 0541  PROCALCITON  --   --  <0.10  LATICACIDVEN 1.4 1.0  --    Recent Results (from the past 240 hours)  Resp panel by RT-PCR (RSV, Flu A&B, Covid) Anterior Nasal Swab     Status: None   Collection Time: 01/07/24  3:16 PM   Specimen: Anterior Nasal Swab  Result Value Ref Range Status   SARS Coronavirus 2 by RT PCR NEGATIVE NEGATIVE Final    Comment: (NOTE) SARS-CoV-2 target nucleic acids are NOT DETECTED.  The SARS-CoV-2 RNA is generally detectable in upper respiratory specimens during the acute phase of infection. The lowest concentration of SARS-CoV-2 viral copies  this assay can detect is 138 copies/mL. A negative result does not preclude SARS-Cov-2 infection and should not be used as the sole basis for treatment or other patient management decisions. A negative result may occur with  improper specimen collection/handling, submission of specimen other than nasopharyngeal swab, presence of viral mutation(s) within the areas targeted by this assay, and inadequate number of viral copies(<138 copies/mL). A negative result must be combined with clinical observations, patient history, and epidemiological information. The  expected result is Negative.  Fact Sheet for Patients:  bloggercourse.com  Fact Sheet for Healthcare Providers:  seriousbroker.it  This test is no t yet approved or cleared by the [place]  FDA and  has been authorized for detection and/or diagnosis of SARS-CoV-2 by FDA under an Emergency Use Authorization (EUA). This EUA will remain  in effect (meaning this test can be used) for the duration of the COVID-19 declaration under Section 564(b)(1) of the Act, 21 U.S.C.section 360bbb-3(b)(1), unless the authorization is terminated  or revoked sooner.       Influenza A by PCR NEGATIVE NEGATIVE Final   Influenza B by PCR NEGATIVE NEGATIVE Final    Comment: (NOTE) The Xpert Xpress SARS-CoV-2/FLU/RSV plus assay is intended as an aid in the diagnosis of influenza from Nasopharyngeal swab specimens and should not be used as a sole basis for treatment. Nasal washings and aspirates are unacceptable for Xpert Xpress SARS-CoV-2/FLU/RSV testing.  Fact Sheet for Patients: bloggercourse.com  Fact Sheet for Healthcare Providers: seriousbroker.it  This test is not yet approved or cleared by the [place]  FDA and has been authorized for detection and/or diagnosis of SARS-CoV-2 by FDA under an Emergency Use Authorization (EUA). This EUA  will remain in effect (meaning this test can be used) for the duration of the COVID-19 declaration under Section 564(b)(1) of the Act, 21 U.S.C. section 360bbb-3(b)(1), unless the authorization is terminated or revoked.     Resp Syncytial Virus by PCR NEGATIVE NEGATIVE Final    Comment: (NOTE) Fact Sheet for Patients: bloggercourse.com  Fact Sheet for Healthcare Providers: seriousbroker.it  This test is not yet approved or cleared by the [place]  FDA and has been authorized for detection and/or diagnosis of SARS-CoV-2 by FDA under an Emergency Use Authorization (EUA). This EUA will remain in effect (meaning this test can be used) for the duration of the COVID-19 declaration under Section 564(b)(1) of the Act, 21 U.S.C. section 360bbb-3(b)(1), unless the authorization is terminated or revoked.  Performed at [facility], [address] [place], [place] [address]   MRSA Next Gen by PCR, Nasal     Status: None   Collection Time: 01/07/24 10:54 PM   Specimen: Nasal Mucosa; Nasal Swab  Result Value Ref Range Status   MRSA by PCR Next Gen NOT DETECTED NOT DETECTED Final    Comment: (NOTE) The GeneXpert MRSA Assay (FDA approved for NASAL specimens only), is one component of a comprehensive MRSA colonization surveillance program. It is not intended to diagnose MRSA infection nor to guide or monitor treatment for MRSA infections. Test performance is not FDA approved in patients less than 66 years old. Performed at [facility], [address] [place], [place] [address]     Scheduled Meds:  acidophilus  1 capsule Oral Q supper   apixaban   2.5 mg Oral BID   escitalopram   20 mg Oral QPM   gabapentin   100 mg Oral BID   gabapentin   200 mg Oral QHS   hydrALAZINE   100 mg Oral TID   potassium chloride   20 mEq Oral BID   rosuvastatin   10 mg Oral Once per day on Tuesday Friday    LOS: 3 days   Time spent: 50  minutes  Dr. Sherre [facility] If 7PM-7AM, please contact night-coverage 01/10/2024, 10:43 AM

## 2024-01-10 NOTE — Progress Notes (Addendum)
 78- RN introduction completed with Caregiver/daughter. Plan for night medication administration and [facility] contact information reviewed. Per Caregiver/daughter. The patient prefers an early bedtime and agrees to a video call close to 2000. The caregiver/daughter reported she will select the I need help button on the current health tablet when ready for medications for the patient.      Call Video -2007 RN introduction, Patient identifiers completed. Yellow Fall Risk and Patient ID armband identified. Patient sitting up at a table with daughter/caregiver present. Patient A&O x4. Patient confirmed she is ready for PM medications and preparing for bed. Patient denies pain or need for PRN Tylenol . Caregiver reported and patient confirmed, last Urine output since this morning is 600cc, last BM with some unmeasured urine was this morning, and most recent intake was 48ozs. Patient consumed three medications and tolerated them without concern. All patient/caregiver questions were answered. Assessment completed. Fluctuation in SpO2 readings (89-91%) discussed. Patient has on heavy sweater, denies Shortness of breath. Patient/daughter informed Tax Inspector will continue to monitor patient overnight; both encouraged to call as needed. [facility] contact information reviewed.     2046-Inbound call received from patient's caregiver/daughter who reported after removing patient's socks and shoes and getting patient to bed she noticed swelling in patient's legs and feet. Caregiver/daughter reported she took a picture and agreed to a video call on the tablet for virtual RN assessment.      2050-Video call- Caregiver/daughter provided view of patient's legs and feet via current health tablet. Caregiver/daughter reported and confirmed the patient had shoes and snug socks on most of the day. The caregiver/daughter reported that swelling has gone down since the patient has been in bed with feet elevated, and the patient is asymptomatic  related to CHF exacerbation. Caregiver encouraged  and agreed to contact RN with changes in condition, benefits of elevating legs, comfortable fitting socks and footwear without constriction in CHF patients reviewed. Caregiver confirmed the patient has an active MyChart and encouraged to upload photos taken to patient's mychart for provider review.      2114-Night APP notified of caregiver/daughter reported concerns related to patient's leg swelling. No new orders, suggestions for leg elevation acknowledged.

## 2024-01-10 NOTE — Plan of Care (Signed)
  Problem: Education: Goal: Knowledge of General Education information will improve Description: Including pain rating scale, medication(s)/side effects and non-pharmacologic comfort measures Outcome: Progressing   Problem: Clinical Measurements: Goal: Ability to maintain clinical measurements within normal limits will improve Outcome: Progressing   Problem: Clinical Measurements: Goal: Will remain free from infection Outcome: Progressing   Problem: Clinical Measurements: Goal: Cardiovascular complication will be avoided Outcome: Progressing   Problem: Activity: Goal: Risk for activity intolerance will decrease Outcome: Progressing   Problem: Nutrition: Goal: Adequate nutrition will be maintained Outcome: Progressing   Problem: Elimination: Goal: Will not experience complications related to bowel motility Outcome: Progressing Goal: Will not experience complications related to urinary retention Outcome: Progressing   Problem: Safety: Goal: Ability to remain free from injury will improve Outcome: Progressing   Problem: Skin Integrity: Goal: Risk for impaired skin integrity will decrease Outcome: Progressing   Problem: Cardiac: Goal: Ability to achieve and maintain adequate cardiopulmonary perfusion will improve Outcome: Progressing

## 2024-01-10 NOTE — Progress Notes (Signed)
 Assisted patient with virtual OT session. Needs identified, OT to deliver some assistive devices to our hub for  paramedic to deliver to pt. Also discussed need for [facility] with MD.

## 2024-01-10 NOTE — Progress Notes (Signed)
 Pt called to take her 1400 medications, daughter at her side. Pt stated she had drank 2 cups of coffee and 1 cup of water since the am visit (she had 480 cc water at that visit). Discussed labs with her and doctor's instruction to push water intake.

## 2024-01-10 NOTE — Progress Notes (Signed)
 Occupational Therapy Treatment Patient Details Name: Lorraine Page MRN: [id number] DOB: [birth year] Today's Date: 01/10/2024   History of present illness 89 yo female admitted for workup CHF exacerbation PMH HTN, CKDIII, Afib, CVA, CHF, afib on eliquis , HLD, depresssion   OT comments  Session completed virtually with RN present.  Pt is making great progress towards their acute OT goals. Session focused on pt's RUE/R hand due to pt's concern with functional use of her dominant hand and arthritic pain. Reviewed adaptive equipment: build up foam, dysem and jar openers; as well as theraputty and squeeze ball exercises for FM coordination and strengthening. Pt reports she is managing self care WFL. Emphasized use of rollator for all functional mobility to reduce the risk of falling. OT to continue to follow acutely to facilitate progress towards established goals. Pt will continue to benefit from [facility].       If plan is discharge home, recommend the following:  A little help with walking and/or transfers;A little help with bathing/dressing/bathroom;Assistance with cooking/housework;Assist for transportation;Help with stairs or ramp for entrance   Equipment Recommendations  None recommended by OT       Precautions / Restrictions Precautions Precautions: Fall Recall of Precautions/Restrictions: Intact Restrictions Weight Bearing Restrictions Per Provider Order: No              ADL either performed or assessed with clinical judgement   ADL Overall ADL's : Needs assistance/impaired Eating/Feeding: Set up Eating/Feeding Details (indicate cue type and reason): pt has difficulty with jars - discussed using adaptive jar openers. pt also has a hard time with holding utencils - gave pt built up foam Grooming: Set up Grooming Details (indicate cue type and reason): gaave built up foam for toothbrush and hair brush         Toilet Transfer Details (indicate cue type and reason): pt's  rollator is too small for bathroom.       Tub/Shower Transfer Details (indicate cue type and reason): pt has had one fall getting out of the shower   General ADL Comments: pt's main concern is the limited use of her R hand. session focused on education about adaptive devices and exercises for CVA and arthritis    Extremity/Trunk Assessment Upper Extremity Assessment Upper Extremity Assessment: RUE deficits/detail RUE Deficits / Details: pt expressed concern of RUE deficits from prior stroke and arthritic pain. Educated on adaptive tools to use for ADLs and using heat prior to RUE exercises RUE Coordination: decreased fine motor   Lower Extremity Assessment Lower Extremity Assessment: Defer to PT evaluation        Vision   Vision Assessment?: No apparent visual deficits   Perception Perception Perception: Not tested   Praxis Praxis Praxis: Not tested   Communication Communication Communication: No apparent difficulties   Cognition Arousal: Alert Behavior During Therapy: WFL for tasks assessed/performed Cognition: No apparent impairments             OT - Cognition Comments: Overall WFL                 Following commands: Intact        Cueing   Cueing Techniques: Verbal cues  Exercises Exercises: Other exercises, Hand activities Other Exercises Other Exercises: discussed general AAROM of R hand and digits, apply heat prior for pain management Other Exercises: discussed theraputty exercises for R hand - theraputty and HEP provided       General Comments VSS - pt's daughter present    Pertinent Vitals/ Pain  Pain Assessment Pain Assessment: No/denies pain         Frequency  Min 1X/week        Progress Toward Goals  OT Goals(current goals can now be found in the care plan section)  Progress towards OT goals: Progressing toward goals  Acute Rehab OT Goals Patient Stated Goal: to get stronger OT Goal Formulation: With patient Time  For Goal Achievement: 01/22/24 Potential to Achieve Goals: Good ADL Goals Pt Will Perform Upper Body Dressing: Independently;sitting Pt Will Perform Lower Body Dressing: Independently;sitting/lateral leans;sit to/from stand Pt Will Transfer to Toilet: Independently;ambulating;regular height toilet   AM-PAC OT 6 Clicks Daily Activity     Outcome Measure   Help from another person eating meals?: None Help from another person taking care of personal grooming?: A Little Help from another person toileting, which includes using toliet, bedpan, or urinal?: A Little Help from another person bathing (including washing, rinsing, drying)?: A Little Help from another person to put on and taking off regular upper body clothing?: A Little Help from another person to put on and taking off regular lower body clothing?: A Little 6 Click Score: 19    End of Session    OT Visit Diagnosis: Muscle weakness (generalized) (M62.81)   Activity Tolerance Patient tolerated treatment well   Patient Left in CPM;with family/visitor present   Nurse Communication Mobility status        Time: 8484-8464 OT Time Calculation (min): 20 min  Charges: OT General Charges $OT Visit: 1 Visit OT Treatments $Self Care/Home Management : 8-22 mins  Lucie Kendall, OTR/L Acute Rehabilitation Services Office [phone] Secure Chat Communication Preferred   Lucie JONETTA Kendall 01/10/2024, 4:08 PM

## 2024-01-10 NOTE — Progress Notes (Addendum)
 Pt seen for routine [facility] visit. Pt appears well and states she slept very well. Pt's daughter present.   Vital signs and assessment obtained as noted. Pt's legs have mild swelling above the sock line that is non-pitting. Pt's lungs are clear and equal bilaterally upon auscultation.   Pt has been weighing herself daily and Pt has also been weighed during Paramedic daily visits, for clarification the following have been recorded:  -01/09/24, AM BEFORE breakfast with NO clothes or shoes, using patient scale : 111 lb -01/09/24, AM AFTER breakfast WITH clothes and shoes using [facility] current health scale: 113.7 lb  -01/10/24, AM BEFORE breakfast with NO clothes or shoes, using patient scale: 114.2 lb. -01/10/24, AM AFTER breakfast WITH clothes and shoes, using [facility] current health scale: 117.0 lb  Pt had virtual visit with Dr. Sherre and Jon, RN.   Labs drawn and IV care completed including new curos cap.  Pt and daughter encouraged to call virtual RN with any problems or questions.   Pt and family informed of virtual Physical Therapy appt today at 3pm.

## 2024-01-10 NOTE — Assessment & Plan Note (Signed)
 Chronic and at baseline Patient states she does not like to wear compression socks because they are difficult to pull up. She does wear regular socks at home that do not have grips underfoot. I discussed mild compression socks that have pressure in the range of 8 mmhg - 15 mmhg which are easy to take on or off.   Daughter at bedside states the will look into getting those for patient to try.

## 2024-01-10 NOTE — TOC Transition Note (Signed)
 Transition of Care [facility]) - Discharge Note   Patient Details  Name: Lorraine Page MRN: [id number] Date of Birth: 05-29-1932  [facility] CM/SW Contact:  Corean JAYSON Canary, RN Phone Number: 01/10/2024, 4:07 PM   Clinical Narrative:     Beatris to Daughter Angeliyah Kirkey on the phone about home health.  The patient had [facility] last time she needed home health.  Consented to have that agency again. Messaged Darleene Gowda and was accepted for [employer] for PT RN OT and aide.  Denies any need for additional DME.   Patient will likely discharge tomorrow, pending lab results.   Final next level of care: Home w Home Health Services     Patient Goals and CMS Choice Patient states their goals for this hospitalization and ongoing recovery are:: home with home health          Discharge Placement                       Discharge Plan and Services Additional resources added to the After Visit Summary for                            [facility] Arranged: PT, OT, Nurse's Aide, RN [facility] Agency: [facility] Date [facility] Agency Contacted: 01/10/24 Time HH Agency Contacted: [id number] Representative spoke with at [facility] Agency: Darleene  Social Drivers of Health (SDOH) Interventions SDOH Screenings   Food Insecurity: No Food Insecurity (01/07/2024)  Housing: Low Risk  (01/07/2024)  Transportation Needs: No Transportation Needs (01/07/2024)  Utilities: Not At Risk (01/07/2024)  Depression (PHQ2-9): Low Risk  (09/11/2022)  Social Connections: Unknown (01/08/2024)  Tobacco Use: Medium Risk (01/09/2024)     Readmission Risk Interventions     No data to display

## 2024-01-10 NOTE — Progress Notes (Signed)
 Completed virtual rounds with MD,paramedic at patient bedside. POC reviewed and discussed ,patient voices understanding and agreement. Pt reminded to call RN for any needs, RN and MD available at all times. Pt voices understanding. Pt aware of next planned visit and next call from RN.    Discussed daily weights with patient and her daughter. Pt does consistently weigh herself on the same scale every morning after awakening. Discussed weight parameters to call Md, and written on her form that she records her weights.

## 2024-01-11 ENCOUNTER — Other Ambulatory Visit: Payer: Self-pay | Admitting: Cardiology

## 2024-01-11 ENCOUNTER — Other Ambulatory Visit (HOSPITAL_COMMUNITY): Payer: Self-pay

## 2024-01-11 DIAGNOSIS — I10 Essential (primary) hypertension: Secondary | ICD-10-CM

## 2024-01-11 MED ORDER — FUROSEMIDE 10 MG/ML IJ SOLN
20.0000 mg | Freq: Once | INTRAMUSCULAR | Status: DC
  Filled 2024-01-11: qty 2

## 2024-01-11 MED ORDER — GERHARDT'S BUTT CREAM
1.0000 | TOPICAL_CREAM | Freq: Three times a day (TID) | CUTANEOUS | 0 refills | Status: AC | PRN
  Filled 2024-01-11: qty 10, 4d supply, fill #0

## 2024-01-11 MED ORDER — FUROSEMIDE 20 MG PO TABS
20.0000 mg | ORAL_TABLET | Freq: Two times a day (BID) | ORAL | 1 refills | Status: AC
  Filled 2024-01-11: qty 60, 30d supply, fill #0

## 2024-01-11 MED ORDER — FUROSEMIDE 20 MG PO TABS
20.0000 mg | ORAL_TABLET | Freq: Once | ORAL | Status: DC
  Filled 2024-01-11 (×3): qty 1

## 2024-01-11 NOTE — Progress Notes (Signed)
 The pt was seen today for her second visit by the hospital at home field team.  The pt was alert and oriented to person,place,time and event. Her skin was warm,dry and normal in color. The pt states she had a good day today and drinking her fluids like she was told to do so.   Vitals were taken and are as noted. The pt states she was having some pain in her arm and was waiting to take her nigtht time med for same. The pt was given 2 of her medications with the virtual nurse at this time.    The pt was given the thing from OT and told about the different exercises that could be done at home. The pt's daughter stated she would help her as much as she can. The pt's blood was drawn.  The pt and her daughter were asked if they needed anything else for the night and they denied same. The pt stated she would call the virtual nurse closer to her bedtime fort he remainder of her medications. They were informed to call if anything were to change and they agreed.   PT's visit was complete at this time.

## 2024-01-11 NOTE — Progress Notes (Addendum)
 Arrived to patient and daughter   patient is alert and oriented   patient looks more tired than the last couple of days   she states she slept well   vitals are stable   patient is given TOC meds that were picked up for her   she is advised that the virtual RN and Doctor would be in contact in a few minutes to go over the discharge instructions with them  IV was pulled and bandaged  patient states her pain is a 7 this morning but she states this is the normal for her   patient has no other complaints this morning    lung and heart sounds are normal   bowel sounds are normal as well

## 2024-01-11 NOTE — Progress Notes (Signed)
 Reviewed dc instructions with patient virtually, MD Cox also reviewed instructions extensively. All questions answered. Pt's daughter at bedside.

## 2024-01-11 NOTE — Discharge Summary (Addendum)
 Physician Discharge Summary   Patient: Lorraine Page MRN: [id number] DOB: [birth year]  Admit date:     01/07/2024  Discharge date: 01/11/24  Discharge Physician: Dr. Sherre   PCP: Daryl Setter, NP   Recommendations at discharge:    Furosemide  (lasix ) 20 mg BID has been prescribed. Take 1 tablet (20 mg) daily. Weigh yourself after urinating and a bowel movement (if you have them in the AM) and if you have gained 5 pounds compared to yesterday, take an extra dose of Furosemide  20 mg at noon that day.  You can take 40-60 oz of fluids per day (I recommend water, herbal (no caffeine) tea). Avoid sodas, alcohol.  Maintain a lower sodium diet by avoiding processed meats (ham, salami, etc). Wear mild compression stockings (with grips on the bottom) that are 8-12 mmHg especially with increased standing days. Keep your legs elevated when sitting and laying down if possible. See your primary care provider within 2 weeks of discharge.  See your cardiologist within 4 weeks of discharge or sooner.   Discharge Diagnoses:  Principal Problem:   Acute on chronic diastolic congestive heart failure (HCC) Active Problems:   AKI (acute kidney injury)   SOB (shortness of breath)   Generalized weakness   Acute on chronic anemia   Paroxysmal atrial fibrillation (HCC)   Chronic anticoagulation - on eliquis  for PAF   CKD stage 3b, GFR 30-44 ml/min (HCC) - baseline Scr 1.35-1.65   Essential (primary) hypertension   Pulmonary hypertension, moderate to severe (HCC)   HLD (hyperlipidemia)   Recurrent major depression in partial remission   Elevated troponin   Swelling of lower leg   At risk for falling  Resolved Problems:   * No resolved hospital problems. *  Hospital Course:  CC: weakness, pain in legs HPI: Lorraine Page is a 89 y.o. female with medical history significant for chronic diastolic heart failure, Pfib, chronically anticoagulated on Eliquis , essential hypertension, hyperlipidemia,  CKD 3B with baseline creatinine 1.3-1.7, anemia of chronic disease with baseline hemoglobin 11-12, who is admitted to [facility] on 01/07/2024 by way of transfer from [facility] with acute on chronic diastolic heart failure after presenting from home to the latter facility complaining of shortness of breath.  She has a history of chronic diastolic heart failure, with most recent echocardiogram performed in November 2020, which was notable for LVEF 55 to 60%, grade 3 diastolic dysfunction, mildly reduced right ventricular systolic function, mildly dilated bilateral atria, moderate mitral digitation as well as moderate tricuspid regurgitation.  Her cardiac history is also notable for paroxysmal atrial fibrillation for which she is chronically anticoagulated on Eliquis .  Not on any AV nodal blocking agents at home.  12/2: pt presented to ED for weaknes 12/2: pt admitted to [facility] for [facility] diastolic heart failure 12/3: transferred to H@H  service   12/4: I assumed care of the patient. Strict I/Os initiated.  Addendum at 1300: sCr noted to be elevated to 1.89/eGFR 25 from 1.31/39 yesterday. Stop evening lasix  20 mg IV. NS 500 ml bolus scheduled for 1700. Recheck BMP in the AM.  12/5: We will recheck BMP today and evaluate patient's renal function. If indicated we will give her fluids this evening or administer an additional dose of lasix  in the evening medic visit.  Pt has planned virtual visit with PT today at approximately 3p.   HH PT, RN, and nurse aide placed for patient for discharge. TOC consulted.  12/6: Lasix  20 mg  PO once today. No kchlor po indicated given serum potassium yesterday was 5. Patient is ready for discharged today.  Assessment and Plan:  * Acute on chronic diastolic congestive heart failure (HCC) 12/4: Continue furosemide  20 mg IV BID Strict I/Os initiated, RN aware and will place orders for HAT on toilet Per patient and daughter at  bedside, patient states the swelling of her lower extremities are doing better 12/5: resolved 12/6: Furosemide  20 mg daily + one additional tablet prn for weight gain of > 5 pounds, prescribed. Patient can drink 40-60 oz of water per day. Instructions for patients to weigh herself every morning after BM and urination.  AKI (acute kidney injury) Addendum at 1300: sCr noted to be elevated to 1.89/eGFR 25 from 1.31/39 yesterday. Stop evening lasix  20 mg IV. NS 500 ml bolus scheduled for 1700. Recheck BMP in the AM. Home losartan  will be discontinued for 12/5  12/5: recheck BMP today. Pending renal function and electrolytes, lasix  20 mg IV once in the evening or IV fluid bolus will be given.   12/6: sCr/eGFR: 1.09/49. Acute kidney injury has resolved. Lasix  20 mg daily resumed  Acute on chronic anemia 01/08/24 iron studies do not show iron deficiency. No B12 or folate deficiency either. Last CBC from 2023. Will need outpatient f/u.  Anemia Workup Lab Results  Component Value Date/Time   VITAMINB12 380 01/08/2024 05:41 AM   FOLATE >20.0 01/08/2024 05:41 AM   FERRITIN 24 01/08/2024 05:41 AM   TIBC 318 01/08/2024 05:41 AM   IRON 54 01/08/2024 05:41 AM   RETICCTPCT 1.2 01/08/2024 05:41 AM  12/4: hgb on 12/3 reviewed, hgb is 9.9  Generalized weakness 01/08/24 due to CHF exacerbation.  12/3: PT evaluated patient and recommends PT min 2x/week  SOB (shortness of breath) Secondary to CHF exacerbation, currently on RA  Swelling of lower leg Chronic and at baseline Patient states she does not like to wear compression socks because they are difficult to pull up. She does wear regular socks at home that do not have grips underfoot. I discussed mild compression socks that have pressure in the range of 8 mmhg - 15 mmhg which are easy to take on or off.   Daughter at bedside states the will look into getting those for patient to try.   Elevated troponin Troponins: 44 and then 42; Likely due  demand ischemia from CHF. No other Troponin values in Epic to compare. Low clinical suspicion for ACS given no chest pain and now no shortness of breath and EKG on 12/2 reviewed with negative for ischemic changes  Recurrent major depression in partial remission Continue lexapro  10 mg daily  HLD (hyperlipidemia) Continue crestor  10 mg daily.  Pulmonary hypertension, moderate to severe [facility]) Continue outpatient followup with pulmonologist and cardiologist as appropriate  Essential (primary) hypertension Continue hydralazine  100 mg tid, losartan  100 mg daily.  CKD stage 3b, GFR 30-44 ml/min (HCC) - baseline Scr 1.35-1.65 Baseline Scr 1.35-1.65. monitor Scr while on losartan  and IV lasix . Check BMP today and monitor renal function and electrolytes  Chronic anticoagulation - on eliquis  for PAF Remains on Eliquis  2.5 mg bid.  Paroxysmal atrial fibrillation (HCC) Continue Eliquis  2.5 mg bid. Not on betablocker due to bradycardia in the past while on beta-blockers.  At risk for falling Increased weakness and balance difficulty the day of ED presentation Difficulty with ambulation due to balance problems Home health PT, RN, aide placed TOC consulted     Pain control - [place]  Controlled Substance  Reporting System database was reviewed. and patient was instructed, not to drive, operate heavy machinery, perform activities at heights, swimming or participation in water activities or provide baby-sitting services while on Pain, Sleep and Anxiety Medications; until their outpatient Physician has advised to do so again. Also recommended to not to take more than prescribed Pain, Sleep and Anxiety Medications.  Consultants: PT, OT, RN, TOC Procedures performed: none  Disposition: Home Diet recommendation:  Cardiac diet DISCHARGE MEDICATION: Allergies as of 01/11/2024       Reactions   Penicillins Anaphylaxis, Swelling   Local swelling, throat swelling   Sulfa Antibiotics Anaphylaxis,  Hives   Hives   Beta Adrenergic Blockers Other (See Comments)   bradycardia        Medication List     TAKE these medications    acetaminophen  650 MG CR tablet Commonly known as: TYLENOL  Take 1,300 mg by mouth in the morning, at noon, and at bedtime.   CALCIUM  600+D3 PO Take 1 tablet by mouth daily.   cimetidine 200 MG tablet Commonly known as: TAGAMET Take 200 mg by mouth daily.   Eliquis  2.5 MG Tabs tablet Generic drug: apixaban  TAKE 1 TABLET BY MOUTH 2 TIMES A DAY   escitalopram  20 MG tablet Commonly known as: LEXAPRO  TAKE 1 TABLET BY MOUTH EVERY EVENING   furosemide  20 MG tablet Commonly known as: Lasix  Take 1 tablet (20 mg total) by mouth 2 (two) times daily. What changed:  how much to take how to take this when to take this additional instructions   gabapentin  100 MG capsule Commonly known as: NEURONTIN  TAKE 2 CAPSULES BY MOUTH 3 TIMES A DAY What changed:  how much to take when to take this additional instructions   Gerhardt's butt cream Crea Apply 1 Application topically 3 (three) times daily as needed for irritation.   hydrALAZINE  100 MG tablet Commonly known as: APRESOLINE  TAKE 1 TABLET BY MOUTH 3 TIMES DAILY   losartan  100 MG tablet Commonly known as: COZAAR  TAKE 1 TABLET BY MOUTH EVERY DAY   multivitamin tablet Take 1 tablet by mouth daily.   PROBIOTIC DAILY PO Take 1 capsule by mouth every evening.   rosuvastatin  10 MG tablet Commonly known as: CRESTOR  TAKE 1/2 TABLET BY MOUTH TWICE PER WEEK FOR CHOLESTEROL What changed: See the new instructions.         Contact information for follow-up providers     Pietro Redell RAMAN, MD Follow up on 01/21/2024.   Specialty: Cardiology Why: Please see  my chart for time Contact information: [address] [place] [address] [phone]              Contact information for after-discharge care     Home Medical Care     [facility]) .    Service: Home Health Services Contact information: [address] [place]  [address] [phone]                    Discharge Exam: Fredricka Weights   01/10/24 9095 01/11/24 0900 01/11/24 1002  Weight: 51.8 kg 52.2 kg 118.5 kg   Physical exam completed with assistance of medic, [employer], who was present in the home with patient.   Physical Exam Constitutional:      Appearance: Normal appearance. She is normal weight.  HENT:     Head: Normocephalic and atraumatic.     Nose: Nose normal.     Mouth/Throat:  Mouth: Mucous membranes are moist.  Eyes:     General: No scleral icterus.    Extraocular Movements: Extraocular movements intact.     Conjunctiva/sclera: Conjunctivae normal.     Pupils: Pupils are equal, round, and reactive to light.  Cardiovascular:     Rate and Rhythm: Normal rate and regular rhythm.  Pulmonary:     Effort: Pulmonary effort is normal.     Breath sounds: Normal breath sounds.  Abdominal:     General: Bowel sounds are normal.     Palpations: Abdomen is soft.  Musculoskeletal:        General: Swelling present. Normal range of motion.     Cervical back: Normal range of motion.     Right lower leg: Edema present.     Left lower leg: Edema present.  Skin:    Coloration: Skin is not jaundiced or pale.  Neurological:     General: No focal deficit present.     Mental Status: She is alert and oriented to person, place, and time. Mental status is at baseline.  Psychiatric:        Mood and Affect: Mood normal.        Behavior: Behavior normal.   Condition at discharge: good  The results of significant diagnostics from this hospitalization (including imaging, microbiology, ancillary and laboratory) are listed below for reference.   Imaging Studies: ECHOCARDIOGRAM COMPLETE Result Date: 01/08/2024    ECHOCARDIOGRAM REPORT   Patient Name:   ZYLEE MARCHIANO Liburd Date of Exam: 01/08/2024 Medical Rec #:  [id number]         Height:       60.0 in Accession #:    [id number]       Weight:       116.0 lb Date of Birth:  [birth year]       BSA:          1.481 m Patient Age:    89 years         BP:           168/65 mmHg Patient Gender: F                HR:           75 bpm. Exam Location:  Inpatient Procedure: 2D Echo, Cardiac Doppler and Color Doppler (Both Spectral and Color            Flow Doppler were utilized during procedure). Indications:    CHF-Acute Diastolic I50.31  History:        Patient has prior history of Echocardiogram examinations, most                 recent 12/23/2018. CKD, CVA, Signs/Symptoms:Shortness of Breath;                 Risk Factors:Dyslipidemia, Hypertension and Eleavted Troponin,                 Pre-diabetes.  Sonographer:    Koleen Popper RDCS Referring Phys: [id number] EVA NOVAK PORE  Sonographer Comments: Image acquisition challenging due to patient body habitus. IMPRESSIONS  1. Left ventricular ejection fraction, by estimation, is 60 to 65%. The left ventricle has normal function. The left ventricle has no regional wall motion abnormalities. Left ventricular diastolic function could not be evaluated.  2. Right ventricular systolic function is normal. The right ventricular size is normal. There is moderately elevated pulmonary artery systolic pressure. The estimated right ventricular systolic pressure is 46.3 mmHg.  3. Left  atrial size was severely dilated.  4. The mitral valve is normal in structure. Mild mitral valve regurgitation. Mild mitral stenosis. Severe mitral annular calcification.  5. The aortic valve was not well visualized. Aortic valve regurgitation is not visualized. Aortic valve sclerosis/calcification is present, without any evidence of aortic stenosis.  6. The inferior vena cava is normal in size with greater than 50% respiratory variability, suggesting right atrial pressure of 3 mmHg. Comparison(s): Changes from prior study are noted. No evidence of TR and RV size/function has normalized.  FINDINGS  Left Ventricle: Left ventricular ejection fraction, by estimation, is 60 to 65%. The left ventricle has normal function. The left ventricle has no regional wall motion abnormalities. The left ventricular internal cavity size was normal in size. There is  no left ventricular hypertrophy. Left ventricular diastolic function could not be evaluated due to mitral annular calcification (moderate or greater). Left ventricular diastolic function could not be evaluated. Right Ventricle: The right ventricular size is normal. No increase in right ventricular wall thickness. Right ventricular systolic function is normal. There is moderately elevated pulmonary artery systolic pressure. The tricuspid regurgitant velocity is 3.29 m/s, and with an assumed right atrial pressure of 3 mmHg, the estimated right ventricular systolic pressure is 46.3 mmHg. Left Atrium: Left atrial size was severely dilated. Right Atrium: Right atrial size was normal in size. Pericardium: There is no evidence of pericardial effusion. Mitral Valve: The mitral valve is normal in structure. Severe mitral annular calcification. Mild mitral valve regurgitation. Mild mitral valve stenosis. MV peak gradient, 13.4 mmHg. The mean mitral valve gradient is 5.0 mmHg with average heart rate of 76  bpm. Tricuspid Valve: The tricuspid valve is normal in structure. Tricuspid valve regurgitation is not demonstrated. No evidence of tricuspid stenosis. Aortic Valve: The aortic valve was not well visualized. Aortic valve regurgitation is not visualized. Aortic valve sclerosis/calcification is present, without any evidence of aortic stenosis. Pulmonic Valve: The pulmonic valve was not well visualized. Pulmonic valve regurgitation is not visualized. No evidence of pulmonic stenosis. Aorta: The aortic root is normal in size and structure. Venous: The inferior vena cava is normal in size with greater than 50% respiratory variability, suggesting right atrial pressure of  3 mmHg. IAS/Shunts: No atrial level shunt detected by color flow Doppler.  LEFT VENTRICLE PLAX 2D LVIDd:         4.80 cm     Diastology LVIDs:         3.20 cm     LV e' medial:    7.14 cm/s LV PW:         0.90 cm     LV E/e' medial:  23.8 LV IVS:        0.80 cm     LV e' lateral:   8.68 cm/s LVOT diam:     1.60 cm     LV E/e' lateral: 19.6 LV SV:         55 LV SV Index:   37 LVOT Area:     2.01 cm  LV Volumes (MOD) LV vol d, MOD A2C: 96.2 ml LV vol s, MOD A2C: 31.8 ml LV SV MOD A2C:     64.4 ml RIGHT VENTRICLE             IVC RV Basal diam:  3.40 cm     IVC diam: 1.80 cm RV S prime:     12.20 cm/s TAPSE (M-mode): 1.9 cm LEFT ATRIUM  Index        RIGHT ATRIUM           Index LA diam:        4.10 cm 2.77 cm/m   RA Area:     12.10 cm LA Vol (A2C):   71.9 ml 48.56 ml/m  RA Volume:   22.70 ml  15.33 ml/m LA Vol (A4C):   70.8 ml 47.82 ml/m LA Biplane Vol: 74.7 ml 50.45 ml/m  AORTIC VALVE LVOT Vmax:   137.00 cm/s LVOT Vmean:  88.700 cm/s LVOT VTI:    0.274 m  AORTA Ao Root diam: 2.60 cm MITRAL VALVE                  TRICUSPID VALVE MV Area (PHT): 4.31 cm       TR Peak grad:   43.3 mmHg MV Area VTI:   2.60 cm       TR Vmax:        329.00 cm/s MV Peak grad:  13.4 mmHg MV Mean grad:  5.0 mmHg       SHUNTS MV Vmax:       1.83 m/s       Systemic VTI:  0.27 m MV Vmean:      107.0 cm/s     Systemic Diam: 1.60 cm MV VTI:        0.21 m MV Decel Time: 176 msec MR Peak grad:    163.8 mmHg MR Vmax:         640.00 cm/s MR Vmean:        492.0 cm/s MR PISA Nyquist: 0.3 m/s MR PISA:         1.01 cm MR PISA Radius:  0.40 cm MV E velocity: 170.00 cm/s MV A velocity: 89.50 cm/s MV E/A ratio:  1.90 Franck Azobou Tonleu Electronically signed by Joelle Ren Ny Signature Date/Time: 01/08/2024/11:03:06 AM    Final    DG Chest 2 View Result Date: 01/07/2024 EXAM: 2 VIEW(S) XRAY OF THE CHEST 01/07/2024 03:55:31 PM COMPARISON: Chest x ray 03/25/2023. CLINICAL HISTORY: weakness FINDINGS: LUNGS AND PLEURA: Lungs are  hypoinflated with possible mild vascular congestion. No lobar consolidation or effusion. No pneumothorax. HEART AND MEDIASTINUM: Mild cardiomegaly. Aortic atherosclerosis. BONES AND SOFT TISSUES: Exaggerated thoracic kyphosis. Advanced degenerative changes of the right shoulder. No acute osseous abnormality. IMPRESSION: 1. Hypoinflated  lungs and possible mild vascular congestion. 2. Mild cardiomegaly with aortic atherosclerosis. Electronically signed by: Toribio Agreste MD 01/07/2024 04:03 PM EST RP Workstation: [id number]    Microbiology: Results for orders placed or performed during the hospital encounter of 01/07/24  Resp panel by RT-PCR (RSV, Flu A&B, Covid) Anterior Nasal Swab     Status: None   Collection Time: 01/07/24  3:16 PM   Specimen: Anterior Nasal Swab  Result Value Ref Range Status   SARS Coronavirus 2 by RT PCR NEGATIVE NEGATIVE Final    Comment: (NOTE) SARS-CoV-2 target nucleic acids are NOT DETECTED.  The SARS-CoV-2 RNA is generally detectable in upper respiratory specimens during the acute phase of infection. The lowest concentration of SARS-CoV-2 viral copies this assay can detect is 138 copies/mL. A negative result does not preclude SARS-Cov-2 infection and should not be used as the sole basis for treatment or other patient management decisions. A negative result may occur with  improper specimen collection/handling, submission of specimen other than nasopharyngeal swab, presence of viral mutation(s) within the areas targeted by this assay, and inadequate number of viral copies(<138 copies/mL). A negative  result must be combined with clinical observations, patient history, and epidemiological information. The expected result is Negative.  Fact Sheet for Patients:  bloggercourse.com  Fact Sheet for Healthcare Providers:  seriousbroker.it  This test is no t yet approved or cleared by the [place]  FDA and  has been  authorized for detection and/or diagnosis of SARS-CoV-2 by FDA under an Emergency Use Authorization (EUA). This EUA will remain  in effect (meaning this test can be used) for the duration of the COVID-19 declaration under Section 564(b)(1) of the Act, 21 U.S.C.section 360bbb-3(b)(1), unless the authorization is terminated  or revoked sooner.       Influenza A by PCR NEGATIVE NEGATIVE Final   Influenza B by PCR NEGATIVE NEGATIVE Final    Comment: (NOTE) The Xpert Xpress SARS-CoV-2/FLU/RSV plus assay is intended as an aid in the diagnosis of influenza from Nasopharyngeal swab specimens and should not be used as a sole basis for treatment. Nasal washings and aspirates are unacceptable for Xpert Xpress SARS-CoV-2/FLU/RSV testing.  Fact Sheet for Patients: bloggercourse.com  Fact Sheet for Healthcare Providers: seriousbroker.it  This test is not yet approved or cleared by the [place]  FDA and has been authorized for detection and/or diagnosis of SARS-CoV-2 by FDA under an Emergency Use Authorization (EUA). This EUA will remain in effect (meaning this test can be used) for the duration of the COVID-19 declaration under Section 564(b)(1) of the Act, 21 U.S.C. section 360bbb-3(b)(1), unless the authorization is terminated or revoked.     Resp Syncytial Virus by PCR NEGATIVE NEGATIVE Final    Comment: (NOTE) Fact Sheet for Patients: bloggercourse.com  Fact Sheet for Healthcare Providers: seriousbroker.it  This test is not yet approved or cleared by the [place]  FDA and has been authorized for detection and/or diagnosis of SARS-CoV-2 by FDA under an Emergency Use Authorization (EUA). This EUA will remain in effect (meaning this test can be used) for the duration of the COVID-19 declaration under Section 564(b)(1) of the Act, 21 U.S.C. section 360bbb-3(b)(1), unless the  authorization is terminated or revoked.  Performed at [facility], [address] [place], [place] [address]   MRSA Next Gen by PCR, Nasal     Status: None   Collection Time: 01/07/24 10:54 PM   Specimen: Nasal Mucosa; Nasal Swab  Result Value Ref Range Status   MRSA by PCR Next Gen NOT DETECTED NOT DETECTED Final    Comment: (NOTE) The GeneXpert MRSA Assay (FDA approved for NASAL specimens only), is one component of a comprehensive MRSA colonization surveillance program. It is not intended to diagnose MRSA infection nor to guide or monitor treatment for MRSA infections. Test performance is not FDA approved in patients less than 53 years old. Performed at [facility], [address] [place], [place] [address]     Labs: CBC: Recent Labs  Lab 01/07/24 1516 01/08/24 0541  WBC 3.9* 3.5*  NEUTROABS 2.3 1.9  HGB 9.8* 9.9*  HCT 29.9* 29.4*  MCV 96.8 95.8  PLT 226 221   Basic Metabolic Panel: Recent Labs  Lab 01/07/24 1516 01/08/24 0541 01/09/24 1146 01/10/24 1013 01/10/24 1800  NA 135 137 135 135  --   K 4.4 3.7 4.7 5.0  --   CL 99 97* 101 104  --   CO2 22 27 25 24   --   GLUCOSE 115* 102* 99 95  --   BUN 23 18 30* 31*  --   CREATININE 1.55* 1.31* 1.89* 1.56* 1.09*  CALCIUM  9.0 8.9 8.6* 8.7*  --   MG  --  2.0 1.9  --   --   PHOS  --  4.3  --   --   --    Liver Function Tests: Recent Labs  Lab 01/07/24 1516 01/08/24 0541  AST 34 28  ALT 15 16  ALKPHOS 53 42  BILITOT 0.3 0.7  PROT 7.3 6.2*  ALBUMIN  4.5 3.5   CBG: No results for input(s): GLUCAP in the last 168 hours.  Discharge time spent: greater than 30 minutes.  Signed: Dr. Sherre Location: [place]  [facility] 01/11/2024

## 2024-01-13 ENCOUNTER — Telehealth: Payer: Self-pay

## 2024-01-13 NOTE — Transitions of Care (Post Inpatient/ED Visit) (Signed)
 01/13/2024  Name: Lorraine Page MRN: [id number] DOB: 06/22/1932  Today's TOC FU Call Status: Today's TOC FU Call Status:: Successful TOC FU Call Completed TOC FU Call Complete Date: 01/13/24  Patient's Name and Date of Birth confirmed. Name, DOB  Transition Care Management Follow-up Telephone Call Date of Discharge: 01/11/24 Discharge Facility: [facility]) Type of Discharge: Inpatient Admission Primary Inpatient Discharge Diagnosis:: CHF How have you been since you were released from the hospital?: Better Any questions or concerns?: No  Items Reviewed: Did you receive and understand the discharge instructions provided?: Yes Medications obtained,verified, and reconciled?: Yes (Medications Reviewed) Any new allergies since your discharge?: No Dietary orders reviewed?: Yes Type of Diet Ordered:: Low Sodium, Heart Healthy Do you have support at home?: Yes People in Home [RPT]: alone Name of Support/Comfort Primary Source: [name] Ferrier  Medications Reviewed Today: Medications Reviewed Today     Reviewed by Moises Reusing, RN (Case Manager) on 01/13/24 at 1042  Med List Status: <None>   Medication Order Taking? Sig Documenting Provider Last Dose Status Informant  acetaminophen  (TYLENOL ) 650 MG CR tablet [id number]  Take 1,300 mg by mouth in the morning, at noon, and at bedtime. [provider]  Active Child, Pharmacy Records  Calcium  Carb-Cholecalciferol (CALCIUM  600+D3 PO) [id number]  Take 1 tablet by mouth daily. [provider]  Active Child, Pharmacy Records  cimetidine (TAGAMET) 200 MG tablet [id number]  Take 200 mg by mouth daily.  [provider]  Active Child, Pharmacy Records           Med Note (CRUTHIS, CHLOE C   Wed Jan 08, 2024 12:54 PM) No fill hx found for this medications. Daughter is adamant the pt is taking this.   ELIQUIS  2.5 MG TABS tablet [id number]  TAKE 1 TABLET BY MOUTH 2 TIMES A DAY Crenshaw, Redell RAMAN, MD  Active Child,  Pharmacy Records  escitalopram  (LEXAPRO ) 20 MG tablet [id number] Yes TAKE 1 TABLET BY MOUTH EVERY EVENING Crenshaw, Redell RAMAN, MD  Active Child, Pharmacy Records  furosemide  (LASIX ) 20 MG tablet [id number] Yes Take 1 tablet (20 mg total) by mouth 2 (two) times daily.  Patient taking differently: Take 20 mg by mouth daily. Only take a second Lasix  if you gain 3 pounds in a day   Cox, Amy N, DO  Active   gabapentin  (NEURONTIN ) 100 MG capsule [id number]  TAKE 2 CAPSULES BY MOUTH 3 TIMES A DAY  Patient taking differently: Take 100-200 mg by mouth See admin instructions. Take 100 mg by mouth in the morning and in the afternoon. Take 200 mg by mouth at bedtime   Daryl Setter, NP  Active Child, Pharmacy Records  hydrALAZINE  (APRESOLINE ) 100 MG tablet [id number]  TAKE 1 TABLET BY MOUTH 3 TIMES DAILY Pietro Redell RAMAN, MD  Active Child, Pharmacy Records  losartan  (COZAAR ) 100 MG tablet [id number]  TAKE 1 TABLET BY MOUTH EVERY DAY Pietro Redell RAMAN, MD  Active Child, Pharmacy Records  Multiple Vitamin (MULTIVITAMIN) tablet [id number]  Take 1 tablet by mouth daily. [provider]  Active Child, Pharmacy Records  Nystatin (GERHARDT'S BUTT CREAM) CREA [id number]  Apply 1 Application topically 3 (three) times daily as needed for irritation. Cox, Amy N, DO  Active   Probiotic Product (PROBIOTIC DAILY PO) [id number]  Take 1 capsule by mouth every evening. [provider]  Active Child, Pharmacy Records  rosuvastatin  (CRESTOR ) 10 MG tablet [id number]  TAKE 1/2 TABLET BY MOUTH TWICE PER WEEK FOR CHOLESTEROL  Patient taking differently:  Take 5 mg by mouth 2 (two) times a week.   Pietro Redell RAMAN, MD  Active Child, Pharmacy Records           Med Note (CRUTHIS, [place] C   Wed Jan 08, 2024 12:53 PM) Daughter is adamant the pt is still taking this medication. Dispense report does not support this claim.   Med List Note (Cruthis, [place], CPhT 01/08/24 1253): Daughter helps with medications.              Home Care and Equipment/Supplies: Were Home Health Services Ordered?: Yes Name of Home Health Agency:: [facility] Has Agency set up a time to come to your home?: No EMR reviewed for Home Health Orders: Orders present/patient has not received call (refer to CM for follow-up) Any new equipment or medical supplies ordered?: No  Functional Questionnaire: Do you need assistance with bathing/showering or dressing?: No Do you need assistance with meal preparation?: Yes (Her daughter supplies her evening meal) Do you need assistance with eating?: No Do you have difficulty maintaining continence: Yes (Mostly bowel incontinence) Do you need assistance with getting out of bed/getting out of a chair/moving?: No Do you have difficulty managing or taking your medications?: Yes (Her daughter sets up her pill box)  Follow up appointments reviewed: PCP Follow-up appointment confirmed?: Yes (Care Guide set it up) Date of PCP follow-up appointment?: 01/23/24 Follow-up Provider: Douglass, NP Specialist Hospital Follow-up appointment confirmed?: Yes Date of Specialist follow-up appointment?: 01/21/24 Follow-Up Specialty Provider:: Redell Pietro Do you need transportation to your follow-up appointment?: No Do you understand care options if your condition(s) worsen?: Yes-patient verbalized understanding  SDOH Interventions Today    Flowsheet Row Most Recent Value  SDOH Interventions   Food Insecurity Interventions Intervention Not Indicated  Housing Interventions Intervention Not Indicated  Transportation Interventions Intervention Not Indicated  Utilities Interventions Intervention Not Indicated    Medford Balboa, BSN, RN [place]  VBC[facility] - [facility] RN Care Manager [phone]

## 2024-01-14 ENCOUNTER — Other Ambulatory Visit: Payer: Self-pay | Admitting: *Deleted

## 2024-01-14 ENCOUNTER — Ambulatory Visit: Payer: Self-pay

## 2024-01-14 DIAGNOSIS — I10 Essential (primary) hypertension: Secondary | ICD-10-CM

## 2024-01-14 MED ORDER — LOSARTAN POTASSIUM 100 MG PO TABS: 100.0000 mg | ORAL_TABLET | Freq: Every day | ORAL | 3 refills | Status: AC

## 2024-01-14 NOTE — Telephone Encounter (Signed)
 FYI Only or Action Required?: FYI only for provider: Home care.  Patient was last seen in primary care on 05/03/2023 by Daryl Setter, NP.  Called Nurse Triage reporting Eye Problem.  Symptoms began today.  Interventions attempted: Nothing.  Symptoms are: stable.  Triage Disposition: Home Care  Patient/caregiver understands and will follow disposition?: Yes  Reason for Disposition  [1] Red eye caused by sunscreen, smoke, smog, chlorine, food, soap or other mild irritant AND [2] no blurred vision  Answer Assessment - Initial Assessment Questions 1. LOCATION: Where is the redness? (e.g., eyeball or outer eyelids) Note: When callers say the eye is red, they usually mean the sclera (white of the eye) is red.       Lateral aspect of right eye  2. ONE OR BOTH EYES: Is the redness in one or both eyes?      One  3. ONSET: When did the redness start? (e.g., hours, days)      Noted today by occupational therapy  4. EYELIDS: Are the eyelids red or swollen? If Yes, ask: How much?      No  5. VISION: Do you have blurred vision?     No  6. ITCHING: Does it feel itchy? If so ask: How bad is it (Scale 1-10; or mild, moderate, severe)     No  7. PAIN: Is it painful? If Yes, ask: How bad is the pain? (Scale 0-10; or none, mild, moderate, severe)     No  8. CONTACT LENS: Do you wear contacts?     Unknown  9. CAUSE: What do you think is causing the redness?     Unknown  10. OTHER SYMPTOMS: Do you have any other symptoms? (e.g., fever, runny nose, cough, vomiting)       No, denies any other symptoms  Protocols used: Eye - Redness-A-AH

## 2024-01-14 NOTE — Telephone Encounter (Signed)
 Copied from CRM #[id number]. Topic: General - Other >> Jan 14, 2024  4:57 PM Nessti S wrote: Reason for CRM: don called in because pt right eye is red (lateral aspect). Can call daughter Daphnie Venturini [phone]

## 2024-01-15 ENCOUNTER — Telehealth: Payer: Self-pay

## 2024-01-15 NOTE — Telephone Encounter (Signed)
 Copied from CRM #[id number]. Topic: Clinical - Home Health Verbal Orders >> Jan 15, 2024 11:58 AM Burnard DEL wrote: Caller/Agency: [facility] OT Callback Number: [phone] Service Requested: Occupational Therapy Frequency: 2wk1  mobility,safety,health promotion,pain control ,ulcer prevention,heart failure education Any new concerns about the patient? No

## 2024-01-15 NOTE — Telephone Encounter (Signed)
 Appt scheduled

## 2024-01-15 NOTE — Telephone Encounter (Signed)
 LMOM for Lorraine Page, verbal orders given.

## 2024-01-23 ENCOUNTER — Encounter: Payer: Self-pay | Admitting: Family

## 2024-01-23 ENCOUNTER — Ambulatory Visit: Admitting: Family

## 2024-01-23 VITALS — BP 138/72 | HR 68 | Temp 97.6°F | Resp 14 | Ht 60.0 in | Wt 118.0 lb

## 2024-01-23 DIAGNOSIS — R7989 Other specified abnormal findings of blood chemistry: Secondary | ICD-10-CM | POA: Diagnosis not present

## 2024-01-23 DIAGNOSIS — F3341 Major depressive disorder, recurrent, in partial remission: Secondary | ICD-10-CM | POA: Diagnosis not present

## 2024-01-23 DIAGNOSIS — Z09 Encounter for follow-up examination after completed treatment for conditions other than malignant neoplasm: Secondary | ICD-10-CM

## 2024-01-23 DIAGNOSIS — I272 Pulmonary hypertension, unspecified: Secondary | ICD-10-CM | POA: Diagnosis not present

## 2024-01-23 DIAGNOSIS — F0393 Unspecified dementia, unspecified severity, with mood disturbance: Secondary | ICD-10-CM | POA: Diagnosis not present

## 2024-01-23 DIAGNOSIS — R7303 Prediabetes: Secondary | ICD-10-CM | POA: Diagnosis not present

## 2024-01-23 DIAGNOSIS — E785 Hyperlipidemia, unspecified: Secondary | ICD-10-CM | POA: Diagnosis not present

## 2024-01-23 DIAGNOSIS — I480 Paroxysmal atrial fibrillation: Secondary | ICD-10-CM | POA: Diagnosis not present

## 2024-01-23 DIAGNOSIS — I5032 Chronic diastolic (congestive) heart failure: Secondary | ICD-10-CM | POA: Diagnosis not present

## 2024-01-23 DIAGNOSIS — K219 Gastro-esophageal reflux disease without esophagitis: Secondary | ICD-10-CM | POA: Diagnosis not present

## 2024-01-23 DIAGNOSIS — N179 Acute kidney failure, unspecified: Secondary | ICD-10-CM | POA: Diagnosis not present

## 2024-01-23 DIAGNOSIS — I130 Hypertensive heart and chronic kidney disease with heart failure and stage 1 through stage 4 chronic kidney disease, or unspecified chronic kidney disease: Secondary | ICD-10-CM | POA: Diagnosis not present

## 2024-01-23 DIAGNOSIS — D631 Anemia in chronic kidney disease: Secondary | ICD-10-CM | POA: Diagnosis not present

## 2024-01-23 DIAGNOSIS — N1832 Chronic kidney disease, stage 3b: Secondary | ICD-10-CM | POA: Diagnosis not present

## 2024-01-23 DIAGNOSIS — I69351 Hemiplegia and hemiparesis following cerebral infarction affecting right dominant side: Secondary | ICD-10-CM | POA: Diagnosis not present

## 2024-01-23 NOTE — Progress Notes (Signed)
 Acute Office Visit  Subjective:     Patient ID: Lorraine Page, female    DOB: [birth year], 89 y.o.   MRN: [id number]  Chief Complaint  Patient presents with   Hospitalization Follow-up    Patient is here for a recent hospitalization, follow up    HPI Patient is in today as a follow-up from 01/07/2024 where she presented with symptoms of weakness and fatigue.  Patient was found to be in congestive heart failure and was subsequently admitted to the hospital.  Daughter reports that she spent 1 day in the hospital and was discharged.  Since that time, she has been doing well.  Denies any concerns.  She is due to follow-up with cardiology in February.  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  She is prescribed gabapentin  that typically works well but sometimes causes drowsiness.  She feels the medication works very well at night but sometimes she does not take it during the day because she is up and moving around and afraid to fall.  Review of Systems  Respiratory:  Negative for shortness of breath.   Cardiovascular:  Negative for chest pain, palpitations and leg swelling.  All other systems reviewed and are negative.  Past Medical History:  Diagnosis Date   Atrial fibrillation (HCC)    Chronic diastolic heart failure (HCC)    Chronic kidney disease, stage 3 (HCC)    CVA (cerebral vascular accident) (HCC)    GERD (gastroesophageal reflux disease)    GERD (gastroesophageal reflux disease)    Hyperlipidemia    Hypertension    Osteoarthritis    Pancreas cyst 06/21/2022   MRI notes: Numerous tiny fluid signal intensity lesions in the pancreas, with the largest lesion along the cephalad portion of the pancreatic head measuring 1.9 cm in long axis. Possibilities include dilated side ducts, postinflammatory cystic lesions, or intraductal papillary mucinous neoplasm. 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    Social History   Socioeconomic History   Marital status: Widowed    Spouse name: Not on file   Number of children: 3   Years of education: Not on file   Highest education level: Not on file  Occupational History   Not on file  Tobacco Use   Smoking status: Former   Smokeless tobacco: Never  Vaping Use   Vaping status: Never Used  Substance and Sexual Activity   Alcohol use: Not Currently   Drug use: Never   Sexual activity: Not Currently  Other Topics Concern   Not on file  Social History Narrative   Tobacco use, amount per day now: None   Has 2 sons and a daughter    Past tobacco use, amount per day:    How many years did you use tobacco: Quit 1979 ( maybe 20 years )   Alcohol use (drinks per week): None   Diet:   Do you drink/eat things with caffeine: Coffee   Marital status:    Widowed                              What year were you married? 49 years married in 1965   Do you live in a house, apartment, assisted living, condo, trailer, etc.? House  Is it one or more stories? 1   How many persons live in your home? 1   Do you have pets in your home?( please list) None.   Highest Level of education completed? High School 12th grade.   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   Do you exercise?    None                              Type and how often?   Do you have a living will? Yes   Do you have a DNR form?    No                               If not, do you want to discuss one?   Do you have signed POA/HPOA forms?  Yes                      If so, please bring to you appointment      Do you have any difficulty bathing or dressing yourself? No   Do you have any difficulty preparing food or eating? No   Do you have any difficulty managing your medications? No   Do you have any difficulty managing your finances?  Yes   Do you have any difficulty affording your medications? Yes   Social Drivers of Health   Tobacco Use: Medium Risk (01/23/2024)   Patient History    Smoking Tobacco Use: Former    Smokeless Tobacco Use: Never    Passive Exposure: Not on Actuary Strain: Not on file  Food Insecurity: No Food Insecurity (01/13/2024)   Epic    Worried About Programme Researcher, Broadcasting/film/video in the Last Year: Never true    Ran Out of Food in the Last Year: Never true  Transportation Needs: No Transportation Needs (01/13/2024)   Epic    Lack of Transportation (Medical): No    Lack of Transportation (Non-Medical): No  Physical Activity: Not on file  Stress: Not on file  Social Connections: Unknown (01/08/2024)   Social Connection and Isolation Panel    Frequency of Communication with Friends and Family: Not on file    Frequency of Social Gatherings with Friends and Family: Three times a week    Attends Religious Services: 1 to 4 times per year    Active Member of Clubs or Organizations: Patient unable to answer    Attends Banker Meetings: Patient unable to answer    Marital Status: Widowed  Intimate Partner Violence: Not At Risk (01/13/2024)   Epic    Fear of Current or Ex-Partner: No    Emotionally Abused: No    Physically Abused: No    Sexually Abused: No  Depression (PHQ2-9): Low Risk (09/11/2022)   Depression (PHQ2-9)    PHQ-2 Score: 0  Alcohol Screen: Not on file  Housing: Unknown (01/13/2024)   Epic    Unable to Pay for Housing in the Last Year: No    Number of Times Moved in the Last Year: Not on file    Homeless in the Last Year: No  Utilities: Not At Risk (01/13/2024)   Epic    Threatened with loss of utilities: No  Health Literacy: Not on file    Past Surgical History:  Procedure Laterality Date   carpel tunnel Right  CHOLECYSTECTOMY     RHINOPLASTY     TOTAL HIP ARTHROPLASTY Right 08/12/2018   Procedure: RIGHT TOTAL HIP ARTHROPLASTY  ANTERIOR APPROACH;  Surgeon: Vernetta Lonni GRADE, MD;  Location: [facility] OR;  Service: Orthopedics;  Laterality: Right;    Family History  Problem Relation Age of Onset   Cancer Mother    Stomach cancer Mother    CAD Father    Pneumonia Sister    Breast cancer Sister    Aortic stenosis Sister    Congestive Heart Failure Sister    Gastric cancer Brother     Allergies[1]  Medications Ordered Prior to Encounter[2]  BP 138/72 (BP Location: Right Arm, Patient Position: Sitting, Cuff Size: Normal)   Pulse 68   Temp 97.6 F (36.4 C) (Oral)   Resp 14   Ht 5' (1.524 m)   Wt 118 lb (53.5 kg)   SpO2 98%   BMI 23.05 kg/m chart      Objective:    BP 138/72 (BP Location: Right Arm, Patient Position: Sitting, Cuff Size: Normal)   Pulse 68   Temp 97.6 F (36.4 C) (Oral)   Resp 14   Ht 5' (1.524 m)   Wt 118 lb (53.5 kg)   SpO2 98%   BMI 23.05 kg/m    Physical Exam Vitals and nursing note reviewed.  Constitutional:      Appearance: Normal appearance. She is normal weight.  HENT:     Right Ear: Tympanic membrane, ear canal and external ear normal.     Left Ear: Tympanic membrane, ear canal and external ear normal.     Mouth/Throat:     Mouth: Mucous membranes are moist.     Pharynx: Oropharynx is clear.  Cardiovascular:     Rate and Rhythm: Normal rate and regular rhythm.  Pulmonary:     Effort: Pulmonary effort is normal.     Breath sounds: Normal breath sounds.  Abdominal:     General: Abdomen is flat. Bowel sounds are normal.     Palpations: Abdomen is soft.  Musculoskeletal:        General: Normal range of motion.     Cervical back: Normal range of motion and neck supple.  Skin:    General: Skin is warm and dry.  Neurological:     General: No focal deficit present.     Mental Status: She is alert and oriented to person, place, and time. Mental status is at baseline.  Psychiatric:        Mood and Affect: Mood normal.        Behavior: Behavior normal.         Thought Content: Thought content normal.        Judgment: Judgment normal.    No results found for any visits on 01/23/24.      Assessment & Plan:   Problem List Items Addressed This Visit     Pulmonary hypertension, moderate to severe (HCC) (Chronic)   Relevant Orders   Basic Metabolic Panel (BMET)   CKD stage 3b, GFR 30-44 ml/min (HCC) - baseline Scr 1.35-1.65   Relevant Orders   Basic Metabolic Panel (BMET)   Other Visit Diagnoses       Hospital discharge follow-up    -  Primary   Relevant Orders   Basic Metabolic Panel (BMET)     Elevated TSH       Relevant Orders   TSH      Recheck BMP today to take a look at renal  function to be sure that it is stable.  Will also recheck thyroid  since her TSH was elevated at the hospital.  Discussed with patient and daughter flexibility of doing 300 mg of gabapentin  at night when she is resting in bed and maybe only 100 mg during the day to prevent her from being overly drowsy.  She does have home health care in place during the day.  Will have her recheck with PCP in 4 months and sooner as needed. No orders of the defined types were placed in this encounter.   Return in about 4 months (around 05/23/2024).  Aviah Sorci B Rosy Estabrook, FNP       [1] Allergies Allergen Reactions   Penicillins Anaphylaxis and Swelling    Local swelling, throat swelling    Sulfa Antibiotics Anaphylaxis and Hives    Hives   Beta Adrenergic Blockers Other (See Comments)    bradycardia  [2] Current Outpatient Medications on File Prior to Visit  Medication Sig Dispense Refill   acetaminophen  (TYLENOL ) 650 MG CR tablet Take 1,300 mg by mouth in the morning, at noon, and at bedtime.     Calcium  Carb-Cholecalciferol (CALCIUM  600+D3 PO) Take 1 tablet by mouth daily.     cimetidine (TAGAMET) 200 MG tablet Take 200 mg by mouth daily.      ELIQUIS  2.5 MG TABS tablet TAKE 1 TABLET BY MOUTH 2 TIMES A DAY 180 tablet 1   escitalopram  (LEXAPRO ) 20 MG  tablet TAKE 1 TABLET BY MOUTH EVERY EVENING 90 tablet 1   furosemide  (LASIX ) 20 MG tablet Take 1 tablet (20 mg total) by mouth 2 (two) times daily. (Patient taking differently: Take 20 mg by mouth daily. Only take a second Lasix  if you gain 3 pounds in a day) 60 tablet 1   gabapentin  (NEURONTIN ) 100 MG capsule TAKE 2 CAPSULES BY MOUTH 3 TIMES A DAY (Patient taking differently: Take 100-200 mg by mouth See admin instructions. Take 100 mg by mouth in the morning and in the afternoon. Take 200 mg by mouth at bedtime) 180 capsule 5   hydrALAZINE  (APRESOLINE ) 100 MG tablet TAKE 1 TABLET BY MOUTH 3 TIMES DAILY 270 tablet 3   losartan  (COZAAR ) 100 MG tablet Take 1 tablet (100 mg total) by mouth daily. 90 tablet 3   Multiple Vitamin (MULTIVITAMIN) tablet Take 1 tablet by mouth daily.     Nystatin (GERHARDT'S BUTT CREAM) CREA Apply 1 Application topically 3 (three) times daily as needed for irritation. 10 each 0   Probiotic Product (PROBIOTIC DAILY PO) Take 1 capsule by mouth every evening.     rosuvastatin  (CRESTOR ) 10 MG tablet TAKE 1/2 TABLET BY MOUTH TWICE PER WEEK FOR CHOLESTEROL (Patient taking differently: Take 5 mg by mouth 2 (two) times a week.) 30 tablet 3   No current facility-administered medications on file prior to visit.

## 2024-01-24 LAB — BASIC METABOLIC PANEL WITH GFR
BUN: 26 mg/dL — ABNORMAL HIGH (ref 6–23)
CO2: 24 meq/L (ref 19–32)
Calcium: 9.1 mg/dL (ref 8.4–10.5)
Chloride: 96 meq/L (ref 96–112)
Creatinine, Ser: 1.57 mg/dL — ABNORMAL HIGH (ref 0.40–1.20)
GFR: 29.16 mL/min — ABNORMAL LOW
Glucose, Bld: 92 mg/dL (ref 70–99)
Potassium: 4.5 meq/L (ref 3.5–5.1)
Sodium: 131 meq/L — ABNORMAL LOW (ref 135–145)

## 2024-01-24 LAB — TSH: TSH: 4.12 u[IU]/mL (ref 0.35–5.50)

## 2024-01-26 ENCOUNTER — Ambulatory Visit: Payer: Self-pay | Admitting: Family

## 2024-02-08 ENCOUNTER — Other Ambulatory Visit: Payer: Self-pay | Admitting: Cardiology

## 2024-02-08 DIAGNOSIS — I4891 Unspecified atrial fibrillation: Secondary | ICD-10-CM

## 2024-02-10 NOTE — Telephone Encounter (Signed)
 Eliquis  2.5mg  refill request received. Patient is 89 years old, weight-53.5kg, Crea-1.57 on 01/23/24, Diagnosis-Afib, and last seen by Dr. Pietro on 02/13/23 and has an appt pending on 03/26/23. Dose is appropriate based on dosing criteria. Will send in refill to requested pharmacy.

## 2024-02-22 ENCOUNTER — Other Ambulatory Visit: Payer: Self-pay | Admitting: Cardiology

## 2024-02-25 NOTE — Telephone Encounter (Signed)
 Pt of Dr. Pietro. Does Dr. Pietro want to refill this RX? Please advise.

## 2024-03-25 ENCOUNTER — Ambulatory Visit: Admitting: Cardiology

## 2024-05-27 ENCOUNTER — Ambulatory Visit: Admitting: Family
